# Patient Record
Sex: Male | Born: 1960
Health system: Southern US, Community
[De-identification: ages and names within clinical notes are randomized; demographics above are authoritative.]

## PROBLEM LIST (undated history)

## (undated) DIAGNOSIS — E785 Hyperlipidemia, unspecified: Secondary | ICD-10-CM

## (undated) DIAGNOSIS — E669 Obesity, unspecified: Secondary | ICD-10-CM

## (undated) DIAGNOSIS — K219 Gastro-esophageal reflux disease without esophagitis: Secondary | ICD-10-CM

## (undated) DIAGNOSIS — I10 Essential (primary) hypertension: Secondary | ICD-10-CM

## (undated) DIAGNOSIS — M779 Enthesopathy, unspecified: Secondary | ICD-10-CM

## (undated) DIAGNOSIS — R7303 Prediabetes: Secondary | ICD-10-CM

## (undated) DIAGNOSIS — M1990 Unspecified osteoarthritis, unspecified site: Secondary | ICD-10-CM

## (undated) DIAGNOSIS — G4730 Sleep apnea, unspecified: Secondary | ICD-10-CM

## (undated) DIAGNOSIS — C801 Malignant (primary) neoplasm, unspecified: Secondary | ICD-10-CM

## (undated) DIAGNOSIS — A630 Anogenital (venereal) warts: Secondary | ICD-10-CM

## (undated) DIAGNOSIS — C61 Malignant neoplasm of prostate: Secondary | ICD-10-CM

## (undated) HISTORY — DX: Gastro-esophageal reflux disease without esophagitis: K21.9

## (undated) HISTORY — DX: Essential (primary) hypertension: I10

## (undated) HISTORY — DX: Hyperlipidemia, unspecified: E78.5

## (undated) HISTORY — DX: Unspecified osteoarthritis, unspecified site: M19.90

## (undated) HISTORY — PX: OTHER SURGICAL HISTORY: SHX169

## (undated) HISTORY — DX: Obesity, unspecified: E66.9

## (undated) HISTORY — DX: Enthesopathy, unspecified: M77.9

## (undated) HISTORY — DX: Anogenital (venereal) warts: A63.0

---

## 2003-07-20 ENCOUNTER — Emergency Department (HOSPITAL_COMMUNITY): Admission: EM | Admit: 2003-07-20 | Discharge: 2003-07-20 | Payer: Self-pay | Admitting: Emergency Medicine

## 2003-07-20 ENCOUNTER — Encounter: Payer: Self-pay | Admitting: Emergency Medicine

## 2004-12-12 ENCOUNTER — Ambulatory Visit: Payer: Self-pay | Admitting: Family Medicine

## 2005-01-10 ENCOUNTER — Encounter: Payer: Self-pay | Admitting: Orthopedic Surgery

## 2005-01-13 ENCOUNTER — Ambulatory Visit (HOSPITAL_COMMUNITY): Admission: RE | Admit: 2005-01-13 | Discharge: 2005-01-13 | Payer: Self-pay | Admitting: General Surgery

## 2005-01-23 ENCOUNTER — Ambulatory Visit: Payer: Self-pay | Admitting: Family Medicine

## 2005-03-11 ENCOUNTER — Ambulatory Visit: Payer: Self-pay | Admitting: Family Medicine

## 2005-04-03 ENCOUNTER — Emergency Department (HOSPITAL_COMMUNITY): Admission: EM | Admit: 2005-04-03 | Discharge: 2005-04-03 | Payer: Self-pay | Admitting: Emergency Medicine

## 2005-05-20 ENCOUNTER — Ambulatory Visit: Payer: Self-pay | Admitting: Family Medicine

## 2005-06-24 ENCOUNTER — Emergency Department (HOSPITAL_COMMUNITY): Admission: EM | Admit: 2005-06-24 | Discharge: 2005-06-24 | Payer: Self-pay | Admitting: Emergency Medicine

## 2005-08-05 ENCOUNTER — Ambulatory Visit (HOSPITAL_COMMUNITY): Admission: RE | Admit: 2005-08-05 | Discharge: 2005-08-05 | Payer: Self-pay | Admitting: Family Medicine

## 2005-12-02 ENCOUNTER — Ambulatory Visit: Payer: Self-pay | Admitting: Family Medicine

## 2005-12-09 ENCOUNTER — Ambulatory Visit: Payer: Self-pay | Admitting: Family Medicine

## 2006-01-18 ENCOUNTER — Emergency Department (HOSPITAL_COMMUNITY): Admission: EM | Admit: 2006-01-18 | Discharge: 2006-01-18 | Payer: Self-pay | Admitting: Emergency Medicine

## 2006-01-19 ENCOUNTER — Ambulatory Visit: Payer: Self-pay | Admitting: Family Medicine

## 2006-04-15 ENCOUNTER — Ambulatory Visit: Payer: Self-pay | Admitting: Family Medicine

## 2007-02-03 ENCOUNTER — Ambulatory Visit: Payer: Self-pay | Admitting: Family Medicine

## 2007-02-04 ENCOUNTER — Encounter: Payer: Self-pay | Admitting: Family Medicine

## 2007-02-04 LAB — CONVERTED CEMR LAB
BUN: 17 mg/dL (ref 6–23)
Basophils Absolute: 0 10*3/uL (ref 0.0–0.1)
Basophils Relative: 1 % (ref 0–1)
CO2: 21 meq/L (ref 19–32)
Calcium: 9.3 mg/dL (ref 8.4–10.5)
Chloride: 105 meq/L (ref 96–112)
Cholesterol: 178 mg/dL (ref 0–200)
Creatinine, Ser: 1.16 mg/dL (ref 0.40–1.50)
Eosinophils Absolute: 0.1 10*3/uL (ref 0.0–0.7)
Eosinophils Relative: 1 % (ref 0–5)
Glucose, Bld: 105 mg/dL — ABNORMAL HIGH (ref 70–99)
HCT: 45.7 % (ref 39.0–52.0)
HDL: 35 mg/dL — ABNORMAL LOW (ref >39)
Hemoglobin: 15.5 g/dL (ref 13.0–17.0)
LDL Cholesterol: 117 mg/dL — ABNORMAL HIGH (ref 0–99)
Lymphocytes Relative: 58 % — ABNORMAL HIGH (ref 12–46)
Lymphs Abs: 3.6 10*3/uL — ABNORMAL HIGH (ref 0.7–3.3)
MCHC: 33.9 g/dL (ref 30.0–36.0)
MCV: 94.8 fL (ref 78.0–100.0)
Monocytes Absolute: 0.6 10*3/uL (ref 0.2–0.7)
Monocytes Relative: 10 % (ref 3–11)
Neutro Abs: 2 10*3/uL (ref 1.7–7.7)
Neutrophils Relative %: 31 % — ABNORMAL LOW (ref 43–77)
Platelets: 210 10*3/uL (ref 150–400)
Potassium: 4.3 meq/L (ref 3.5–5.3)
RBC: 4.82 M/uL (ref 4.22–5.81)
RDW: 13 % (ref 11.5–14.0)
Sodium: 141 meq/L (ref 135–145)
Total CHOL/HDL Ratio: 5.1
Triglycerides: 131 mg/dL (ref <150)
VLDL: 26 mg/dL (ref 0–40)
WBC: 6.3 10*3/uL (ref 4.0–10.5)

## 2007-05-14 ENCOUNTER — Emergency Department (HOSPITAL_COMMUNITY): Admission: EM | Admit: 2007-05-14 | Discharge: 2007-05-14 | Payer: Self-pay | Admitting: Emergency Medicine

## 2008-04-27 ENCOUNTER — Ambulatory Visit: Payer: Self-pay | Admitting: Family Medicine

## 2008-04-27 LAB — CONVERTED CEMR LAB
BUN: 20 mg/dL (ref 6–23)
Basophils Absolute: 0 10*3/uL (ref 0.0–0.1)
Basophils Relative: 1 % (ref 0–1)
CO2: 23 meq/L (ref 19–32)
Calcium: 9.2 mg/dL (ref 8.4–10.5)
Chloride: 105 meq/L (ref 96–112)
Cholesterol: 196 mg/dL (ref 0–200)
Creatinine, Ser: 1.18 mg/dL (ref 0.40–1.50)
Eosinophils Absolute: 0 10*3/uL (ref 0.0–0.7)
Eosinophils Relative: 1 % (ref 0–5)
Glucose, Bld: 91 mg/dL (ref 70–99)
HCT: 46.2 % (ref 39.0–52.0)
HDL: 36 mg/dL — ABNORMAL LOW (ref >39)
Hemoglobin: 15.3 g/dL (ref 13.0–17.0)
LDL Cholesterol: 135 mg/dL — ABNORMAL HIGH (ref 0–99)
Lymphocytes Relative: 50 % — ABNORMAL HIGH (ref 12–46)
Lymphs Abs: 3.3 10*3/uL (ref 0.7–4.0)
MCHC: 33.1 g/dL (ref 30.0–36.0)
MCV: 95.1 fL (ref 78.0–100.0)
Monocytes Absolute: 0.7 10*3/uL (ref 0.1–1.0)
Monocytes Relative: 10 % (ref 3–12)
Neutro Abs: 2.5 10*3/uL (ref 1.7–7.7)
Neutrophils Relative %: 39 % — ABNORMAL LOW (ref 43–77)
PSA: 3.46 ng/mL (ref 0.10–4.00)
Platelets: 229 10*3/uL (ref 150–400)
Potassium: 4.2 meq/L (ref 3.5–5.3)
RBC: 4.86 M/uL (ref 4.22–5.81)
RDW: 13.3 % (ref 11.5–15.5)
Sodium: 141 meq/L (ref 135–145)
Total CHOL/HDL Ratio: 5.4
Triglycerides: 125 mg/dL (ref <150)
VLDL: 25 mg/dL (ref 0–40)
WBC: 6.5 10*3/uL (ref 4.0–10.5)

## 2008-05-02 DIAGNOSIS — A630 Anogenital (venereal) warts: Secondary | ICD-10-CM | POA: Insufficient documentation

## 2008-05-02 DIAGNOSIS — M65839 Other synovitis and tenosynovitis, unspecified forearm: Secondary | ICD-10-CM | POA: Insufficient documentation

## 2008-05-02 DIAGNOSIS — E669 Obesity, unspecified: Secondary | ICD-10-CM | POA: Insufficient documentation

## 2008-05-02 DIAGNOSIS — E785 Hyperlipidemia, unspecified: Secondary | ICD-10-CM | POA: Insufficient documentation

## 2008-05-02 DIAGNOSIS — K219 Gastro-esophageal reflux disease without esophagitis: Secondary | ICD-10-CM | POA: Insufficient documentation

## 2008-05-02 DIAGNOSIS — M65849 Other synovitis and tenosynovitis, unspecified hand: Secondary | ICD-10-CM

## 2008-05-20 ENCOUNTER — Emergency Department (HOSPITAL_COMMUNITY): Admission: EM | Admit: 2008-05-20 | Discharge: 2008-05-20 | Payer: Self-pay | Admitting: Emergency Medicine

## 2008-05-22 ENCOUNTER — Ambulatory Visit: Payer: Self-pay | Admitting: Family Medicine

## 2008-05-24 ENCOUNTER — Encounter: Payer: Self-pay | Admitting: Orthopedic Surgery

## 2008-09-17 ENCOUNTER — Ambulatory Visit: Payer: Self-pay | Admitting: Family Medicine

## 2008-09-17 DIAGNOSIS — R0789 Other chest pain: Secondary | ICD-10-CM | POA: Insufficient documentation

## 2008-09-17 DIAGNOSIS — H547 Unspecified visual loss: Secondary | ICD-10-CM | POA: Insufficient documentation

## 2008-09-18 ENCOUNTER — Encounter: Payer: Self-pay | Admitting: Family Medicine

## 2008-09-24 DIAGNOSIS — R079 Chest pain, unspecified: Secondary | ICD-10-CM | POA: Insufficient documentation

## 2008-10-03 ENCOUNTER — Encounter: Payer: Self-pay | Admitting: Family Medicine

## 2008-12-12 ENCOUNTER — Ambulatory Visit: Payer: Self-pay | Admitting: Family Medicine

## 2008-12-27 ENCOUNTER — Ambulatory Visit: Payer: Self-pay | Admitting: Family Medicine

## 2008-12-27 DIAGNOSIS — R51 Headache: Secondary | ICD-10-CM | POA: Insufficient documentation

## 2008-12-27 DIAGNOSIS — R519 Headache, unspecified: Secondary | ICD-10-CM | POA: Insufficient documentation

## 2009-01-02 ENCOUNTER — Encounter: Payer: Self-pay | Admitting: Family Medicine

## 2009-01-12 ENCOUNTER — Emergency Department (HOSPITAL_COMMUNITY): Admission: EM | Admit: 2009-01-12 | Discharge: 2009-01-12 | Payer: Self-pay | Admitting: Emergency Medicine

## 2009-01-16 ENCOUNTER — Ambulatory Visit: Payer: Self-pay | Admitting: Family Medicine

## 2009-01-16 ENCOUNTER — Ambulatory Visit: Payer: Self-pay | Admitting: Orthopedic Surgery

## 2009-01-16 DIAGNOSIS — M25549 Pain in joints of unspecified hand: Secondary | ICD-10-CM | POA: Insufficient documentation

## 2009-01-16 DIAGNOSIS — R1031 Right lower quadrant pain: Secondary | ICD-10-CM | POA: Insufficient documentation

## 2009-01-17 ENCOUNTER — Encounter: Payer: Self-pay | Admitting: Orthopedic Surgery

## 2009-01-18 ENCOUNTER — Ambulatory Visit: Payer: Self-pay | Admitting: Orthopedic Surgery

## 2009-01-22 ENCOUNTER — Telehealth: Payer: Self-pay | Admitting: Orthopedic Surgery

## 2009-01-31 ENCOUNTER — Ambulatory Visit: Payer: Self-pay | Admitting: Orthopedic Surgery

## 2009-03-20 ENCOUNTER — Ambulatory Visit: Payer: Self-pay | Admitting: Orthopedic Surgery

## 2009-05-24 ENCOUNTER — Ambulatory Visit: Payer: Self-pay | Admitting: Family Medicine

## 2009-07-01 ENCOUNTER — Ambulatory Visit: Payer: Self-pay | Admitting: Family Medicine

## 2009-07-02 ENCOUNTER — Encounter: Payer: Self-pay | Admitting: Family Medicine

## 2009-07-02 LAB — CONVERTED CEMR LAB
ALT: 29 units/L (ref 0–53)
AST: 22 units/L (ref 0–37)
Albumin: 4.5 g/dL (ref 3.5–5.2)
Alkaline Phosphatase: 65 units/L (ref 39–117)
BUN: 14 mg/dL (ref 6–23)
Basophils Absolute: 0 10*3/uL (ref 0.0–0.1)
Basophils Relative: 0 % (ref 0–1)
Bilirubin, Direct: 0.2 mg/dL (ref 0.0–0.3)
CO2: 25 meq/L (ref 19–32)
Calcium: 10 mg/dL (ref 8.4–10.5)
Chloride: 105 meq/L (ref 96–112)
Cholesterol: 172 mg/dL (ref 0–200)
Creatinine, Ser: 1 mg/dL (ref 0.40–1.50)
Eosinophils Absolute: 0 10*3/uL (ref 0.0–0.7)
Eosinophils Relative: 0 % (ref 0–5)
Glucose, Bld: 87 mg/dL (ref 70–99)
HCT: 45.9 % (ref 39.0–52.0)
HDL: 37 mg/dL — ABNORMAL LOW (ref >39)
Hemoglobin: 15.4 g/dL (ref 13.0–17.0)
Indirect Bilirubin: 0.6 mg/dL (ref 0.0–0.9)
LDL Cholesterol: 93 mg/dL (ref 0–99)
Lymphocytes Relative: 48 % — ABNORMAL HIGH (ref 12–46)
Lymphs Abs: 3.4 10*3/uL (ref 0.7–4.0)
MCHC: 33.6 g/dL (ref 30.0–36.0)
MCV: 95.4 fL (ref 78.0–100.0)
Monocytes Absolute: 0.7 10*3/uL (ref 0.1–1.0)
Monocytes Relative: 11 % (ref 3–12)
Neutro Abs: 2.8 10*3/uL (ref 1.7–7.7)
Neutrophils Relative %: 41 % — ABNORMAL LOW (ref 43–77)
PSA: 3.49 ng/mL (ref 0.10–4.00)
Platelets: 224 10*3/uL (ref 150–400)
Potassium: 4.8 meq/L (ref 3.5–5.3)
RBC: 4.81 M/uL (ref 4.22–5.81)
RDW: 13.4 % (ref 11.5–15.5)
Sodium: 143 meq/L (ref 135–145)
TSH: 1.344 microintl units/mL (ref 0.350–4.500)
Total Bilirubin: 0.8 mg/dL (ref 0.3–1.2)
Total CHOL/HDL Ratio: 4.6
Total Protein: 7.2 g/dL (ref 6.0–8.3)
Triglycerides: 209 mg/dL — ABNORMAL HIGH (ref <150)
VLDL: 42 mg/dL — ABNORMAL HIGH (ref 0–40)
WBC: 7 10*3/uL (ref 4.0–10.5)

## 2009-07-08 DIAGNOSIS — K5289 Other specified noninfective gastroenteritis and colitis: Secondary | ICD-10-CM | POA: Insufficient documentation

## 2009-07-22 ENCOUNTER — Ambulatory Visit: Payer: Self-pay | Admitting: Orthopedic Surgery

## 2009-07-22 DIAGNOSIS — M79609 Pain in unspecified limb: Secondary | ICD-10-CM | POA: Insufficient documentation

## 2009-07-22 DIAGNOSIS — M722 Plantar fascial fibromatosis: Secondary | ICD-10-CM | POA: Insufficient documentation

## 2009-10-03 ENCOUNTER — Ambulatory Visit: Payer: Self-pay | Admitting: Family Medicine

## 2009-10-03 LAB — CONVERTED CEMR LAB: Creatinine, Ser: 1.05 mg/dL (ref 0.40–1.50)

## 2009-10-14 ENCOUNTER — Ambulatory Visit: Payer: Self-pay | Admitting: Orthopedic Surgery

## 2009-10-23 ENCOUNTER — Encounter (INDEPENDENT_AMBULATORY_CARE_PROVIDER_SITE_OTHER): Payer: Self-pay | Admitting: *Deleted

## 2009-11-14 ENCOUNTER — Ambulatory Visit: Payer: Self-pay | Admitting: Internal Medicine

## 2009-11-19 ENCOUNTER — Ambulatory Visit (HOSPITAL_COMMUNITY): Admission: RE | Admit: 2009-11-19 | Discharge: 2009-11-19 | Payer: Self-pay | Admitting: Internal Medicine

## 2009-11-21 ENCOUNTER — Encounter (INDEPENDENT_AMBULATORY_CARE_PROVIDER_SITE_OTHER): Payer: Self-pay | Admitting: *Deleted

## 2009-11-25 ENCOUNTER — Encounter: Payer: Self-pay | Admitting: Internal Medicine

## 2009-11-27 ENCOUNTER — Ambulatory Visit (HOSPITAL_COMMUNITY): Admission: RE | Admit: 2009-11-27 | Discharge: 2009-11-27 | Payer: Self-pay | Admitting: Internal Medicine

## 2009-11-27 ENCOUNTER — Ambulatory Visit: Payer: Self-pay | Admitting: Internal Medicine

## 2009-11-29 ENCOUNTER — Encounter: Payer: Self-pay | Admitting: Internal Medicine

## 2009-12-03 ENCOUNTER — Encounter: Payer: Self-pay | Admitting: Internal Medicine

## 2009-12-10 ENCOUNTER — Encounter: Payer: Self-pay | Admitting: Internal Medicine

## 2009-12-13 ENCOUNTER — Ambulatory Visit (HOSPITAL_COMMUNITY): Admission: RE | Admit: 2009-12-13 | Discharge: 2009-12-13 | Payer: Self-pay | Admitting: General Surgery

## 2009-12-13 HISTORY — PX: APPENDECTOMY: SHX54

## 2010-02-03 ENCOUNTER — Ambulatory Visit: Payer: Self-pay | Admitting: Family Medicine

## 2010-02-03 DIAGNOSIS — R5383 Other fatigue: Secondary | ICD-10-CM

## 2010-02-03 DIAGNOSIS — R5381 Other malaise: Secondary | ICD-10-CM | POA: Insufficient documentation

## 2010-04-14 ENCOUNTER — Telehealth: Payer: Self-pay | Admitting: Family Medicine

## 2010-05-19 ENCOUNTER — Telehealth: Payer: Self-pay | Admitting: Family Medicine

## 2010-05-21 ENCOUNTER — Encounter: Payer: Self-pay | Admitting: Orthopedic Surgery

## 2010-06-25 ENCOUNTER — Encounter: Payer: Self-pay | Admitting: Family Medicine

## 2010-06-25 ENCOUNTER — Emergency Department (HOSPITAL_COMMUNITY): Admission: EM | Admit: 2010-06-25 | Discharge: 2010-06-25 | Payer: Self-pay | Admitting: Emergency Medicine

## 2010-07-01 ENCOUNTER — Ambulatory Visit: Payer: Self-pay | Admitting: Family Medicine

## 2010-07-01 DIAGNOSIS — M549 Dorsalgia, unspecified: Secondary | ICD-10-CM | POA: Insufficient documentation

## 2010-07-02 LAB — CONVERTED CEMR LAB
BUN: 18 mg/dL (ref 6–23)
Basophils Absolute: 0 K/uL (ref 0.0–0.1)
Basophils Relative: 1 % (ref 0–1)
CO2: 27 meq/L (ref 19–32)
Calcium: 9 mg/dL (ref 8.4–10.5)
Chloride: 105 meq/L (ref 96–112)
Cholesterol: 177 mg/dL (ref 0–200)
Creatinine, Ser: 1.05 mg/dL (ref 0.40–1.50)
Eosinophils Absolute: 0 K/uL (ref 0.0–0.7)
Eosinophils Relative: 1 % (ref 0–5)
Glucose, Bld: 97 mg/dL (ref 70–99)
HCT: 42.9 % (ref 39.0–52.0)
HDL: 33 mg/dL — ABNORMAL LOW (ref >39)
Hemoglobin: 14.5 g/dL (ref 13.0–17.0)
LDL Cholesterol: 124 mg/dL — ABNORMAL HIGH (ref 0–99)
Lymphocytes Relative: 59 % — ABNORMAL HIGH (ref 12–46)
Lymphs Abs: 2.7 K/uL (ref 0.7–4.0)
MCHC: 33.8 g/dL (ref 30.0–36.0)
MCV: 95.5 fL (ref 78.0–100.0)
Monocytes Absolute: 0.5 K/uL (ref 0.1–1.0)
Monocytes Relative: 11 % (ref 3–12)
Neutro Abs: 1.3 K/uL — ABNORMAL LOW (ref 1.7–7.7)
Neutrophils Relative %: 29 % — ABNORMAL LOW (ref 43–77)
PSA: 3.24 ng/mL (ref 0.10–4.00)
Platelets: 228 K/uL (ref 150–400)
Potassium: 4.3 meq/L (ref 3.5–5.3)
RBC: 4.49 M/uL (ref 4.22–5.81)
RDW: 12.9 % (ref 11.5–15.5)
Sodium: 142 meq/L (ref 135–145)
TSH: 0.81 u[IU]/mL (ref 0.350–4.500)
Total CHOL/HDL Ratio: 5.4
Triglycerides: 101 mg/dL (ref <150)
VLDL: 20 mg/dL (ref 0–40)
Vit D, 25-Hydroxy: 20 ng/mL — ABNORMAL LOW (ref 30–89)
WBC: 4.5 10*3/microliter (ref 4.0–10.5)

## 2010-07-03 ENCOUNTER — Telehealth: Payer: Self-pay | Admitting: Family Medicine

## 2010-08-05 ENCOUNTER — Emergency Department (HOSPITAL_COMMUNITY): Admission: EM | Admit: 2010-08-05 | Discharge: 2010-08-05 | Payer: Self-pay | Admitting: Emergency Medicine

## 2010-08-06 ENCOUNTER — Telehealth (INDEPENDENT_AMBULATORY_CARE_PROVIDER_SITE_OTHER): Payer: Self-pay | Admitting: *Deleted

## 2010-08-12 ENCOUNTER — Encounter: Payer: Self-pay | Admitting: Family Medicine

## 2010-08-15 ENCOUNTER — Encounter: Payer: Self-pay | Admitting: Family Medicine

## 2010-08-15 HISTORY — PX: CHOLECYSTECTOMY: SHX55

## 2010-08-19 ENCOUNTER — Ambulatory Visit: Payer: Self-pay | Admitting: Family Medicine

## 2010-11-23 ENCOUNTER — Encounter: Payer: Self-pay | Admitting: Family Medicine

## 2010-11-30 LAB — CONVERTED CEMR LAB
Cholesterol: 175 mg/dL (ref 0–200)
HDL: 31 mg/dL — ABNORMAL LOW (ref >39)
LDL Cholesterol: 110 mg/dL — ABNORMAL HIGH (ref 0–99)
Total CHOL/HDL Ratio: 5.6
Triglycerides: 170 mg/dL — ABNORMAL HIGH (ref <150)
VLDL: 34 mg/dL (ref 0–40)

## 2010-12-02 NOTE — Assessment & Plan Note (Signed)
Summary: ABD PAIN/SS   Visit Type:  Initial Consult Primary Care Provider:  simpson  Chief Complaint:  abd pain x several months, no N/V, and no diarrhea/constipation.  History of Present Illness: 50 year old African American male referred her to further evaluate a six-month history of right lower quadrant abdominal pain. It waxes and wanes; there is no radiating component. He may awaken with it the morning - May last couple of hours and then it settled down;  not associated with bowel function or eating. There is no positional component. He's had no nausea or vomiting. No associated flank pain or urinary tract symptoms. He has 1-3 formed bowel movement is daily denies melena or hematochezia. He has not lost any weight.  He has a greater than 10 year history of chronic gastroesophageal reflux  disease symptoms; he describes heartburn. He tells me he was started on Prevacid which has been changed to omeprazole, with notabley,  marked improvement in his symptoms. He does not have any alarm symptoms associated with reflux including odynophagia or dysphagia. There is no family history colon cancer. He tells me his father said multiple colonoscopy for colonic polyps beginning at about age 56; he has never had his lower GI tract tract evaluated. He has a history of condyloma acuminata (perianal);  treated by  Dr. Suan Halter. He has had normal hemoglobin and total white count  through Dr. Anthony Sar office although he did have a slightly elevated lymphocyte count on a couple of occasions.  Current Problems (verified): 1)  Foot Pain, Left  (ICD-729.5) 2)  Plantar Faciitis  (ICD-728.71) 3)  Gastroenteritis  (ICD-558.9) 4)  Abdominal Pain Right Lower Quadrant  (ICD-789.03) 5)  Wrist Pain  (ICD-719.44) 6)  Family History of Diabetes  (ICD-V18.0) 7)  Headache  (ICD-784.0) 8)  Chest Pain Unspecified  (ICD-786.50) 9)  Visual Acuity, Decreased  (ICD-369.9) 10)  Other Chest Pain  (ICD-786.59) 11)  Condyloma  Acuminata  (ICD-078.11) 12)  Gerd  (ICD-530.81) 13)  Hyperlipidemia  (ICD-272.4) 14)  Obesity  (ICD-278.00) 15)  Tendinitis, Left Hand  (ICD-727.05)  Current Medications (verified): 1)  Adult Aspirin Ec Low Strength 81 Mg Tbec (Aspirin) .... Take 1 Tablet By Mouth Once A Day 2)  Vitamin C 500 Mg Tabs (Ascorbic Acid) .... Take 1 Tablet By Mouth Once A Day 3)  Centrum Ultra Mens  Tabs (Multiple Vitamins-Minerals) .... Take 1 Tablet By Mouth Once A Day 4)  Dicyclomine Hcl 20 Mg Tabs (Dicyclomine Hcl) .... Take 1 Tablet By Mouth Two Times A Day As Needed 5)  Omeprazole 20 Mg Cpdr (Omeprazole) .... Take 1 Capsule By Mouth Once A Day 6)  Ultracet 37.5-325 Mg Tabs (Tramadol-Acetaminophen) .Marland Kitchen.. 1 By Mouth Q 4 As Needed  Allergies (verified): No Known Drug Allergies  Past History:  Past Medical History: Last updated: 05/02/2008 Current Problems:  CONDYLOMA ACUMINATA (ICD-078.11) GERD (ICD-530.81) HYPERLIPIDEMIA (ICD-272.4) OBESITY (ICD-278.00) TENDINITIS, LEFT HAND (ICD-727.05)  Past Surgical History: Last updated: 01/16/2009 CTR right hand  Family History: Last updated: 01/16/2009 Mother living - DM, HTN Father living - reflux dz Five sisters living - x1 DM 2 brothers living - healthy FH of Cancer:  Family History of Diabetes  Social History: Last updated: 01/16/2009 Employed [employer] Single Never Smoked Alcohol use-occasional Drug use-no  Risk Factors: Caffeine Use: 0 (01/16/2009)  Risk Factors: Smoking Status: never (01/16/2009)  Vital Signs:  Patient profile:   50 year old male Height:      67 inches Weight:  217 pounds BMI:     34.11 Temp:     98.2 degrees F oral Pulse rate:   80 / minute BP sitting:   112 / 80  (left arm) Cuff size:   regular  Vitals Entered By: Hendricks Limes LPN (November 14, 2009 3:06 PM)  Physical Exam  General:  alert conversant and in no acute distress Lungs:  clear to auscultation Heart:  regular rate rhythm without  murmur gallop rub Abdomen:  nondistended positive bowel sounds soft there is some tenderness to deep right lower quadrant to palpation do not appreciate any mass there is no guarding or rebound I didn't appreciate any organomegaly or hernia. Back has no CVA tenderness Rectal:  no perianal lesions good sphincter tone and scant brown stool in rectal vault no mass Hemoccult negative  Impression & Recommendations: Impression: Pleasant 50 year old gentleman with intermittent right lower quadrant pain x6 months. Pain really does not have any associations with bowel function or  oral intake. I certainly do not detect a hernia on today's examination. Differential diagnosis remains somewhat broad at this point in time.  He has long-standing gastroesophageal reflux disease symptoms are well controlled on acid suppression therapy.  Recommendations: Contrast CT of the abdomen and pelvis to further evaluate right lower quadrant abdominal pain. Further recommendations to follow.  Depending on findings of CT, I will likely also the patient a screening colonoscopy as well as EGD given a longer standing gastroesophageal reflux disease symptoms discussed this approach with the patient along with the associated risks, benefits, limitations, and alternatives today. His questions have been answered  Again, further recommendations to follow.  Other Orders: Consultation Level IV (84166) Hemoccult Guaiac-1 spec.(in office) (82270)

## 2010-12-02 NOTE — Progress Notes (Signed)
Summary: referral on mom  Phone Note Call from Patient   Summary of Call: called pt and left message that mother had appt with dr. Kristian Covey on 06/25/2010 11:00.  Initial call taken by: Rudene Anda,  May 19, 2010 8:16 AM

## 2010-12-02 NOTE — Progress Notes (Signed)
  Phone Note Other Incoming   Caller: dr simpson Summary of Call: pls contact Kaylon Buckner, let him know i reviewwed the scan done in the ED, he needs to a surgeon because he has gallstones, if he has abdominal pain and dyspepsia on and off, i suggest Dr Lovell Sheehan, pls set up appt with Dr Lovell Sheehan and fax over the report. He works most days so you will have to leave a message for him to call you  Initial call taken by: Syliva Overman MD,  July 03, 2010 8:40 AM  Follow-up for Phone Call        Patient aware.  faxed report and referral form reqesting appointment Follow-up by: Everitt Amber LPN,  July 03, 2010 3:38 PM

## 2010-12-02 NOTE — Assessment & Plan Note (Signed)
Summary: physical   Vital Signs:  Patient profile:   50 year old male Height:      67 inches Weight:      213.25 pounds BMI:     33.52 O2 Sat:      99 % on Room air Pulse rate:   112 / minute Pulse rhythm:   regular Resp:     16 per minute BP sitting:   122 / 76  (left arm)  Vitals Entered By: Mauricia Area CMA (August 19, 2010 2:56 PM)  Nutrition Counseling: Patient's BMI is greater than 25 and therefore counseled on weight management options.  O2 Flow:  Room air CC: physical   Primary Care Carmelo Reidel:  simpson  CC:  physical.  History of Present Illness: Reports  that he has not been doing too well. He is still experiencing fatigue since his recent appendectomy. Denies recent fever or chills. Denies sinus pressure, nasal congestion , ear pain or sore throat. Denies chest congestion, or cough productive of sputum. Denies chest pain, palpitations, PND, orthopnea or leg swelling. C/o incision site abdominal pain,denies  nausea, vomitting, diarrhea or constipation. Denies change in bowel movements or bloody stool. Denies dysuria , frequency, incontinence or hesitancy. Denies  joint pain, swelling, or reduced mobility. Denies headaches, vertigo, seizures. Denies depression, anxiety or insomnia. Denies  rash, lesions, or itch.     Current Medications (verified): 1)  Adult Aspirin Ec Low Strength 81 Mg Tbec (Aspirin) .... Take 1 Tablet By Mouth Once A Day 2)  Vitamin C 500 Mg Tabs (Ascorbic Acid) .... Take 1 Tablet By Mouth Once A Day 3)  Centrum Ultra Mens  Tabs (Multiple Vitamins-Minerals) .... Take 1 Tablet By Mouth Once A Day 4)  Omeprazole 20 Mg Cpdr (Omeprazole) .... Take 1 Capsule By Mouth Once A Day 5)  Cephalexin 500 Mg Caps (Cephalexin) .Marland Kitchen.. 1 Cap Three Times A Day 6)  Hydrocodone-Acetaminophen 5-500 Mg Tabs (Hydrocodone-Acetaminophen) .Marland Kitchen.. 1 Tab Every 4-6 Hours For Pain  Allergies (verified): No Known Drug Allergies  Past History:  Past medical, surgical,  family and social histories (including risk factors) reviewed, and no changes noted (except as noted below).  Past Medical History: Reviewed history from 05/02/2008 and no changes required. Current Problems:  CONDYLOMA ACUMINATA (ICD-078.11) GERD (ICD-530.81) HYPERLIPIDEMIA (ICD-272.4) OBESITY (ICD-278.00) TENDINITIS, LEFT HAND (ICD-727.05)  Past Surgical History: CTR right hand appendectomy 12/13/2009 cholecystectomy 08/15/2010 de [facility]  Family History: Reviewed history from 01/16/2009 and no changes required. Mother living - DM, HTN Father living - reflux dz Five sisters living - x1 DM 2 brothers living - healthy FH of Cancer:  Family History of Diabetes  Social History: Reviewed history from 01/16/2009 and no changes required. Employed Research scientist (medical) Single Never Smoked Alcohol use-occasional Drug use-no  Review of Systems      See HPI General:  Complains of fatigue, loss of appetite, and malaise. Eyes:  Denies blurring and discharge. Endo:  Denies excessive thirst and excessive urination. Heme:  Denies abnormal bruising and bleeding. Allergy:  Denies hives or rash and itching eyes.  Physical Exam  General:  Well-developed,well-nourished,in no acute distress; alert,appropriate and cooperative throughout examination HEENT: No facial asymmetry,  EOMI, No sinus tenderness, TM's Clear, oropharynx  pink and moist.   Chest: Clear to auscultation bilaterally.  CVS: S1, S2, No murmurs, No S3.   Abd: Soft, Nontender.  MS: Adequate ROM spine, hips, shoulders and knees.  Ext: No edema.   CNS: CN 2-12 intact, power tone and sensation normal throughout.  Skin: Intact, surgical incision slightly eryuthematous, no evidence of infection, no drainage, wound still healing Psych: Good eye contact, normal affect.  Memory intact, not anxious or depressed appearing.    Impression & Recommendations:  Problem # 1:  HYPERLIPIDEMIA (ICD-272.4) Assessment Comment Only  Labs  Reviewed: SGOT: 22 (07/01/2009)   SGPT: 29 (07/01/2009)   HDL:33 (07/01/2010), 37 (07/01/2009)  LDL:124 (07/01/2010), 93 (16/08/9603)  Chol:177 (07/01/2010), 172 (07/01/2009)  Trig:101 (07/01/2010), 209 (07/01/2009) Low fat dietdiscussed and encouraged  Problem # 2:  OBESITY (ICD-278.00) Assessment: Improved  Ht: 67 (08/19/2010)   Wt: 213.25 (08/19/2010)   BMI: 33.52 (08/19/2010) therapeutic lifestyle change discussed and encouraged  Problem # 3:  GERD (ICD-530.81) Assessment: Improved  His updated medication list for this problem includes:    Omeprazole 20 Mg Cpdr (Omeprazole) .Marland Kitchen... Take 1 capsule by mouth once a day  Complete Medication List: 1)  Adult Aspirin Ec Low Strength 81 Mg Tbec (Aspirin) .... Take 1 tablet by mouth once a day 2)  Vitamin C 500 Mg Tabs (Ascorbic acid) .... Take 1 tablet by mouth once a day 3)  Centrum Ultra Mens Tabs (Multiple vitamins-minerals) .... Take 1 tablet by mouth once a day 4)  Omeprazole 20 Mg Cpdr (Omeprazole) .... Take 1 capsule by mouth once a day 5)  Cephalexin 500 Mg Caps (Cephalexin) .Marland Kitchen.. 1 cap three times a day 6)  Hydrocodone-acetaminophen 5-500 Mg Tabs (Hydrocodone-acetaminophen) .Marland Kitchen.. 1 tab every 4-6 hours for pain  Other Orders: Tdap => 55yrs IM (54098) Admin 1st Vaccine (11914)  Patient Instructions: 1)  CPE in 4.5 months. 2)  Pls complete the antibiotic course as per Dr Gabriel Cirri, also you can take the pain meds as needed, should be reducing the dose. 3)  Fluidsas much as tolerated, increase food as tolerated, increase activity.   Orders Added: 1)  Est. Patient Level III [78295] 2)  Tdap => 52yrs IM [90715] 3)  Admin 1st Vaccine [62130]   Immunizations Administered:  Tetanus Vaccine:    Vaccine Type: Tdap    Site: left deltoid    Mfr: GlaxoSmithKline    Dose: 0.5 ml    Route: IM    Given by: Adella Hare LPN    Exp. Date: 08/21/2012    Lot #: [id number]    VIS given: 09/20/07 version given August 19, 2010.   Immunizations Administered:  Tetanus Vaccine:    Vaccine Type: Tdap    Site: left deltoid    Mfr: GlaxoSmithKline    Dose: 0.5 ml    Route: IM    Given by: Adella Hare LPN    Exp. Date: 08/21/2012    Lot #: [id number]    VIS given: 09/20/07 version given August 19, 2010.

## 2010-12-02 NOTE — Letter (Signed)
Summary: CT ABD/PELVIS ORDER  CT ABD/PELVIS ORDER   Imported By: Ave Filter 11/14/2009 17:16:29  _____________________________________________________________________  External Attachment:    Type:   Image     Comment:   External Document

## 2010-12-02 NOTE — Op Note (Signed)
Summary: Operative Report RT wrist Ganglion cyst  Operative Report RT wrist Ganglion cyst   Imported By: Cammie Sickle 05/27/2010 12:17:54  _____________________________________________________________________  External Attachment:    Type:   Image     Comment:   External Document

## 2010-12-02 NOTE — Letter (Signed)
Summary: Out of Work Note  [facility]  [address]   [place], [place] [address]   Phone: [phone]  Fax: [phone]    11/21/2009  TO: Leodis Sias IT MAY CONCERN  RE: Anthony Erickson [address] [place] [birth year]       The above named individual is currently under my care and will be out of work    FROM: 11/26/2009   THROUGH: 11/28/2009      REASON: Will be unable to work.    MAY RETURN ON: 11/29/2009     If you have any further questions or need additional information, please call.     Sincerely,     [facility] R. Roetta Sessions, M.D.    Kassie Mends, M.D. Lorenza Burton, FNP-BC    Tana Coast, PA-C Phone: [phone]    Fax: [phone]

## 2010-12-02 NOTE — Assessment & Plan Note (Signed)
Summary: F UP FROM ED BACK   Vital Signs:  Patient profile:   50 year old male Height:      67 inches Weight:      221.50 pounds BMI:     34.82 O2 Sat:      99 % on Room air Pulse rate:   76 / minute Pulse rhythm:   regular Resp:     16 per minute BP sitting:   112 / 80  (left arm)  Vitals Entered By: Adella Hare LPN (July 01, 2010 11:11 AM)  Nutrition Counseling: Patient's BMI is greater than 25 and therefore counseled on weight management options.  O2 Flow:  Room air CC: er follow up back pain Is Patient Diabetic? No Pain Assessment Patient in pain? yes     Location: back Intensity: 7 Type: aching Onset of pain  Constant Comments did not bring meds to ov   Primary Care Provider:  simpson  CC:  er follow up back pain.  History of Present Illness: Intermittent left lumbar pain x 2 months. Can't get comfortable at night in the bed, has  to sleep on left, sitting is uncomfortable, most comfoertable in a recliner, sytanding and walking less painful.Bending and twisting aggravate the pain. Sitting in a chair the pain is a 7. No relief noted. Was in the Ed 1 week ago, did not fill the script, states pain is a 7 and has been really bad in the last 3 days  rx was for ibuoprofen 400 to 600mg  3 times daily,hydrocodne 5/325 1 every 4 to 6 hr, valim but will d/c this, and replace with flexeril. Reports  that he is otherwise doing well. Denies recent fever or chills. Denies sinus pressure, nasal congestion , ear pain or sore throat. Denies chest congestion, or cough productive of sputum. Denies chest pain, palpitations, PND, orthopnea or leg swelling. Denies abdominal pain, nausea, vomitting, diarrhea or constipation. Denies change in bowel movements or bloody stool. Denies dysuria , frequency, incontinence or hesitancy.  Denies headaches, vertigo, seizures. Denies depression, anxiety or insomnia. Denies  rash, lesions, or itch.     Current Medications (verified): 1)   Adult Aspirin Ec Low Strength 81 Mg Tbec (Aspirin) .... Take 1 Tablet By Mouth Once A Day 2)  Vitamin C 500 Mg Tabs (Ascorbic Acid) .... Take 1 Tablet By Mouth Once A Day 3)  Centrum Ultra Mens  Tabs (Multiple Vitamins-Minerals) .... Take 1 Tablet By Mouth Once A Day 4)  Omeprazole 20 Mg Cpdr (Omeprazole) .... Take 1 Capsule By Mouth Once A Day  Allergies (verified): No Known Drug Allergies  Review of Systems      See HPI General:  Complains of fatigue. Eyes:  Denies blurring and discharge. Endo:  Denies excessive thirst and excessive urination. Heme:  Denies abnormal bruising and bleeding. Allergy:  Denies hives or rash and itching eyes.  Physical Exam  General:  alert, well-hydrated, and overweight-appearing.  HEENT: No facial asymmetry,  EOMI, No sinus tenderness, TM's Clear, oropharynx  pink and moist.   Chest: Clear to auscultation bilaterally.  CVS: S1, S2, No murmurs, No S3.   Abd: sopft , nonm tender MS: decreased  ROM thoracolumbar spine,adequate  in  hips, shoulders and knees.  Ext: No edema.   CNS: CN 2-12 intact, power tone and sensation normal throughout.   Skin: Intact, no visible lesions or rashes.  Psych: Good eye contact, normal affect.  Memory intact, not anxious or depressed appearing.     Impression &  Recommendations:  Problem # 1:  BACK PAIN (ICD-724.5) Assessment Deteriorated  The following medications were removed from the medication list:    Ultracet 37.5-325 Mg Tabs (Tramadol-acetaminophen) .Marland Kitchen... 1 by mouth q 4 as needed His updated medication list for this problem includes:    Adult Aspirin Ec Low Strength 81 Mg Tbec (Aspirin) .Marland Kitchen... Take 1 tablet by mouth once a day    Cyclobenzaprine Hcl 10 Mg Tabs (Cyclobenzaprine hcl) .Marland Kitchen... Take 1 tablet by mouth three times a day as needed  Orders: Physical Therapy Referral (PT) Depo- Medrol 80mg  (J1040) Ketorolac-Toradol 15mg  (E4540) Admin of Therapeutic Inj  intramuscular or subcutaneous  (98119)  Problem # 2:  HYPERLIPIDEMIA (ICD-272.4) Assessment: Comment Only  Labs Reviewed: SGOT: 22 (07/01/2009)   SGPT: 29 (07/01/2009)   HDL:37 (07/01/2009), 31 (09/18/2008)  LDL:93 (07/01/2009), 110 (14/78/2956)  Chol:172 (07/01/2009), 175 (09/18/2008)  Trig:209 (07/01/2009), 170 (09/18/2008) low fat diet discussed and encouraged  Problem # 3:  OBESITY (ICD-278.00) Assessment: Unchanged  Ht: 67 (07/01/2010)   Wt: 221.50 (07/01/2010)   BMI: 34.82 (07/01/2010)  Complete Medication List: 1)  Adult Aspirin Ec Low Strength 81 Mg Tbec (Aspirin) .... Take 1 tablet by mouth once a day 2)  Vitamin C 500 Mg Tabs (Ascorbic acid) .... Take 1 tablet by mouth once a day 3)  Centrum Ultra Mens Tabs (Multiple vitamins-minerals) .... Take 1 tablet by mouth once a day 4)  Omeprazole 20 Mg Cpdr (Omeprazole) .... Take 1 capsule by mouth once a day 5)  Cyclobenzaprine Hcl 10 Mg Tabs (Cyclobenzaprine hcl) .... Take 1 tablet by mouth three times a day as needed  Patient Instructions: 1)  F/U as before. 2)  Pls taske prescriptions per the Ed.(except for the valium) 3)  you will benefit from physical therapy and I will refer you as discussed. 4)  No valium instead cycobenzaprine, be careful of sedation Prescriptions: CYCLOBENZAPRINE HCL 10 MG TABS (CYCLOBENZAPRINE HCL) Take 1 tablet by mouth three times a day as needed  #30 x 0   Entered and Authorized by:   Syliva Overman MD   Signed by:   Syliva Overman MD on 07/01/2010   Method used:   Printed then faxed to ...       [facility] (retail)       [address]       [place], [place]  [address]       Ph: [phone]       Fax: [phone]   RxID:   [id number]    Medication Administration  Injection # 1:    Medication: Depo- Medrol 80mg     Diagnosis: BACK PAIN (ICD-724.5)    Route: IM    Site: RUOQ gluteus    Exp Date: 4/12    Lot #: [id number]    Mfr: [employer]    Patient tolerated  injection without complications    Given by: Adella Hare LPN (July 01, 2010 11:48 AM)  Injection # 2:    Medication: Ketorolac-Toradol 15mg     Diagnosis: BACK PAIN (ICD-724.5)    Route: IM    Site: LUOQ gluteus    Exp Date: 01/01/2012    Lot #: [id number]    Mfr: [email or web address]    Comments: toradol 60mg  given    Patient tolerated injection without complications    Given by: Adella Hare LPN (July 01, 2010 11:49 AM)  Orders Added: 1)  Est. Patient Level IV [75643] 2)  Physical Therapy Referral [PT] 3)  Depo- Medrol 80mg  [J1040] 4)  Ketorolac-Toradol 15mg  [J1885] 5)  Admin of Therapeutic Inj  intramuscular or subcutaneous [16109]

## 2010-12-02 NOTE — Assessment & Plan Note (Signed)
Summary: office visit   Vital Signs:  Patient profile:   50 year old male Height:      67 inches Weight:      220.75 pounds BMI:     34.70 O2 Sat:      99 % Pulse rate:   87 / minute Pulse rhythm:   regular Resp:     16 per minute BP sitting:   114 / 74  (left arm) Cuff size:   large  Vitals Entered By: Everitt Amber LPN (February 03, 1609 4:30 PM)  Nutrition Counseling: Patient's BMI is greater than 25 and therefore counseled on weight management options. CC: Follow up chronic problems   Primary Care Provider:  Orva Gwaltney  CC:  Follow up chronic problems.  History of Present Illness: pt  is concerned about small renal cysts and wants this fololowed , he is otherwise doing well. Heis concerned about weight gain and intends to lose this by lifestyle change. since his last visit he had appendectome and reports resolution of his pain.   Denies recent fever or chills. Denies sinus pressure, nasal congestion , ear pain or sore throat. Denies chest congestion, or cough productive of sputum. Denies chest pain, palpitations, PND, orthopnea or leg swelling. Denies abdominal pain, nausea, vomitting, diarrhea or constipation. Denies change in bowel movements or bloody stool. Denies dysuria , frequency, incontinence or hesitancy. Denies  joint pain, swelling, or reduced mobility. Denies headaches, vertigo, seizures. Denies depression, anxiety or insomnia. Denies  rash, lesions, or itch.     Allergies: No Known Drug Allergies  Past History:  Past medical, surgical, family and social histories (including risk factors) reviewed, and no changes noted (except as noted below).  Past Medical History: Reviewed history from 05/02/2008 and no changes required. 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 Mother living - DM, HTN Father living - reflux dz Five sisters living - x1 DM 2 brothers living - healthy FH of Cancer:  Family History of Diabetes  Social History: Reviewed history from 01/16/2009 and no changes required. Employed Research scientist (medical) Single Never Smoked Alcohol use-occasional Drug use-no  Review of Systems      See HPI Eyes:  Denies blurring and discharge. Endo:  Denies cold intolerance, excessive hunger, excessive thirst, excessive urination, heat intolerance, polyuria, and weight change. Heme:  Denies abnormal bruising and bleeding. Allergy:  Denies seasonal allergies.  Physical Exam  General:  alert, well-hydrated, and overweight-appearing.  HEENT: No facial asymmetry,  EOMI, No sinus tenderness, TM's Clear, oropharynx  pink and moist.   Chest: Clear to auscultation bilaterally.  CVS: S1, S2, No murmurs, No S3.   Abd: sopft , nonm tender MS: Adequate ROM spine, hips, shoulders and knees.  Ext: No edema.   CNS: CN 2-12 intact, power tone and sensation normal throughout.   Skin: Intact, no visible lesions or rashes.  Psych: Good eye contact, normal affect.  Memory intact, not anxious or depressed appearing.     Impression & Recommendations:  Problem # 1:  ABDOMINAL PAIN RIGHT LOWER QUADRANT (ICD-789.03) Assessment Improved  Problem # 2:  GERD (ICD-530.81) Assessment: Unchanged  His updated medication list for this problem includes:    Dicyclomine Hcl 20 Mg Tabs (Dicyclomine hcl) .Marland Kitchen... Take 1 tablet by mouth two times a day as needed    Omeprazole 20 Mg  Cpdr (Omeprazole) .Marland Kitchen... Take 1 capsule by mouth once a day now awre that he has hiatal hernia which is small, I reassured him that this was nota cause of major concern  Problem # 3:  OBESITY (ICD-278.00) Assessment: Deteriorated  Ht: 67 (02/03/2010)   Wt: 220.75 (02/03/2010)   BMI: 34.70 (02/03/2010)  Problem # 4:  HYPERLIPIDEMIA  (ICD-272.4) Assessment: Comment Only  Orders: T-Lipid Profile ([id number])  Labs Reviewed: SGOT: 22 (07/01/2009)   SGPT: 29 (07/01/2009)   HDL:37 (07/01/2009), 31 (09/18/2008)  LDL:93 (07/01/2009), 110 (09/81/1914)  Chol:172 (07/01/2009), 175 (09/18/2008)  Trig:209 (07/01/2009), 170 (09/18/2008)  Complete Medication List: 1)  Adult Aspirin Ec Low Strength 81 Mg Tbec (Aspirin) .... Take 1 tablet by mouth once a day 2)  Vitamin C 500 Mg Tabs (Ascorbic acid) .... Take 1 tablet by mouth once a day 3)  Centrum Ultra Mens Tabs (Multiple vitamins-minerals) .... Take 1 tablet by mouth once a day 4)  Dicyclomine Hcl 20 Mg Tabs (Dicyclomine hcl) .... Take 1 tablet by mouth two times a day as needed 5)  Omeprazole 20 Mg Cpdr (Omeprazole) .... Take 1 capsule by mouth once a day 6)  Ultracet 37.5-325 Mg Tabs (Tramadol-acetaminophen) .Marland Kitchen.. 1 by mouth q 4 as needed  Other Orders: T-Basic Metabolic Panel [id number]) T-TSH [id number]) T-CBC w/Diff [id number]) T-PSA [phone]) T-Vitamin D (25-Hydroxy) [phone])  Patient Instructions: 1)  Please schedule a  cPE forst week in Sept 2)  It is important that you exercise regularly at least 20 minutes 5 times a week. If you develop chest pain, have severe difficulty breathing, or feel very tired , stop exercising immediately and seek medical attention. 3)  You need to lose weight. Consider a lower calorie diet and regular exercise. Goal is 6 to 10 pounds. 4)  No need for further testing on the kidney cyst,nio management for smll hioatal hernia, continue reflux meds. 5)  BMP prior to visit, ICD-9: 6)  Lipid Panel prior to visit, ICD-9:  fasting August 30 or after 7)  TSH prior to visit, ICD-9: 8)  CBC w/ Diff prior to visit, ICD-9: 9)  PSA prior to visit, ICD-9: 10)  vit d

## 2010-12-02 NOTE — Letter (Signed)
Summary: [facility]  [facility]   Imported By: Lind Guest 09/02/2010 10:43:34  _____________________________________________________________________  External Attachment:    Type:   Image     Comment:   External Document

## 2010-12-02 NOTE — Letter (Signed)
Summary: APPT CONFIRMATION/DR ZIEGLER  APPT CONFIRMATION/DR ZIEGLER   Imported By: Diana Eves 12/10/2009 16:47:22  _____________________________________________________________________  External Attachment:    Type:   Image     Comment:   External Document

## 2010-12-02 NOTE — Progress Notes (Signed)
Summary: left side hurting  Phone Note Call from Patient   Summary of Call: left side been hurting for the last two wks. [phone] pt states doc told him to call back if it keep hurting  Initial call taken by: Rudene Anda,  April 14, 2010 4:42 PM  Follow-up for Phone Call        since pain is on left side and he has a small left kidney cyst, pls refer him to urology, faxthe rept of the abd ct scan, for further eval of left sided pain and kidney cyst Follow-up by: Syliva Overman MD,  April 15, 2010 2:18 PM  Additional Follow-up for Phone Call Additional follow up Details #1::        pt has appt with dr. Frann Rider office on 04/22/2010 2:30. pt notified and aware.  Additional Follow-up by: Rudene Anda,  April 16, 2010 11:06 AM

## 2010-12-02 NOTE — Letter (Signed)
Summary: [facility]/EGD ORDER  [facility]/EGD ORDER   Imported By: Diana Eves 11/25/2009 14:05:42  _____________________________________________________________________  External Attachment:    Type:   Image     Comment:   External Document

## 2010-12-02 NOTE — Letter (Signed)
Summary: *Orthopedic No Show Letter  [facility]  [address] [place] [address]  [place], [place] [address]   Phone: [phone]  Fax: [phone]       05/21/2010   Rupert Stacks [address] [place], [place]  [address]     Dear Mr. Callan,   Our records indicate that you missed your scheduled appointment with Dr. Beaulah Corin on  05/19/2010.  Please contact this office to reschedule your appointment as soon as possible.  It is important that you keep your scheduled appointments with your physician, so we can provide you the best care possible.        Sincerely,    Dr. Terrance Mass, MD [facility] Phone [phone]

## 2010-12-02 NOTE — Progress Notes (Signed)
Summary: REF  Phone Note Call from Patient   Summary of Call: WANTS YOU TO CALL HIM WHEN YOU TO HID REF. HE WENT TO ED LAST NIGHT Initial call taken by: Lind Guest,  August 06, 2010 8:03 AM  Follow-up for Phone Call        pt has a appt with dr. Gabriel Cirri on 08/12/2010 3:00. pt notified  Follow-up by: Rudene Anda,  August 06, 2010 8:57 AM

## 2010-12-02 NOTE — Letter (Signed)
Summary: Patient Notice, Colon Biopsy Results  [facility]  [address]   [place], [place] [address]   Phone: [phone]  Fax: [phone]       November 29, 2009   TREW SUNDE [address] [place], [place]  [address] 1961-05-25    Dear Mr. Prochnow,  I am pleased to inform you that the biopsies taken during your recent colonoscopy did not show any evidence of cancer upon pathologic examination.  Additional information/recommendations:  You should have a repeat colonoscopy examination  in 10 years.  Please call us if you are having persistent problems or have questions about your condition that have not been fully answered at this time.  Sincerely,    R. Roetta Sessions MD  [facility] Ph: [phone]    Fax: [phone]   Appended Document: Patient Notice, Colon Biopsy Results mailed pt letter

## 2010-12-02 NOTE — Letter (Signed)
Summary: DR Darrow Bussing REFERRAL  DR Darrow Bussing REFERRAL   Imported By: Ave Filter 12/03/2009 13:33:51  _____________________________________________________________________  External Attachment:    Type:   Image     Comment:   External Document

## 2010-12-29 ENCOUNTER — Ambulatory Visit: Payer: Self-pay | Admitting: Family Medicine

## 2011-01-14 LAB — CBC
HCT: 42.7 % (ref 39.0–52.0)
MCH: 32.6 pg (ref 26.0–34.0)
MCV: 96.6 fL (ref 78.0–100.0)
Platelets: 229 10*3/uL (ref 150–400)
RBC: 4.42 MIL/uL (ref 4.22–5.81)
WBC: 6.8 10*3/uL (ref 4.0–10.5)

## 2011-01-14 LAB — POCT CARDIAC MARKERS
CKMB, poc: <1 ng/mL — ABNORMAL LOW (ref 1.0–8.0)
Myoglobin, poc: 77.8 ng/mL (ref 12–200)
Troponin i, poc: <0.05 ng/mL (ref 0.00–0.09)

## 2011-01-14 LAB — HEPATIC FUNCTION PANEL
ALT: 25 U/L (ref 0–53)
Indirect Bilirubin: 0.5 mg/dL (ref 0.3–0.9)
Total Protein: 6.8 g/dL (ref 6.0–8.3)

## 2011-01-14 LAB — DIFFERENTIAL
Eosinophils Absolute: 0 10*3/uL (ref 0.0–0.7)
Eosinophils Relative: 0 % (ref 0–5)
Lymphocytes Relative: 49 % — ABNORMAL HIGH (ref 12–46)
Lymphs Abs: 3.3 10*3/uL (ref 0.7–4.0)
Monocytes Absolute: 0.7 10*3/uL (ref 0.1–1.0)
Monocytes Relative: 11 % (ref 3–12)

## 2011-01-14 LAB — BASIC METABOLIC PANEL
CO2: 28 mEq/L (ref 19–32)
Chloride: 103 mEq/L (ref 96–112)
Creatinine, Ser: 1.12 mg/dL (ref 0.4–1.5)
GFR calc Af Amer: >60 mL/min (ref >60)
Potassium: 3.6 mEq/L (ref 3.5–5.1)

## 2011-01-14 LAB — URINALYSIS, ROUTINE W REFLEX MICROSCOPIC
Bilirubin Urine: NEGATIVE
Glucose, UA: NEGATIVE mg/dL
Hgb urine dipstick: NEGATIVE
Ketones, ur: NEGATIVE mg/dL
Specific Gravity, Urine: 1.02 (ref 1.005–1.030)
pH: 6 (ref 5.0–8.0)

## 2011-01-15 LAB — DIFFERENTIAL
Basophils Absolute: 0.1 10*3/uL (ref 0.0–0.1)
Lymphocytes Relative: 62 % — ABNORMAL HIGH (ref 12–46)
Monocytes Absolute: 0.7 10*3/uL (ref 0.1–1.0)
Monocytes Relative: 9 % (ref 3–12)
Neutro Abs: 1.9 10*3/uL (ref 1.7–7.7)
Neutrophils Relative %: 27 % — ABNORMAL LOW (ref 43–77)

## 2011-01-15 LAB — URINALYSIS, ROUTINE W REFLEX MICROSCOPIC
Bilirubin Urine: NEGATIVE
Ketones, ur: NEGATIVE mg/dL
Nitrite: NEGATIVE
Protein, ur: NEGATIVE mg/dL
Specific Gravity, Urine: 1.01 (ref 1.005–1.030)
Urobilinogen, UA: 0.2 mg/dL (ref 0.0–1.0)

## 2011-01-15 LAB — BASIC METABOLIC PANEL
Calcium: 9.1 mg/dL (ref 8.4–10.5)
GFR calc Af Amer: >60 mL/min (ref >60)
GFR calc non Af Amer: >60 mL/min (ref >60)
Potassium: 3.8 mEq/L (ref 3.5–5.1)
Sodium: 141 mEq/L (ref 135–145)

## 2011-01-15 LAB — CBC
HCT: 43.2 % (ref 39.0–52.0)
Hemoglobin: 14.6 g/dL (ref 13.0–17.0)
RBC: 4.47 MIL/uL (ref 4.22–5.81)
WBC: 7 10*3/uL (ref 4.0–10.5)

## 2011-01-15 LAB — URINE CULTURE
Colony Count: NO GROWTH
Culture: NO GROWTH

## 2011-01-21 LAB — BASIC METABOLIC PANEL
BUN: 17 mg/dL (ref 6–23)
Chloride: 105 mEq/L (ref 96–112)
GFR calc non Af Amer: >60 mL/min (ref >60)
Glucose, Bld: 104 mg/dL — ABNORMAL HIGH (ref 70–99)
Potassium: 4.5 mEq/L (ref 3.5–5.1)
Sodium: 137 mEq/L (ref 135–145)

## 2011-01-21 LAB — CBC
HCT: 43.1 % (ref 39.0–52.0)
Hemoglobin: 14.5 g/dL (ref 13.0–17.0)
MCV: 95.8 fL (ref 78.0–100.0)
Platelets: 231 10*3/uL (ref 150–400)
WBC: 5.3 10*3/uL (ref 4.0–10.5)

## 2011-02-02 ENCOUNTER — Encounter: Payer: Self-pay | Admitting: Family Medicine

## 2011-02-03 ENCOUNTER — Encounter: Payer: Self-pay | Admitting: Family Medicine

## 2011-02-04 ENCOUNTER — Encounter: Payer: Self-pay | Admitting: Family Medicine

## 2011-02-05 ENCOUNTER — Ambulatory Visit (INDEPENDENT_AMBULATORY_CARE_PROVIDER_SITE_OTHER): Payer: BC Managed Care – PPO | Admitting: Family Medicine

## 2011-02-05 ENCOUNTER — Encounter: Payer: Self-pay | Admitting: Family Medicine

## 2011-02-05 VITALS — BP 110/70 | HR 86 | Resp 16 | Ht 64.5 in | Wt 228.1 lb

## 2011-02-05 DIAGNOSIS — G4700 Insomnia, unspecified: Secondary | ICD-10-CM

## 2011-02-05 DIAGNOSIS — E669 Obesity, unspecified: Secondary | ICD-10-CM

## 2011-02-05 DIAGNOSIS — Z125 Encounter for screening for malignant neoplasm of prostate: Secondary | ICD-10-CM

## 2011-02-05 DIAGNOSIS — R7301 Impaired fasting glucose: Secondary | ICD-10-CM

## 2011-02-05 DIAGNOSIS — M549 Dorsalgia, unspecified: Secondary | ICD-10-CM

## 2011-02-05 DIAGNOSIS — Z1211 Encounter for screening for malignant neoplasm of colon: Secondary | ICD-10-CM

## 2011-02-05 DIAGNOSIS — E785 Hyperlipidemia, unspecified: Secondary | ICD-10-CM

## 2011-02-05 DIAGNOSIS — Z0000 Encounter for general adult medical examination without abnormal findings: Secondary | ICD-10-CM

## 2011-02-05 DIAGNOSIS — M722 Plantar fascial fibromatosis: Secondary | ICD-10-CM

## 2011-02-05 DIAGNOSIS — E559 Vitamin D deficiency, unspecified: Secondary | ICD-10-CM

## 2011-02-05 DIAGNOSIS — H547 Unspecified visual loss: Secondary | ICD-10-CM

## 2011-02-05 LAB — LIPID PANEL
HDL: 33 mg/dL — ABNORMAL LOW (ref >39)
Total CHOL/HDL Ratio: 4.9 Ratio
Triglycerides: 97 mg/dL (ref <150)

## 2011-02-05 LAB — HEMOCCULT GUIAC POC 1CARD (OFFICE): Fecal Occult Blood, POC: NEGATIVE

## 2011-02-05 LAB — HEMOGLOBIN A1C
Hgb A1c MFr Bld: 5.3 % (ref <5.7)
Mean Plasma Glucose: 105 mg/dL (ref <117)

## 2011-02-05 MED ORDER — MELOXICAM 15 MG PO TABS: 15.0000 mg | ORAL_TABLET | Freq: Every day | ORAL | Status: DC

## 2011-02-05 MED ORDER — TEMAZEPAM 15 MG PO CAPS: 15.0000 mg | ORAL_CAPSULE | Freq: Every evening | ORAL | Status: DC | PRN

## 2011-02-05 NOTE — Patient Instructions (Signed)
F/U end August. A healthy diet is rich in fruit, vegetables and whole grains. Poultry fish, nuts and beans are a healthy choice for protein rather then red meat. A low sodium diet and drinking 64 ounces of water daily is generally recommended. Oils and sweet should be limited. Carbohydrates especially for those who are diabetic or overweight, should be limited to 30-45 gram per meal. It is important to eat on a regular schedule, at least 3 times daily. Snacks should be primarily fruits, vegetables or nuts.   HBA1C and fasting lipid panel today.   Your vit D is low , you need to take Vitamin D OTC 1000IU tablet once daily   pSA and Vit D level end August before next visit pls

## 2011-02-05 NOTE — Progress Notes (Signed)
  Subjective:    Patient ID: Anthony Erickson, male    DOB: May 06, 1961, 50 y.o.   MRN: [id number]  HPI  The PT is here for annual exam  and re-evaluation of chronic medical conditions, medication management and review of recent lab and radiology data.  Preventive health is updated, specifically  Cancer screening, Osteoporosis screening and Immunization.   Questions or concerns regarding consultations or procedures which the PT has had in the interim are  addressed. The PT denies any adverse reactions to current medications since the last visit.   4 month h/o poor sleep, wakes after 4 hours and has difficulty falling asleep again. He does repor stress and anxiety in particular re work, but all aspects of his life, does not feel he needs therapy.  chronic low back pain x 4 months he learned he has arthritis from a scan, reports up to a 10 pain in his low back, primarily when he just wakes up and after sitting for a while.Pain is non radiating , no spasm as bad  As  a 10     Review of Systems Denies recent fever or chills. Denies sinus pressure, nasal congestion, ear pain or sore throat. Denies chest congestion, productive cough or wheezing. Denies chest pains, palpitations, paroxysmal nocturnal dyspnea, orthopnea and leg swelling Denies abdominal pain, nausea, vomiting,diarrhea or constipation.  Denies rectal bleeding or change in bowel movement. Denies dysuria, frequency, hesitancy or incontinence. Denies headaches, seizure, numbness, or tingling. Denies depression,does c/o both anxiety and  insomnia. Denies skin break down or rash.        Objective:   Physical Exam    Patient alert and oriented and in no Cardiopulmonary distress.Pleasant and obese  HEENT: No facial asymmetry, EOMI, no sinus tenderness, TM's clear, Oropharynx pink and moist.  Neck supple no adenopathy.no JVD, no thyromegaly. EOMI, PERTL, fundoscopic exam is benign  Chest: Clear to auscultation bilaterally.no  crackles  Or wheezes. Good air entry throughout.No  Chest wall tenderness.  Breast: no mass, nipple discharge or inversion. No axillary or supraclavicular adenopathy.  CVS: S1, S2 no murmurs, no S3.No thrill  ABD: Soft non tender. Bowel sounds normal.no organomegaly or masses. Genital: no penile lesions or d/c, no testicular mass Rectal: hemorhoids, guaic negative stool.  Ext: No edema  MS: Adequate ROM spine, shoulders, hips and knees.lumbar spine has slight limitation in movement with tenderness and spasm.  Skin: Intact, no ulcerations or rash noted.multiple warts and skin tags  Psych: Good eye contact, normal affect. Memory intact not anxious or depressed appearing.  CNS: CN 2-12 intact, power, tone and sensation normal throughout.     Assessment & Plan:  Obesity: deteriorated: lifestyle change discussed and encouraged to facilitate weight loss.hBA1c to be checked, there is a strong f/h of diabetes. Gerd: no change in management. Back pain; deteriorated, pt to start anti-inflammatory as needed, weight loss and back strengthening exercises also encouraged. Insomnia: mainly due to anxiety, alternative response to anxiety encouraged, sleep hygiene discussed, and med to be started also

## 2011-02-06 ENCOUNTER — Encounter: Payer: Self-pay | Admitting: Family Medicine

## 2011-02-08 ENCOUNTER — Encounter: Payer: Self-pay | Admitting: Family Medicine

## 2011-02-08 DIAGNOSIS — G4700 Insomnia, unspecified: Secondary | ICD-10-CM | POA: Insufficient documentation

## 2011-02-12 LAB — URINALYSIS, ROUTINE W REFLEX MICROSCOPIC
Nitrite: NEGATIVE
Protein, ur: NEGATIVE mg/dL
Specific Gravity, Urine: 1.015 (ref 1.005–1.030)
Urobilinogen, UA: 1 mg/dL (ref 0.0–1.0)

## 2011-02-12 LAB — CBC
MCV: 95.1 fL (ref 78.0–100.0)
Platelets: 187 10*3/uL (ref 150–400)
RBC: 4.51 MIL/uL (ref 4.22–5.81)
WBC: 5.4 10*3/uL (ref 4.0–10.5)

## 2011-02-12 LAB — COMPREHENSIVE METABOLIC PANEL
ALT: 81 U/L — ABNORMAL HIGH (ref 0–53)
AST: 51 U/L — ABNORMAL HIGH (ref 0–37)
Albumin: 3.9 g/dL (ref 3.5–5.2)
CO2: 29 mEq/L (ref 19–32)
Chloride: 105 mEq/L (ref 96–112)
Creatinine, Ser: 1.08 mg/dL (ref 0.4–1.5)
GFR calc Af Amer: >60 mL/min (ref >60)
Potassium: 3.8 mEq/L (ref 3.5–5.1)
Sodium: 138 mEq/L (ref 135–145)
Total Bilirubin: 0.9 mg/dL (ref 0.3–1.2)

## 2011-02-12 LAB — DIFFERENTIAL
Basophils Absolute: 0 10*3/uL (ref 0.0–0.1)
Eosinophils Absolute: 0 10*3/uL (ref 0.0–0.7)
Eosinophils Relative: 0 % (ref 0–5)
Lymphocytes Relative: 48 % — ABNORMAL HIGH (ref 12–46)
Monocytes Absolute: 0.6 10*3/uL (ref 0.1–1.0)

## 2011-02-13 MED ORDER — VITAMIN D (ERGOCALCIFEROL) 1.25 MG (50000 UNIT) PO CAPS: 50000.0000 [IU] | ORAL_CAPSULE | Freq: Once | ORAL | Status: DC

## 2011-02-13 NOTE — Progress Notes (Signed)
Called patient left message

## 2011-02-16 ENCOUNTER — Other Ambulatory Visit: Payer: Self-pay

## 2011-02-16 MED ORDER — VITAMIN D (ERGOCALCIFEROL) 1.25 MG (50000 UNIT) PO CAPS: 50000.0000 [IU] | ORAL_CAPSULE | ORAL | Status: DC

## 2011-03-20 NOTE — Op Note (Signed)
NAME:  Anthony Erickson, Anthony Erickson              ACCOUNT NO.:  [id number]   MEDICAL RECORD NO.:  [id number]          PATIENT TYPE:  AMB   LOCATION:  DAY                           FACILITY:  [facility]   PHYSICIAN:  Jerolyn Shin C. Katrinka Blazing, M.D.   DATE OF BIRTH:  01/17/1961   DATE OF PROCEDURE:  01/13/2005  DATE OF DISCHARGE:                                 OPERATIVE REPORT   PREOPERATIVE DIAGNOSIS:  Ganglion cyst, right wrist.   POSTOPERATIVE DIAGNOSIS:  Ganglion cyst, right wrist.   PROCEDURE:  Excision of ganglion cyst,  right wrist.   SURGEON:  Elpidio Anis, M.D.   DESCRIPTION:  The patient was taken to the OR suite. Bier block was induced.  Her right arm and hand were prepped and draped in a sterile field.  A  transverse incision was made over the large cystic mass at the wrist along  the radial aspect.  A small vein riding over the cyst in the subcutaneous  tissue was retracted laterally.  The cyst wall was exposed.  The cyst was  gently mobilized until dissection was continued down to the base of the  cyst.  The cyst appeared to arise from the tendon sheath of the second  finger.  The cyst was gently dissected off the tendon sheath, taking care not to  injure the tendon.  There was a small cystic lesion that was more medial  coming off the same tendon sheath.  It was grasped and pulled into the  operative field.  The top one-half portion of this was excised so that the  surrounding neural structures would not be injured.  There was no bleeding.  The patient tolerated the procedure well.  Subcutaneous tissue was closed  with 3-0 Vicryl.  Skin was closed with subcuticular 4-0 Dexon.  OpSite dressing was placed.  A wrist splint was placed.  The patient was  transferred to a bed and taken to the postanesthetic care unit for  monitoring.      LCS/MEDQ  D:  01/13/2005  T:  01/13/2005  Job:  [id number]

## 2011-03-20 NOTE — H&P (Signed)
NAME:  Anthony Erickson, Anthony Erickson              ACCOUNT NO.:  [id number]   MEDICAL RECORD NO.:  [id number]          PATIENT TYPE:  AMB   LOCATION:  DAY                           FACILITY:  [facility]   PHYSICIAN:  Jerolyn Shin C. Katrinka Blazing, M.D.   DATE OF BIRTH:  [birth year]   DATE OF ADMISSION:  DATE OF DISCHARGE:  LH                                HISTORY & PHYSICAL   HISTORY OF PRESENT ILLNESS:  A 50 year old male with a history of a cystic  mass at the dorsal aspect of his right hand for about a year.  It has been  aspirated once, and it resolved spontaneously once, but it has recurred each  time at the same place.  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  He has had episodic pain.  He is scheduled to have the  mass removed under anesthesia.   PAST HISTORY:  1.  Diabetes mellitus.  2.  Hyperlipidemia.   MEDICATIONS:  Not known.  The patient did not bring his medications to the  office, and he did not call back with his medication list as he promised.   ALLERGIES:  None.   SOCIAL HISTORY:  He is single, and does not drink, smoke, or use drugs.  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   PHYSICAL EXAMINATION:  VITAL SIGNS:  Blood pressure 120/60, pulse 80,  respirations 18.  HEENT:  Unremarkable.  NECK:  Supple.  No JVD, bruit, adenopathy, or thyromegaly.  CHEST:  Clear to auscultation.  HEART:  Regular rate and rhythm without murmur, gallop, or rub.  ABDOMEN:  Soft, nontender.  No masses.  EXTREMITIES:  A 2 x 2 cm mass in the dorsoradial aspect of the right wrist,  partially mobile.  NEUROLOGIC:  No focal motor, sensory, or cerebellar deficit.   IMPRESSION:  1.  Ganglion cyst, right wrist.  2.  Diabetes mellitus.  3.  Hyperlipidemia.   PLAN:  Excision of mass, right wrist, under anesthesia.      LCS/MEDQ  D:  01/13/2005  T:  01/13/2005  Job:  [id number]   cc:   Short Stay, [facility]

## 2011-05-03 DEATH — deceased

## 2011-07-03 ENCOUNTER — Other Ambulatory Visit: Payer: Self-pay | Admitting: Family Medicine

## 2011-07-13 ENCOUNTER — Other Ambulatory Visit: Payer: Self-pay | Admitting: Family Medicine

## 2011-07-13 DIAGNOSIS — R972 Elevated prostate specific antigen [PSA]: Secondary | ICD-10-CM

## 2011-07-15 ENCOUNTER — Encounter: Payer: Self-pay | Admitting: Family Medicine

## 2011-07-17 ENCOUNTER — Ambulatory Visit: Payer: BC Managed Care – PPO | Admitting: Family Medicine

## 2011-07-23 ENCOUNTER — Encounter: Payer: Self-pay | Admitting: Family Medicine

## 2011-07-24 ENCOUNTER — Encounter: Payer: Self-pay | Admitting: Family Medicine

## 2011-07-24 ENCOUNTER — Ambulatory Visit: Payer: BC Managed Care – PPO | Admitting: Family Medicine

## 2011-08-18 LAB — CBC
HCT: 42.4
Hemoglobin: 14.4
MCHC: 33.9
MCV: 94.4
RBC: 4.49

## 2011-08-18 LAB — POCT CARDIAC MARKERS
Myoglobin, poc: 70.5
Troponin i, poc: <0.05

## 2011-08-18 LAB — URINALYSIS, ROUTINE W REFLEX MICROSCOPIC
Bilirubin Urine: NEGATIVE
Glucose, UA: NEGATIVE
Hgb urine dipstick: NEGATIVE
Specific Gravity, Urine: >1.03 — ABNORMAL HIGH

## 2011-08-18 LAB — BASIC METABOLIC PANEL
CO2: 29
Chloride: 108
GFR calc Af Amer: >60
Sodium: 141

## 2011-08-18 LAB — DIFFERENTIAL
Basophils Relative: 1
Eosinophils Absolute: 0
Eosinophils Relative: 1
Monocytes Absolute: 0.5
Monocytes Relative: 8

## 2011-08-27 ENCOUNTER — Encounter: Payer: Self-pay | Admitting: Family Medicine

## 2011-08-27 ENCOUNTER — Ambulatory Visit (INDEPENDENT_AMBULATORY_CARE_PROVIDER_SITE_OTHER): Payer: BC Managed Care – PPO | Admitting: Family Medicine

## 2011-08-27 DIAGNOSIS — J40 Bronchitis, not specified as acute or chronic: Secondary | ICD-10-CM | POA: Insufficient documentation

## 2011-08-27 DIAGNOSIS — E669 Obesity, unspecified: Secondary | ICD-10-CM

## 2011-08-27 DIAGNOSIS — C61 Malignant neoplasm of prostate: Secondary | ICD-10-CM | POA: Insufficient documentation

## 2011-08-27 DIAGNOSIS — R972 Elevated prostate specific antigen [PSA]: Secondary | ICD-10-CM

## 2011-08-27 DIAGNOSIS — J329 Chronic sinusitis, unspecified: Secondary | ICD-10-CM | POA: Insufficient documentation

## 2011-08-27 HISTORY — DX: Malignant neoplasm of prostate: C61

## 2011-08-27 MED ORDER — PREDNISONE (PAK) 5 MG PO TABS: 5.0000 mg | ORAL_TABLET | ORAL | Status: AC

## 2011-08-27 MED ORDER — BENZONATATE 100 MG PO CAPS: 100.0000 mg | ORAL_CAPSULE | Freq: Three times a day (TID) | ORAL | Status: DC | PRN

## 2011-08-27 MED ORDER — FLUTICASONE PROPIONATE 50 MCG/ACT NA SUSP: 1.0000 | Freq: Every day | NASAL | Status: DC

## 2011-08-27 MED ORDER — SULFAMETHOXAZOLE-TRIMETHOPRIM 800-160 MG PO TABS: 1.0000 | ORAL_TABLET | Freq: Two times a day (BID) | ORAL | Status: AC

## 2011-08-27 NOTE — Patient Instructions (Signed)
F/U in 3.5 months   You absolutely need to see urology about abnormal  prostate test. Please sort this out at check out before you leave.  Medication is sent in for sinusitis and bronchitis  Continue regular exercise

## 2011-08-27 NOTE — Progress Notes (Signed)
Pt has appt with dr. Annabell Howells for 08/28/2011 8:45. Pt is aware

## 2011-08-28 ENCOUNTER — Ambulatory Visit (INDEPENDENT_AMBULATORY_CARE_PROVIDER_SITE_OTHER): Payer: BC Managed Care – PPO | Admitting: Urology

## 2011-08-28 DIAGNOSIS — R972 Elevated prostate specific antigen [PSA]: Secondary | ICD-10-CM

## 2011-08-30 NOTE — Progress Notes (Signed)
  Subjective:    Patient ID: Anthony Erickson, male    DOB: 1961/04/20, 50 y.o.   MRN: [id number]  HPI 1 week h/o increased head and chest congestion with yellow green nasal drainage and cough productive of yellow sputum. He has had no fever , but has had chills. Missed previous appt for lab review, unfortunately , since that time he had an abnormal PSA which required urological eval, still has not happened, pt made very aware of this today. Denies urinary symptoms and there is no family h/o prostate cancer. Pt reports he has recently started exercise, unfortunately he has gained a significant amt of weight since his last visit   Review of Systems See HPI  Denies chest pains, palpitations and leg swelling Denies abdominal pain, nausea, vomiting,diarrhea or constipation.   Denies dysuria, frequency, hesitancy or incontinence. Denies joint pain, swelling and limitation in mobility. Denies headaches, seizures, numbness, or tingling. Denies depression, anxiety or insomnia. Denies skin break down or rash.        Objective:   Physical Exam Patient alert and oriented and in no cardiopulmonary distress.  HEENT: No facial asymmetry, EOMI, frontal and maxillary sinus tenderness,  oropharynx pink and moist.  Neck supple no adenopathy.  Chest: decreased air entry bilaterally, scattered crackles and wheezes  CVS: S1, S2 no murmurs, no S3.  ABD: Soft non tender. Bowel sounds normal.  Ext: No edema  MS: Adequate ROM spine, shoulders, hips and knees.  Skin: Intact, no ulcerations or rash noted.  Psych: Good eye contact, normal affect. Memory intact not anxious or depressed appearing.  CNS: CN 2-12 intact, power, tone and sensation normal throughout.        Assessment & Plan:

## 2011-08-30 NOTE — Assessment & Plan Note (Signed)
appt with urology needed, explained the significance of th abn result with the pt

## 2011-08-30 NOTE — Assessment & Plan Note (Signed)
Antibiotic prescribed 

## 2011-08-30 NOTE — Assessment & Plan Note (Signed)
Deteriorated. Patient re-educated about  the importance of commitment to a  minimum of 150 minutes of exercise per week. The importance of healthy food choices with portion control discussed. Encouraged to start a food diary, count calories and to consider  joining a support group. Sample diet sheets offered. Goals set by the patient for the next several months.    

## 2011-12-11 ENCOUNTER — Ambulatory Visit (INDEPENDENT_AMBULATORY_CARE_PROVIDER_SITE_OTHER): Payer: BC Managed Care – PPO | Admitting: Family Medicine

## 2011-12-11 ENCOUNTER — Encounter: Payer: Self-pay | Admitting: Family Medicine

## 2011-12-11 VITALS — BP 112/82 | HR 83 | Resp 16 | Ht 64.5 in | Wt 225.0 lb

## 2011-12-11 DIAGNOSIS — R5381 Other malaise: Secondary | ICD-10-CM

## 2011-12-11 DIAGNOSIS — Z139 Encounter for screening, unspecified: Secondary | ICD-10-CM

## 2011-12-11 DIAGNOSIS — E785 Hyperlipidemia, unspecified: Secondary | ICD-10-CM

## 2011-12-11 DIAGNOSIS — R972 Elevated prostate specific antigen [PSA]: Secondary | ICD-10-CM

## 2011-12-11 DIAGNOSIS — R5383 Other fatigue: Secondary | ICD-10-CM

## 2011-12-11 DIAGNOSIS — G4700 Insomnia, unspecified: Secondary | ICD-10-CM

## 2011-12-11 DIAGNOSIS — E669 Obesity, unspecified: Secondary | ICD-10-CM

## 2011-12-11 DIAGNOSIS — K219 Gastro-esophageal reflux disease without esophagitis: Secondary | ICD-10-CM

## 2011-12-11 MED ORDER — TEMAZEPAM 15 MG PO CAPS: 15.0000 mg | ORAL_CAPSULE | Freq: Every evening | ORAL | Status: DC | PRN

## 2011-12-11 NOTE — Progress Notes (Signed)
  Subjective:    Patient ID: Anthony Erickson, male    DOB: 1961-10-28, 51 y.o.   MRN: [id number]  HPI The PT is here for follow up and re-evaluation of chronic medical conditions, medication management and review of any available recent lab and radiology data.  Preventive health is updated, specifically  Cancer screening and Immunization.   Questions or concerns regarding consultations or procedures which the PT has had in the interim are  addressed. The PT denies any adverse reactions to current medications since the last visit.  C/o poor sleep with early morning awakenings, requests help. On review , his sleep hygiene is good. He intends to increase his exercise and lower his caloric intake to facilitate weight loss    Review of Systems See HPI Denies recent fever or chills. Denies sinus pressure, nasal congestion, ear pain or sore throat. Denies chest congestion, productive cough or wheezing. Denies chest pains, palpitations and leg swelling Denies abdominal pain, nausea, vomiting,diarrhea or constipation.   Denies dysuria, frequency, hesitancy or incontinence. Denies joint pain, swelling and limitation in mobility. Denies headaches, seizures, numbness, or tingling. Denies depression or  anxiety  Denies skin break down or rash.        Objective:   Physical Exam  Patient alert and oriented and in no cardiopulmonary distress.  HEENT: No facial asymmetry, EOMI, no sinus tenderness,  oropharynx pink and moist.  Neck supple no adenopathy.  Chest: Clear to auscultation bilaterally.  CVS: S1, S2 no murmurs, no S3.  ABD: Soft non tender. Bowel sounds normal.  Ext: No edema  MS: Adequate ROM spine, shoulders, hips and knees.  Skin: Intact, no ulcerations or rash noted.  Psych: Good eye contact, normal affect. Memory intact not anxious or depressed appearing.  CNS: CN 2-12 intact, power, tone and sensation normal throughout.       Assessment & Plan:

## 2011-12-11 NOTE — Assessment & Plan Note (Signed)
Low fat diet discussed and encouraged, labs in August

## 2011-12-11 NOTE — Assessment & Plan Note (Signed)
Has had negative prostate biopsy in December, and will be followed by urology

## 2011-12-11 NOTE — Assessment & Plan Note (Signed)
Controlled, no change in medication  

## 2011-12-11 NOTE — Assessment & Plan Note (Signed)
Sleep hygiene education done, pt to start restoril for assistance also

## 2011-12-11 NOTE — Assessment & Plan Note (Signed)
Deteriorated. Patient re-educated about  the importance of commitment to a  minimum of 150 minutes of exercise per week. The importance of healthy food choices with portion control discussed. Encouraged to start a food diary, count calories and to consider  joining a support group. Sample diet sheets offered. Goals set by the patient for the next several months.    

## 2011-12-11 NOTE — Patient Instructions (Signed)
cPE in August.  Pls practice good sleep hygiene.  Ypu will start med to help with sleep, if this does not work well, pls call in 2 to 2 weeks so the dose can be adjusted.   It is important that you exercise regularly at least 30 minutes 5 times a week. If you develop chest pain, have severe difficulty breathing, or feel very tired, stop exercising immediately and seek medical attention   A healthy diet is rich in fruit, vegetables and whole grains. Poultry fish, nuts and beans are a healthy choice for protein rather then red meat. A low sodium diet and drinking 64 ounces of water daily is generally recommended. Oils and sweet should be limited. Carbohydrates especially for those who are diabetic or overweight, should be limited to 34-45 gram per meal. It is important to eat on a regular schedule, at least 3 times daily. Snacks should be primarily fruits, vegetables or nuts.  Weight loss goal of 5 to 10 pounds  Fasting CBC, chem 7, lipid, Vit D and tSH in 5 month  And 3 weeks pls

## 2012-04-08 ENCOUNTER — Ambulatory Visit (INDEPENDENT_AMBULATORY_CARE_PROVIDER_SITE_OTHER): Payer: BC Managed Care – PPO | Admitting: Urology

## 2012-04-08 DIAGNOSIS — R972 Elevated prostate specific antigen [PSA]: Secondary | ICD-10-CM

## 2012-05-02 ENCOUNTER — Ambulatory Visit (INDEPENDENT_AMBULATORY_CARE_PROVIDER_SITE_OTHER): Payer: BC Managed Care – PPO | Admitting: Family Medicine

## 2012-05-02 ENCOUNTER — Encounter: Payer: Self-pay | Admitting: Family Medicine

## 2012-05-02 VITALS — BP 120/84 | HR 102 | Resp 16 | Ht 65.0 in | Wt 229.1 lb

## 2012-05-02 DIAGNOSIS — F32A Depression, unspecified: Secondary | ICD-10-CM | POA: Insufficient documentation

## 2012-05-02 DIAGNOSIS — R52 Pain, unspecified: Secondary | ICD-10-CM

## 2012-05-02 DIAGNOSIS — E785 Hyperlipidemia, unspecified: Secondary | ICD-10-CM

## 2012-05-02 DIAGNOSIS — R1032 Left lower quadrant pain: Secondary | ICD-10-CM

## 2012-05-02 DIAGNOSIS — K219 Gastro-esophageal reflux disease without esophagitis: Secondary | ICD-10-CM

## 2012-05-02 DIAGNOSIS — G4700 Insomnia, unspecified: Secondary | ICD-10-CM

## 2012-05-02 DIAGNOSIS — M549 Dorsalgia, unspecified: Secondary | ICD-10-CM

## 2012-05-02 DIAGNOSIS — F329 Major depressive disorder, single episode, unspecified: Secondary | ICD-10-CM

## 2012-05-02 DIAGNOSIS — R5381 Other malaise: Secondary | ICD-10-CM

## 2012-05-02 DIAGNOSIS — Z139 Encounter for screening, unspecified: Secondary | ICD-10-CM

## 2012-05-02 DIAGNOSIS — B079 Viral wart, unspecified: Secondary | ICD-10-CM | POA: Insufficient documentation

## 2012-05-02 DIAGNOSIS — E669 Obesity, unspecified: Secondary | ICD-10-CM

## 2012-05-02 DIAGNOSIS — R972 Elevated prostate specific antigen [PSA]: Secondary | ICD-10-CM

## 2012-05-02 DIAGNOSIS — E559 Vitamin D deficiency, unspecified: Secondary | ICD-10-CM

## 2012-05-02 DIAGNOSIS — F3289 Other specified depressive episodes: Secondary | ICD-10-CM

## 2012-05-02 LAB — CBC WITH DIFFERENTIAL/PLATELET
Basophils Absolute: 0 10*3/uL (ref 0.0–0.1)
Eosinophils Relative: 1 % (ref 0–5)
Lymphocytes Relative: 51 % — ABNORMAL HIGH (ref 12–46)
Lymphs Abs: 3.2 10*3/uL (ref 0.7–4.0)
MCV: 91.3 fL (ref 78.0–100.0)
Neutro Abs: 2.3 10*3/uL (ref 1.7–7.7)
Neutrophils Relative %: 37 % — ABNORMAL LOW (ref 43–77)
Platelets: 251 10*3/uL (ref 150–400)
RBC: 4.82 MIL/uL (ref 4.22–5.81)
RDW: 13.7 % (ref 11.5–15.5)
WBC: 6.3 10*3/uL (ref 4.0–10.5)

## 2012-05-02 MED ORDER — CIPROFLOXACIN HCL 500 MG PO TABS: 500.0000 mg | ORAL_TABLET | Freq: Two times a day (BID) | ORAL | Status: AC

## 2012-05-02 MED ORDER — PREDNISONE (PAK) 5 MG PO TABS: 5.0000 mg | ORAL_TABLET | ORAL | Status: DC

## 2012-05-02 MED ORDER — FLUOXETINE HCL 10 MG PO CAPS: 10.0000 mg | ORAL_CAPSULE | Freq: Every day | ORAL | Status: DC

## 2012-05-02 MED ORDER — IBUPROFEN 800 MG PO TABS: 800.0000 mg | ORAL_TABLET | Freq: Three times a day (TID) | ORAL | Status: AC | PRN

## 2012-05-02 MED ORDER — TEMAZEPAM 15 MG PO CAPS: 15.0000 mg | ORAL_CAPSULE | Freq: Every evening | ORAL | Status: DC | PRN

## 2012-05-02 NOTE — Patient Instructions (Addendum)
F/u as before in August.  You will start medication for depression and insomnia.  You are prescribed medication for back pain, and also for abdominal pain  Work excuse today to return on 05/04/2012   Labs today to help to determine problems you are having, you will be called if abnormal  You are referred to dr [facility] for wart removal

## 2012-05-02 NOTE — Progress Notes (Signed)
  Subjective:    Patient ID: Anthony Erickson, male    DOB: December 29, 1960, 51 y.o.   MRN: [id number]  HPI 1 week h/o increased low back pain, excessive fatigue, lower abdominal and left lower quadrant pain 2 day h/o frequent watery loose stools, no blood, fever or chills C/o warts enlarging wants them removed C/o depression, not suicidal or homicidal, feels stressed and overwhelmed   Review of Systems See HPI Denies recent fever or chills. Denies sinus pressure, nasal congestion, ear pain or sore throat. Denies chest congestion, productive cough or wheezing. Denies chest pains, palpitations and leg swelling  Denies dysuria, frequency, hesitancy or incontinence. Denies joint pain, swelling and limitation in mobility. Denies headaches, seizures, numbness, or tingling.       Objective:   Physical Exam Patient alert and oriented and in no cardiopulmonary distress.  HEENT: No facial asymmetry, EOMI, no sinus tenderness,  oropharynx pink and moist.  Neck supple no adenopathy.  Chest: Clear to auscultation bilaterally.  CVS: S1, S2 no murmurs, no S3.  ABD: Soft left lower quadrant tenderness, no guarding or rebound, no palpable organomegaly or massesl.  Ext: No edema  MS: decreased ROM spine,adequate in  shoulders, hips and knees.  Skin: Intact, no ulcerations or rash noted.  Psych: Good eye contact, normal affect. Memory intact mildly  anxious and  depressed appearing.  CNS: CN 2-12 intact, power, tone and sensation normal throughout.        Assessment & Plan:

## 2012-05-03 LAB — COMPREHENSIVE METABOLIC PANEL
AST: 19 U/L (ref 0–37)
Albumin: 4.7 g/dL (ref 3.5–5.2)
Alkaline Phosphatase: 63 U/L (ref 39–117)
Potassium: 4.7 mEq/L (ref 3.5–5.3)
Sodium: 142 mEq/L (ref 135–145)
Total Bilirubin: 0.7 mg/dL (ref 0.3–1.2)
Total Protein: 7.3 g/dL (ref 6.0–8.3)

## 2012-05-03 LAB — HIV ANTIBODY (ROUTINE TESTING W REFLEX): HIV: NONREACTIVE

## 2012-05-03 LAB — HEMOGLOBIN A1C
Hgb A1c MFr Bld: 5.5 % (ref <5.7)
Mean Plasma Glucose: 111 mg/dL (ref <117)

## 2012-05-03 NOTE — Assessment & Plan Note (Signed)
Presumed flare of colitis, cipro prescribed for 3 days

## 2012-05-03 NOTE — Assessment & Plan Note (Signed)
Uncontrolled back pain, steroid dose pack and anti inflammatory

## 2012-05-03 NOTE — Assessment & Plan Note (Signed)
Sleep hygiene discussed, pt to take restoril

## 2012-05-03 NOTE — Assessment & Plan Note (Signed)
Uncontrolled, over 6 month of symptoms, not suicidal or homicidal. Personal stress, not financial

## 2012-05-03 NOTE — Assessment & Plan Note (Signed)
Followed by urology.   

## 2012-05-03 NOTE — Assessment & Plan Note (Signed)
Deterioration in symptom, basic lab data to be checked

## 2012-05-03 NOTE — Assessment & Plan Note (Signed)
Warts on both thighs, referred to gen surg for removal

## 2012-05-03 NOTE — Assessment & Plan Note (Signed)
Controlled, no change in medication  

## 2012-05-03 NOTE — Assessment & Plan Note (Signed)
Hyperlipidemia:Low fat diet discussed and encouraged.   

## 2012-06-10 ENCOUNTER — Encounter: Payer: BC Managed Care – PPO | Admitting: Family Medicine

## 2012-06-27 ENCOUNTER — Other Ambulatory Visit: Payer: Self-pay | Admitting: Family Medicine

## 2012-07-14 ENCOUNTER — Encounter: Payer: BC Managed Care – PPO | Admitting: Family Medicine

## 2012-10-07 ENCOUNTER — Ambulatory Visit: Payer: BC Managed Care – PPO | Admitting: Urology

## 2012-11-07 LAB — CBC WITH DIFFERENTIAL/PLATELET
Basophils Relative: 1 % (ref 0–1)
Eosinophils Absolute: 0 10*3/uL (ref 0.0–0.7)
HCT: 44.4 % (ref 39.0–52.0)
Hemoglobin: 15.3 g/dL (ref 13.0–17.0)
Lymphs Abs: 4.3 10*3/uL — ABNORMAL HIGH (ref 0.7–4.0)
MCH: 31.7 pg (ref 26.0–34.0)
MCHC: 34.5 g/dL (ref 30.0–36.0)
Monocytes Absolute: 0.8 10*3/uL (ref 0.1–1.0)
Monocytes Relative: 11 % (ref 3–12)
Neutro Abs: 1.9 10*3/uL (ref 1.7–7.7)
Neutrophils Relative %: 25 % — ABNORMAL LOW (ref 43–77)
RBC: 4.83 MIL/uL (ref 4.22–5.81)

## 2012-11-07 LAB — BASIC METABOLIC PANEL
BUN: 15 mg/dL (ref 6–23)
CO2: 27 mEq/L (ref 19–32)
Glucose, Bld: 110 mg/dL — ABNORMAL HIGH (ref 70–99)
Potassium: 4.3 mEq/L (ref 3.5–5.3)
Sodium: 144 mEq/L (ref 135–145)

## 2012-11-07 LAB — TSH: TSH: 1.229 u[IU]/mL (ref 0.350–4.500)

## 2012-11-07 LAB — LIPID PANEL
Cholesterol: 177 mg/dL (ref 0–200)
Total CHOL/HDL Ratio: 4.9 Ratio
VLDL: 23 mg/dL (ref 0–40)

## 2012-11-07 NOTE — Addendum Note (Signed)
Addended by: Abner Greenspan on: 11/07/2012 09:05 AM   Modules accepted: Orders

## 2012-11-08 LAB — VITAMIN D 25 HYDROXY (VIT D DEFICIENCY, FRACTURES): Vit D, 25-Hydroxy: 27 ng/mL — ABNORMAL LOW (ref 30–89)

## 2012-11-14 ENCOUNTER — Telehealth: Payer: Self-pay | Admitting: Family Medicine

## 2012-11-14 ENCOUNTER — Encounter: Payer: BC Managed Care – PPO | Admitting: Family Medicine

## 2012-11-14 NOTE — Telephone Encounter (Signed)
Spoke with Coady. States the entire family is at peace with his mother's passing if  And when sghe does duriong this hospitalization. She has had several arrests and is on life support currently .Doctors who are caring for her have been compassionate and have painted a poor prognosis for recovery, with which I concur and told Mr Greek this

## 2012-11-18 ENCOUNTER — Ambulatory Visit: Payer: BC Managed Care – PPO | Admitting: Urology

## 2012-11-19 ENCOUNTER — Other Ambulatory Visit: Payer: Self-pay | Admitting: Family Medicine

## 2012-11-25 ENCOUNTER — Ambulatory Visit (INDEPENDENT_AMBULATORY_CARE_PROVIDER_SITE_OTHER): Payer: BC Managed Care – PPO | Admitting: Urology

## 2012-11-25 DIAGNOSIS — R972 Elevated prostate specific antigen [PSA]: Secondary | ICD-10-CM

## 2012-11-25 DIAGNOSIS — D400 Neoplasm of uncertain behavior of prostate: Secondary | ICD-10-CM

## 2012-12-09 ENCOUNTER — Encounter: Payer: BC Managed Care – PPO | Admitting: Family Medicine

## 2012-12-19 ENCOUNTER — Encounter: Payer: BC Managed Care – PPO | Admitting: Family Medicine

## 2013-01-02 ENCOUNTER — Encounter: Payer: BC Managed Care – PPO | Admitting: Family Medicine

## 2013-02-23 ENCOUNTER — Other Ambulatory Visit: Payer: Self-pay | Admitting: Family Medicine

## 2013-02-28 ENCOUNTER — Encounter: Payer: Self-pay | Admitting: Family Medicine

## 2013-02-28 ENCOUNTER — Ambulatory Visit (INDEPENDENT_AMBULATORY_CARE_PROVIDER_SITE_OTHER): Payer: BC Managed Care – PPO | Admitting: Family Medicine

## 2013-02-28 VITALS — BP 110/80 | HR 80 | Resp 18 | Wt 231.0 lb

## 2013-02-28 DIAGNOSIS — Z1211 Encounter for screening for malignant neoplasm of colon: Secondary | ICD-10-CM

## 2013-02-28 DIAGNOSIS — Z0000 Encounter for general adult medical examination without abnormal findings: Secondary | ICD-10-CM

## 2013-02-28 DIAGNOSIS — R7309 Other abnormal glucose: Secondary | ICD-10-CM

## 2013-02-28 DIAGNOSIS — E785 Hyperlipidemia, unspecified: Secondary | ICD-10-CM

## 2013-02-28 DIAGNOSIS — R7302 Impaired glucose tolerance (oral): Secondary | ICD-10-CM

## 2013-02-28 NOTE — Progress Notes (Signed)
  Subjective:    Patient ID: Anthony Erickson, male    DOB: 01/19/1961, 52 y.o.   MRN: [id number]  HPI Pt i for annual exam. Lost his mother earlier this year but states he is handling this well. Concerned about weight gain despite regular exercise and intends to reduce sweets and carbs to facilitate weight loss and improved health   Review of Systems See HPI Denies recent fever or chills. Denies sinus pressure, nasal congestion, ear pain or sore throat. Denies chest congestion, productive cough or wheezing. Denies chest pains, palpitations and leg swelling Denies abdominal pain, nausea, vomiting,diarrhea or constipation.   Denies dysuria, frequency, hesitancy or incontinence. Denies joint pain, swelling and limitation in mobility. Denies headaches, seizures, numbness, or tingling. Denies depression, anxiety or insomnia. Denies skin break down or rash.        Objective:   Physical Exam  Pleasant well nourished male, alert and oriented x 3, in no cardio-pulmonary distress. Afebrile. HEENT No facial trauma or asymetry. Sinuses non tender. EOMI, PERTL, fundoscopic exam is negative for hemorhages or exudates. External ears normal, tympanic membranes clear. Oropharynx moist, no exudate,fairly  good dentition. Neck: supple, no adenopathy,JVD or thyromegaly.No bruits.  Chest: Clear to ascultation bilaterally.No crackles or wheezes. Non tender to palpation  Breast: No asymetry,no masses. No nipple discharge or inversion. No axillary or supraclavicular adenopathy  Cardiovascular system; Heart sounds normal,  S1 and  S2 ,no S3.  No murmur, or thrill. Apical beat not displaced Peripheral pulses normal.  Abdomen: Soft, non tender, no organomegaly or masses. No bruits. Bowel sounds normal. No guarding, tenderness or rebound.  Rectal:  No mass. guaiac negative stool. Prostate smooth and firm    Musculoskeletal exam: Full ROM of spine, hips , shoulders and knees. No  deformity ,swelling or crepitus noted. No muscle wasting or atrophy.   Neurologic: Cranial nerves 2 to 12 intact. Power, tone ,sensation and reflexes normal throughout. No disturbance in gait. No tremor.  Skin: Intact, no ulceration, erythema , scaling or rash noted. Pigmentation normal throughout  Psych; Normal mood and affect. Judgement and concentration normal         Assessment & Plan:

## 2013-02-28 NOTE — Patient Instructions (Addendum)
F/u in 5 month, please call if you need me before  HhBA1C today  You need to work on weight loss, aim for 10 to 12 pounds.  Please cut back on fried and fatty foods and red meat, bad cholesterol slightly high  Please set a goal of walking 3 miles every day to improve health  Rectal today  Fasting lipid in 5 month

## 2013-03-01 LAB — HEMOGLOBIN A1C: Hgb A1c MFr Bld: 5.1 % (ref <5.7)

## 2013-03-05 DIAGNOSIS — Z0000 Encounter for general adult medical examination without abnormal findings: Secondary | ICD-10-CM | POA: Insufficient documentation

## 2013-03-05 NOTE — Assessment & Plan Note (Signed)
General medical examination completed as documented Apart from obesity  With weight gain , there are no new or outstanding concerns. He is encouraged to be diligent in addressing this to improve his health Urology follows him for elevated PSA. Prostate exam is normal at this visit

## 2013-04-08 ENCOUNTER — Emergency Department (HOSPITAL_COMMUNITY)
Admission: EM | Admit: 2013-04-08 | Discharge: 2013-04-09 | Disposition: A | Discharge status: Home / Self Care | Payer: BC Managed Care – PPO | Attending: Emergency Medicine | Admitting: Emergency Medicine

## 2013-04-08 ENCOUNTER — Emergency Department (HOSPITAL_COMMUNITY): Payer: BC Managed Care – PPO

## 2013-04-08 ENCOUNTER — Encounter (HOSPITAL_COMMUNITY): Payer: Self-pay | Admitting: *Deleted

## 2013-04-08 DIAGNOSIS — R079 Chest pain, unspecified: Secondary | ICD-10-CM

## 2013-04-08 DIAGNOSIS — R51 Headache: Secondary | ICD-10-CM | POA: Insufficient documentation

## 2013-04-08 DIAGNOSIS — R0602 Shortness of breath: Secondary | ICD-10-CM | POA: Insufficient documentation

## 2013-04-08 DIAGNOSIS — E669 Obesity, unspecified: Secondary | ICD-10-CM | POA: Insufficient documentation

## 2013-04-08 DIAGNOSIS — Z8739 Personal history of other diseases of the musculoskeletal system and connective tissue: Secondary | ICD-10-CM | POA: Insufficient documentation

## 2013-04-08 DIAGNOSIS — Z8639 Personal history of other endocrine, nutritional and metabolic disease: Secondary | ICD-10-CM | POA: Insufficient documentation

## 2013-04-08 DIAGNOSIS — Z79899 Other long term (current) drug therapy: Secondary | ICD-10-CM | POA: Insufficient documentation

## 2013-04-08 DIAGNOSIS — Z8619 Personal history of other infectious and parasitic diseases: Secondary | ICD-10-CM | POA: Insufficient documentation

## 2013-04-08 DIAGNOSIS — Z862 Personal history of diseases of the blood and blood-forming organs and certain disorders involving the immune mechanism: Secondary | ICD-10-CM | POA: Insufficient documentation

## 2013-04-08 DIAGNOSIS — K219 Gastro-esophageal reflux disease without esophagitis: Secondary | ICD-10-CM | POA: Insufficient documentation

## 2013-04-08 LAB — CBC WITH DIFFERENTIAL/PLATELET
Basophils Absolute: 0 10*3/uL (ref 0.0–0.1)
Basophils Relative: 0 % (ref 0–1)
Eosinophils Absolute: 0.1 10*3/uL (ref 0.0–0.7)
Eosinophils Relative: 1 % (ref 0–5)
HCT: 43.5 % (ref 39.0–52.0)
Hemoglobin: 15.5 g/dL (ref 13.0–17.0)
MCH: 32.9 pg (ref 26.0–34.0)
MCHC: 35.6 g/dL (ref 30.0–36.0)
MCV: 92.4 fL (ref 78.0–100.0)
Monocytes Absolute: 0.7 10*3/uL (ref 0.1–1.0)
Monocytes Relative: 9 % (ref 3–12)
RDW: 12.8 % (ref 11.5–15.5)

## 2013-04-08 LAB — BASIC METABOLIC PANEL
BUN: 11 mg/dL (ref 6–23)
Creatinine, Ser: 1.04 mg/dL (ref 0.50–1.35)
GFR calc non Af Amer: 81 mL/min — ABNORMAL LOW (ref >90)
Glucose, Bld: 138 mg/dL — ABNORMAL HIGH (ref 70–99)
Potassium: 3.8 mEq/L (ref 3.5–5.1)

## 2013-04-08 MED ORDER — ASPIRIN 325 MG PO TABS
325.0000 mg | ORAL_TABLET | Freq: Once | ORAL | Status: AC
  Administered 2013-04-08: 325 mg via ORAL
  Filled 2013-04-08: qty 1

## 2013-04-08 MED ORDER — NITROGLYCERIN 0.4 MG SL SUBL
0.4000 mg | SUBLINGUAL_TABLET | SUBLINGUAL | Status: DC | PRN
  Administered 2013-04-08 (×2): 0.4 mg via SUBLINGUAL

## 2013-04-08 NOTE — ED Notes (Signed)
Pt c/o left sided chest pain radiating down left arm. Pt describes pain as sharp and radiating down left arm x 15 mins.

## 2013-04-08 NOTE — ED Provider Notes (Signed)
History    This chart was scribed for Joya Gaskins, MD by Leone Payor, ED Scribe. This patient was seen in room APA05/APA05 and the patient's care was started 11:33 PM.   CSN: [id number]  Arrival date & time 04/08/13  2204   First MD Initiated Contact with Patient 04/08/13 2305      Chief Complaint  Patient presents with  . Chest Pain  . Headache     Patient is a 52 y.o. male presenting with chest pain and headaches. The history is provided by the patient. No language interpreter was used.  Chest Pain Pain quality: radiating and sharp   Pain radiates to:  L arm Pain radiates to the back: no   Pain severity:  Moderate Onset quality:  Gradual Duration:  2 hours Timing:  Intermittent Progression:  Waxing and waning Chronicity:  New Context: at rest   Associated symptoms: headache and shortness of breath   Risk factors: no aortic disease, no coronary artery disease, no diabetes mellitus, no hypertension and no prior DVT/PE   Headache   HPI Comments: Anthony Erickson is a 52 y.o. male who presents to the Emergency Department complaining of gradual onset, intermittent episodes of left sided chest pain that radiates down left arm starting about 2 hours ago. Pt describes the pain as sharp and rates it as 6/10 currently. States the pain comes and goes and lasts for more than a few minutes at a time. When the chest pain first began he had some associated SOB and mild HA which has since resolved. States breathing does not aggravate the pain. He denies history of MI, stroke, DM, HTN. Pt denies smoking and alcohol use.    Past Medical History  Diagnosis Date  . Condyloma acuminatum   . GERD (gastroesophageal reflux disease)   . Hyperlipidemia   . Obesity   . Tendinitis     Of left hand     Past Surgical History  Procedure Laterality Date  . Ctr right hand    . Appendectomy  12/13/2009  . Cholecystectomy  08/15/2010    Dr. Marlene Bast    Family History  Problem Relation Age of  Onset  . Hypertension Mother   . Diabetes Mother   . Hyperlipidemia Mother   . GER disease Father   . Hypertension Father   . Hyperlipidemia Father   . Diabetes      Family History   . Cancer      Family History   . Diabetes Sister   . Hypertension Sister   . Cancer Sister     ovarian     History  Substance Use Topics  . Smoking status: Never Smoker   . Smokeless tobacco: Not on file  . Alcohol Use: Yes     Comment: Occasional       Review of Systems  Respiratory: Positive for shortness of breath.   Cardiovascular: Positive for chest pain.  Neurological: Positive for headaches.  All other systems reviewed and are negative.    Allergies  Review of patient's allergies indicates no known allergies.  Home Medications   Current Outpatient Rx  Name  Route  Sig  Dispense  Refill  . aspirin (ASPIRIN ADULT LOW STRENGTH) 81 MG EC tablet   Oral   Take 81 mg by mouth daily. Take one tablet by mouth once a day          . Multiple Vitamin (MULTI VITAMIN MENS PO)   Oral   Take by  mouth daily. Take one tablet by mouth once a day           . Omega-3 Fatty Acids (FISH OIL) 1000 MG CAPS   Oral   Take 1,000 mg by mouth daily.          Marland Kitchen omeprazole (PRILOSEC) 20 MG capsule   Oral   Take 20 mg by mouth daily.         . temazepam (RESTORIL) 15 MG capsule   Oral   Take 15 mg by mouth at bedtime as needed for sleep.           BP 137/91  Pulse 102  Temp(Src) 97.4 F (36.3 C) (Oral)  Resp 20  Ht 5\' 6"  (1.676 m)  Wt 220 lb (99.791 kg)  BMI 35.53 kg/m2  SpO2 100%  Physical Exam  CONSTITUTIONAL: Well developed/well nourished HEAD: Normocephalic/atraumatic EYES: EOMI/PERRL ENMT: Mucous membranes moist NECK: supple no meningeal signs SPINE:entire spine nontender CV: S1/S2 noted, no murmurs/rubs/gallops noted LUNGS: Lungs are clear to auscultation bilaterally, no apparent distress ABDOMEN: soft, nontender, no rebound or guarding GU:no cva  tenderness NEURO: Pt is awake/alert, moves all extremitiesx4 EXTREMITIES: pulses normal, full ROM, no calf tenderness, no LE edema SKIN: warm, color normal PSYCH: no abnormalities of mood noted  ED Course  Procedures   DIAGNOSTIC STUDIES: Oxygen Saturation is 100% on room air, normal by my interpretation.    COORDINATION OF CARE: 11:33 PM Discussed treatment plan with pt at bedside and pt agreed to plan.   Pt felt improved in the ED after receiving ASA/NTG and had no further CP He has never had an MI before.  He thinks this is all due to stress He is well appearing, however I discussed extensively the limitations of one EKG/Troponin in the evaluation of ACS and that this could worsen over the next 6-12 hours I doubt this is Acute PE/Dissection I advised him the need to stay at least another 3 hrs to observe for any recurrent CP and to recheck troponin to ensure no dynamic change He requests discharge home.  I advised him to return for any worsening pain/short of breath/weakness over the next 12 hours I also referred him to cardiology as well and advised him to return at any time for recurrence of symptoms   Labs Reviewed  CBC WITH DIFFERENTIAL - Abnormal; Notable for the following:    Neutrophils Relative % 35 (*)    Lymphocytes Relative 55 (*)    Lymphs Abs 4.2 (*)    All other components within normal limits  TROPONIN I  BASIC METABOLIC PANEL   MDM  Nursing notes including past medical history and social history reviewed and considered in documentation xrays reviewed and considered Labs/vital reviewed and considered     Date: 04/09/2013  Rate: 97  Rhythm: normal sinus rhythm  QRS Axis: normal  Intervals: normal  ST/T Wave abnormalities: normal  Conduction Disutrbances:none      I personally performed the services described in this documentation, which was scribed in my presence. The recorded information has been reviewed and is accurate.         Joya Gaskins, MD 04/09/13 [phone]

## 2013-04-10 ENCOUNTER — Telehealth: Payer: Self-pay | Admitting: Family Medicine

## 2013-04-10 NOTE — Telephone Encounter (Signed)
Called this am stating he was in the Ed for chest pain on the weekend, given a2 to 3 days to "rest" and had appt with card this am. Had a question as to safety top drive himself I advised that since he was out with cghest pain he should get faMILY/FRIEND TO ASSIST WITH DRIVING FOR NOW. Stated his brother would transpt him. Chest pain had improved with pain puill which made him sleepy

## 2013-04-28 ENCOUNTER — Encounter: Payer: Self-pay | Admitting: *Deleted

## 2013-05-01 ENCOUNTER — Encounter: Payer: Self-pay | Admitting: Internal Medicine

## 2013-05-01 ENCOUNTER — Ambulatory Visit (INDEPENDENT_AMBULATORY_CARE_PROVIDER_SITE_OTHER): Payer: BC Managed Care – PPO | Admitting: Internal Medicine

## 2013-05-01 VITALS — BP 151/81 | HR 111 | Ht 67.0 in | Wt 230.0 lb

## 2013-05-01 DIAGNOSIS — I10 Essential (primary) hypertension: Secondary | ICD-10-CM

## 2013-05-01 NOTE — Patient Instructions (Addendum)
Your physician recommends that you schedule a follow-up appointment in: AS NEEDED  

## 2013-05-01 NOTE — Progress Notes (Signed)
HPI Patient is a 52 yo who was referred from the ER for evaluation of CP Seen on 6/7 for CP  Stressful day  Went home Doing OK with no signif pain until yesterday.   Yesterday afternoon watchgin TV  Had dinner  Few twinges hit  Lasted a couple seconds then fine Patient walks a lot.  No problems. Walks at good pace.  1 hour. Can't sleep   No breathing problem No dizzy  No syncope Breathing OK  Patinet was seen by [facility] in past.  Had echo that showed boderline LVH  Normal LVEF  Cardiolite showed no ischemia (2007)  Sleep study showed moderate sleep apnea. He was seen again in 2009  Reported to have another echo and stress [employer] available.   No Known Allergies  Current Outpatient Prescriptions  Medication Sig Dispense Refill  . aspirin (ASPIRIN ADULT LOW STRENGTH) 81 MG EC tablet Take 81 mg by mouth daily. Take one tablet by mouth once a day       . Multiple Vitamin (MULTI VITAMIN MENS PO) Take by mouth daily. Take one tablet by mouth once a day        . Omega-3 Fatty Acids (FISH OIL) 1000 MG CAPS Take 1,000 mg by mouth daily.       Marland Kitchen omeprazole (PRILOSEC) 20 MG capsule Take 20 mg by mouth daily.      . temazepam (RESTORIL) 15 MG capsule Take 15 mg by mouth at bedtime as needed for sleep.       No current facility-administered medications for this visit.    Past Medical History  Diagnosis Date  . Condyloma acuminatum   . GERD (gastroesophageal reflux disease)   . Hyperlipidemia   . Obesity   . Tendinitis     Of left hand     Past Surgical History  Procedure Laterality Date  . Ctr right hand    . Appendectomy  12/13/2009  . Cholecystectomy  08/15/2010    Dr. Marlene Bast    Family History  Problem Relation Age of Onset  . Hypertension Mother   . Diabetes Mother   . Hyperlipidemia Mother   . GER disease Father   . Hypertension Father   . Hyperlipidemia Father   . Diabetes      Family History   . Cancer      Family History   . Diabetes Sister   . Hypertension  Sister   . Cancer Sister     ovarian     History   Social History  . Marital Status: Single    Spouse Name: N/A    Number of Children: N/A  . Years of Education: N/A   Occupational History  . Deli at [employer]     Social History Main Topics  . Smoking status: Never Smoker   . Smokeless tobacco: Not on file  . Alcohol Use: Yes     Comment: Occasional   . Drug Use: No  . Sexually Active: Not on file   Other Topics Concern  . Not on file   Social History Narrative  . No narrative on file    Review of Systems:  All systems reviewed.  They are negative to the above problem except as previously stated.  Vital Signs: BP 151/81  Pulse 111  Ht 5\' 7"  (1.702 m)  Wt 230 lb (104.327 kg)  BMI 36.01 kg/m2 BP with large cuff is 124/86.  Initial BP was with regular cuff.  Physical Exam Patient  is in NAD HEENT:  Normocephalic, atraumatic. EOMI, PERRLA.  Neck: JVP is normal.  No bruits.  Lungs: clear to auscultation. No rales no wheezes.  Heart: Regular rate and rhythm. Normal S1, S2. No S3.   No significant murmurs. PMI not displaced.  Abdomen:  Supple, nontender. Normal bowel sounds. No masses. No hepatomegaly.  Extremities:   Good distal pulses throughout. No lower extremity edema.  Musculoskeletal :moving all extremities.  Neuro:   alert and oriented x3.  CN II-XII grossly intact.  EKG  ST 115 bpm  Nonspecific ST T wave changes. Assessment and Plan:  1.  CP  Atypical  Question GI  I would not pursue further cardiac testing at this time unless changes  2.  HTN  BP improved on recheck  WIll f/u in clinic as needed.

## 2013-05-09 ENCOUNTER — Encounter: Payer: Self-pay | Admitting: Internal Medicine

## 2013-05-19 ENCOUNTER — Ambulatory Visit: Payer: BC Managed Care – PPO | Admitting: Urology

## 2013-06-21 ENCOUNTER — Other Ambulatory Visit: Payer: Self-pay | Admitting: Family Medicine

## 2013-06-21 LAB — LIPID PANEL
Cholesterol: 174 mg/dL (ref 0–200)
LDL Cholesterol: 112 mg/dL — ABNORMAL HIGH (ref 0–99)
Total CHOL/HDL Ratio: 4.8 Ratio
VLDL: 26 mg/dL (ref 0–40)

## 2013-06-23 ENCOUNTER — Ambulatory Visit (INDEPENDENT_AMBULATORY_CARE_PROVIDER_SITE_OTHER): Payer: BC Managed Care – PPO | Admitting: Urology

## 2013-06-23 DIAGNOSIS — D400 Neoplasm of uncertain behavior of prostate: Secondary | ICD-10-CM

## 2013-06-23 DIAGNOSIS — R972 Elevated prostate specific antigen [PSA]: Secondary | ICD-10-CM

## 2013-07-25 ENCOUNTER — Ambulatory Visit: Payer: BC Managed Care – PPO | Admitting: Family Medicine

## 2013-08-10 ENCOUNTER — Ambulatory Visit: Payer: BC Managed Care – PPO | Admitting: Family Medicine

## 2013-08-30 ENCOUNTER — Other Ambulatory Visit: Payer: Self-pay | Admitting: Family Medicine

## 2013-09-13 ENCOUNTER — Ambulatory Visit: Payer: BC Managed Care – PPO | Admitting: Family Medicine

## 2013-10-09 ENCOUNTER — Ambulatory Visit (INDEPENDENT_AMBULATORY_CARE_PROVIDER_SITE_OTHER): Payer: BC Managed Care – PPO | Admitting: Family Medicine

## 2013-10-09 ENCOUNTER — Encounter (INDEPENDENT_AMBULATORY_CARE_PROVIDER_SITE_OTHER): Payer: Self-pay

## 2013-10-09 ENCOUNTER — Encounter: Payer: Self-pay | Admitting: Family Medicine

## 2013-10-09 VITALS — BP 110/70 | HR 100 | Resp 18 | Ht 64.5 in | Wt 231.0 lb

## 2013-10-09 DIAGNOSIS — R7309 Other abnormal glucose: Secondary | ICD-10-CM

## 2013-10-09 DIAGNOSIS — K219 Gastro-esophageal reflux disease without esophagitis: Secondary | ICD-10-CM

## 2013-10-09 DIAGNOSIS — M899 Disorder of bone, unspecified: Secondary | ICD-10-CM

## 2013-10-09 DIAGNOSIS — G4700 Insomnia, unspecified: Secondary | ICD-10-CM

## 2013-10-09 DIAGNOSIS — R5381 Other malaise: Secondary | ICD-10-CM

## 2013-10-09 DIAGNOSIS — R972 Elevated prostate specific antigen [PSA]: Secondary | ICD-10-CM

## 2013-10-09 DIAGNOSIS — E785 Hyperlipidemia, unspecified: Secondary | ICD-10-CM

## 2013-10-09 DIAGNOSIS — E669 Obesity, unspecified: Secondary | ICD-10-CM

## 2013-10-09 DIAGNOSIS — R7302 Impaired glucose tolerance (oral): Secondary | ICD-10-CM

## 2013-10-09 MED ORDER — TEMAZEPAM 30 MG PO CAPS: 30.0000 mg | ORAL_CAPSULE | Freq: Every evening | ORAL | Status: DC | PRN

## 2013-10-09 NOTE — Patient Instructions (Addendum)
F/u with rectal early May, call if you need me before  Cholesterol is improving and BP is excellent  Cut back on portion size and keep active so you lose weight and build up your good cholesterol  Dose increase on restoril as discussed  Practice good sleeep hygiene  Fasting lipid, HBA1C, cmp, TSH, vit D in May before f/u

## 2013-10-09 NOTE — Progress Notes (Signed)
   Subjective:    Patient ID: Anthony Erickson, male    DOB: 01/20/1961, 52 y.o.   MRN: [id number]  HPI The PT is here for follow up and re-evaluation of chronic medical conditions, medication management and review of any available recent lab and radiology data.  Preventive health is updated, specifically  Cancer screening and Immunization.   Questions or concerns regarding consultations or procedures which the PT has had in the interim are  Addressed.Recently had surgery to perianal warts which was succesful The PT denies any adverse reactions to current medications since the last visit.  There are no new concerns.  There are no specific complaints         Review of Systems See HPI Denies recent fever or chills. Denies sinus pressure, nasal congestion, ear pain or sore throat. Denies chest congestion, productive cough or wheezing. Denies chest pains, palpitations and leg swelling Denies abdominal pain, nausea, vomiting,diarrhea or constipation.   Denies dysuria, frequency, hesitancy or incontinence. Denies joint pain, swelling and limitation in mobility. Denies headaches, seizures, numbness, or tingling. Denies depression, anxiety or insomnia. Denies skin break down or rash.        Objective:   Physical Exam  Patient alert and oriented and in no cardiopulmonary distress.  HEENT: No facial asymmetry, EOMI, no sinus tenderness,  oropharynx pink and moist.  Neck supple no adenopathy.  Chest: Clear to auscultation bilaterally.  CVS: S1, S2 no murmurs, no S3.  ABD: Soft non tender. Bowel sounds normal.  Ext: No edema  MS: Adequate ROM spine, shoulders, hips and knees.  Skin: Intact, no ulcerations or rash noted.  Psych: Good eye contact, normal affect. Memory intact not anxious or depressed appearing.  CNS: CN 2-12 intact, power, tone and sensation normal throughout.       Assessment & Plan:

## 2013-10-15 ENCOUNTER — Encounter: Payer: Self-pay | Admitting: Family Medicine

## 2013-10-15 NOTE — Assessment & Plan Note (Signed)
Unchanged. Patient re-educated about  the importance of commitment to a  minimum of 150 minutes of exercise per week. The importance of healthy food choices with portion control discussed. Encouraged to start a food diary, count calories and to consider  joining a support group. Sample diet sheets offered. Goals set by the patient for the next several months.    

## 2013-10-15 NOTE — Assessment & Plan Note (Signed)
Controlled, no change in medication  

## 2013-10-15 NOTE — Assessment & Plan Note (Signed)
Hyperlipidemia:Low fat diet discussed and encouraged.  Updated lab for next visit 

## 2013-10-15 NOTE — Assessment & Plan Note (Signed)
Inadequate control though improved with medication dose increase in med Sleep hygiene reviewed

## 2013-11-22 ENCOUNTER — Encounter (HOSPITAL_COMMUNITY): Payer: Self-pay | Admitting: Emergency Medicine

## 2013-11-22 ENCOUNTER — Emergency Department (HOSPITAL_COMMUNITY): Payer: BC Managed Care – PPO

## 2013-11-22 ENCOUNTER — Emergency Department (HOSPITAL_COMMUNITY)
Admission: EM | Admit: 2013-11-22 | Discharge: 2013-11-22 | Disposition: A | Discharge status: Home / Self Care | Payer: BC Managed Care – PPO | Attending: Emergency Medicine | Admitting: Emergency Medicine

## 2013-11-22 DIAGNOSIS — Z7982 Long term (current) use of aspirin: Secondary | ICD-10-CM | POA: Insufficient documentation

## 2013-11-22 DIAGNOSIS — M19079 Primary osteoarthritis, unspecified ankle and foot: Secondary | ICD-10-CM | POA: Insufficient documentation

## 2013-11-22 DIAGNOSIS — E669 Obesity, unspecified: Secondary | ICD-10-CM | POA: Insufficient documentation

## 2013-11-22 DIAGNOSIS — M7730 Calcaneal spur, unspecified foot: Secondary | ICD-10-CM | POA: Insufficient documentation

## 2013-11-22 DIAGNOSIS — K219 Gastro-esophageal reflux disease without esophagitis: Secondary | ICD-10-CM | POA: Insufficient documentation

## 2013-11-22 DIAGNOSIS — Z8619 Personal history of other infectious and parasitic diseases: Secondary | ICD-10-CM | POA: Insufficient documentation

## 2013-11-22 DIAGNOSIS — M79671 Pain in right foot: Secondary | ICD-10-CM

## 2013-11-22 DIAGNOSIS — M7731 Calcaneal spur, right foot: Secondary | ICD-10-CM

## 2013-11-22 DIAGNOSIS — Z79899 Other long term (current) drug therapy: Secondary | ICD-10-CM | POA: Insufficient documentation

## 2013-11-22 LAB — URIC ACID: URIC ACID, SERUM: 6.6 mg/dL (ref 4.0–7.8)

## 2013-11-22 MED ORDER — NAPROXEN 500 MG PO TABS: 500.0000 mg | ORAL_TABLET | Freq: Two times a day (BID) | ORAL | Status: DC

## 2013-11-22 MED ORDER — OXYCODONE-ACETAMINOPHEN 5-325 MG PO TABS
1.0000 | ORAL_TABLET | ORAL | Status: DC | PRN
Start: 2013-11-22 — End: 2014-01-24

## 2013-11-22 MED ORDER — IBUPROFEN 800 MG PO TABS
800.0000 mg | ORAL_TABLET | Freq: Once | ORAL | Status: AC
  Administered 2013-11-22: 800 mg via ORAL
  Filled 2013-11-22: qty 1

## 2013-11-22 MED ORDER — OXYCODONE-ACETAMINOPHEN 5-325 MG PO TABS
1.0000 | ORAL_TABLET | Freq: Once | ORAL | Status: AC
  Administered 2013-11-22: 1 via ORAL
  Filled 2013-11-22: qty 1

## 2013-11-22 NOTE — ED Notes (Signed)
Pt c/o right foot pain x 2 days. Denies injury.  

## 2013-11-22 NOTE — ED Notes (Signed)
Pt denies injury to right foot but with constant pain and radiating and sharp pain with ambulation, good pedal pulse to right foot

## 2013-11-22 NOTE — ED Provider Notes (Signed)
  Medical screening examination/treatment/procedure(s) were performed by non-physician practitioner and as supervising physician I was immediately available for consultation/collaboration.      Emmely Bittinger, MD 11/22/13 1533 

## 2013-11-22 NOTE — Discharge Instructions (Signed)
Heel Spur °A heel spur is a hook of bone that can form on the calcaneus (the heel bone and the largest bone of the foot). Heel spurs are often associated with plantar fasciitis and usually come in people who have had the problem for an extended period of time. The cause of the relationship is unknown. The pain associated with them is thought to be caused by an inflammation (soreness and redness) of the plantar fascia rather than the spur itself. The plantar fascia is a thick fibrous like tissue that runs from the calcaneus (heel bone) to the ball of the foot. This strong, tight tissue helps maintain the arch of your foot. It helps distribute the weight across your foot as you walk or run. Stresses placed on the plantar fascia can be tremendous. When it is inflamed normal activities become painful. Pain is worse in the morning after sleeping. After sleeping the plantar fascia is tight. The first movements stretch the fascia and this causes pain. As the tendon loosens, the pain usually gets better. It often returns with too much standing or walking.  °About 70% of patients with plantar fasciitis have a heel spur. About half of people without foot pain also have heel spurs. °DIAGNOSIS  °The diagnosis of a heel spur is made by X-ray. The X-ray shows a hook of bone protruding from the bottom of the calcaneus at the point where the plantar fascia is attached to the heel bone.  °TREATMENT °· It is necessary to find out what is causing the stretching of the plantar fascia. If the cause is over-pronation (flat feet), orthotics and proper foot ware may help. °· Stretching exercises, losing weight, wearing shoes that have a cushioned heel that absorbs shock, and elevating the heel with the use of a heel cradle, heel cup, or orthotics may all help. Heel cradles and heel cups provide extra comfort and cushion to the heel, and reduce the amount of shock to the sore area. °AVOIDING THE PAIN OF PLANTAR FASCIITIS AND HEEL  SPURS °· Consult a sports medicine professional before beginning a new exercise program. °· Walking programs offer a good workout. There is a lower chance of overuse injuries common to the runners. There is less impact and less jarring of the joints. °· Begin all new exercise programs slowly. If problems or pains develop, decrease the amount of time or distance until you are at a comfortable level. °· Wear good shoes and replace them regularly. °· Stretch your foot and the heel cords at the back of the ankle (Achilles tendons) both before and after exercise. °· Run or exercise on even surfaces that are not hard. For example, asphalt is better than pavement. °· Do not run barefoot on hard surfaces. °· If using a treadmill, vary the incline. °· Do not continue to workout if you have foot or joint problems. Seek professional help if they do not improve. °HOME CARE INSTRUCTIONS  °· Avoid activities that cause you pain until you recover. °· Use ice or cold packs to the problem or painful areas after working out. °· Only take over-the-counter or prescription medicines for pain, discomfort, or fever as directed by your caregiver. °· Soft shoe inserts or athletic shoes with air or gel sole cushions may be helpful. °· If problems continue or become more severe, consult a sports medicine caregiver. Cortisone is a potent anti-inflammatory medication that may be injected into the painful area. You can discuss this treatment with your caregiver. °MAKE SURE YOU:  °·   Understand these instructions.  Will watch your condition.  Will get help right away if you are not doing well or get worse. Document Released: 11/25/2005 Document Revised: 01/11/2012 Document Reviewed: 01/27/2006 [facility] Patient Information 2014 [place].

## 2013-11-22 NOTE — ED Notes (Signed)
Pt verbalized understanding of no driving and use caution with use of Percocet and of possible constipation with use

## 2013-11-22 NOTE — ED Provider Notes (Signed)
CSN: [id number]     Arrival date & time 11/22/13  1121 History   First MD Initiated Contact with Patient 11/22/13 1140     Chief Complaint  Patient presents with  . Foot Pain   (Consider location/radiation/quality/duration/timing/severity/associated sxs/prior Treatment) Patient is a 53 y.o. male presenting with lower extremity pain. The history is provided by the patient.  Foot Pain This is a new problem. Episode onset: 2 days. The problem occurs constantly. The problem has been unchanged. Associated symptoms include arthralgias. Pertinent negatives include no chills, fever, headaches, joint swelling, myalgias, numbness, rash or vomiting. The symptoms are aggravated by standing and walking. He has tried NSAIDs for the symptoms. The treatment provided no relief.   Patient complains of severe pain to the plantar surface of the right foot into the first metatarsal joint. He states pain is present for 2 days. He denies known injury. He also denies swelling, redness, or numbness of the foot. He's tried over-the-counter medications without relief.  He states he has an appointment with an orthopedic physician for next week.  Past Medical History  Diagnosis Date  . Condyloma acuminatum   . GERD (gastroesophageal reflux disease)   . Hyperlipidemia   . Obesity   . Tendinitis     Of left hand    Past Surgical History  Procedure Laterality Date  . Ctr right hand    . Appendectomy  12/13/2009  . Cholecystectomy  08/15/2010    Dr. Cornelia Copa   Family History  Problem Relation Age of Onset  . Hypertension Mother   . Diabetes Mother   . Hyperlipidemia Mother   . GER disease Father   . Hypertension Father   . Hyperlipidemia Father   . Diabetes      Family History   . Cancer      Family History   . Diabetes Sister   . Hypertension Sister   . Cancer Sister     ovarian    History  Substance Use Topics  . Smoking status: Never Smoker   . Smokeless tobacco: Not on file  . Alcohol Use: Yes    Comment: Occasional     Review of Systems  Constitutional: Negative for fever and chills.  Gastrointestinal: Negative for vomiting.  Genitourinary: Negative for dysuria and difficulty urinating.  Musculoskeletal: Positive for arthralgias. Negative for joint swelling and myalgias.       Right foot pain  Skin: Negative for color change, rash and wound.  Neurological: Negative for numbness and headaches.  All other systems reviewed and are negative.    Allergies  Review of patient's allergies indicates no known allergies.  Home Medications   Current Outpatient Rx  Name  Route  Sig  Dispense  Refill  . aspirin (ASPIRIN ADULT LOW STRENGTH) 81 MG EC tablet   Oral   Take 81 mg by mouth daily. Take one tablet by mouth once a day          . Multiple Vitamin (MULTI VITAMIN MENS PO)   Oral   Take by mouth daily. Take one tablet by mouth once a day           . Omega-3 Fatty Acids (FISH OIL) 1000 MG CAPS   Oral   Take 1,000 mg by mouth daily.          Marland Kitchen omeprazole (PRILOSEC) 20 MG capsule   Oral   Take 20 mg by mouth daily.         . temazepam (RESTORIL) 30 MG  capsule   Oral   Take 1 capsule (30 mg total) by mouth at bedtime as needed for sleep.   30 capsule   5     Dose increase effective 10/09/2013   . naproxen (NAPROSYN) 500 MG tablet   Oral   Take 1 tablet (500 mg total) by mouth 2 (two) times daily. Take with food   20 tablet   0   . oxyCODONE-acetaminophen (PERCOCET/ROXICET) 5-325 MG per tablet   Oral   Take 1 tablet by mouth every 4 (four) hours as needed for severe pain.   15 tablet   0    BP 152/86  Pulse 99  Temp(Src) 98.1 F (36.7 C)  Resp 18  Ht 5\' 6"  (1.676 m)  Wt 225 lb (102.059 kg)  BMI 36.33 kg/m2  SpO2 100%  Physical Exam  Nursing note and vitals reviewed. Constitutional: He is oriented to person, place, and time. He appears well-developed and well-nourished. No distress.  HENT:  Head: Normocephalic and atraumatic.    Cardiovascular: Normal rate, regular rhythm, normal heart sounds and intact distal pulses.   Pulmonary/Chest: Effort normal and breath sounds normal.  Musculoskeletal: He exhibits tenderness. He exhibits no edema.       Right foot: He exhibits tenderness. He exhibits no swelling, normal capillary refill, no crepitus, no deformity and no laceration.       Feet:  Localized tenderness to palpation over the plantar surface of the right foot and at the first metatarsal joint.  ROM is preserved.  DP pulse is brisk,distal sensation intact.  No erythema, edema, bruising or bony deformity.  No proximal tenderness.  Neurological: He is alert and oriented to person, place, and time. He exhibits normal muscle tone. Coordination normal.  Skin: Skin is warm and dry.    ED Course  Procedures (including critical care time) Labs Review Labs Reviewed  URIC ACID   Imaging Review Dg Foot Complete Right  11/22/2013   CLINICAL DATA:  Pain  EXAM: RIGHT FOOT COMPLETE - 3+ VIEW  COMPARISON:  None.  FINDINGS: Frontal, oblique, and lateral views were obtained. There is no fracture or dislocation. There is mild narrowing of the first MTP joint. There is mild spurring in the dorsal midfoot. Joint spaces elsewhere appear intact. There is a spur arising from the inferior calcaneus. No erosive change.  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 Mild spurring dorsal midfoot. Inferior calcaneal spur. No fracture or dislocation. No erosive change.   Electronically Signed   By: Lowella Grip M.D.   On: 11/22/2013 12:35    EKG Interpretation   None       MDM   1. Calcaneal spur of right foot   2. Foot pain, right     X-ray reveals calcaneal spurring and mild osteoarthritis of the foot. No concerning symptoms or open wounds to indicate cellulitis. Patient agrees to symptomatic treatment with postop shoe, naproxen, and #15 Percocet for pain. He has an appointment with Dr. Aline Brochure for next  week.  Patient remains neurovascularly intact and appears stable for discharge.   Kynlie Jane L. Jacci Ruberg, PA-C 11/22/13 1525

## 2013-11-29 ENCOUNTER — Encounter: Payer: Self-pay | Admitting: Orthopedic Surgery

## 2013-11-29 ENCOUNTER — Ambulatory Visit (INDEPENDENT_AMBULATORY_CARE_PROVIDER_SITE_OTHER): Payer: BC Managed Care – PPO | Admitting: Orthopedic Surgery

## 2013-11-29 VITALS — BP 136/87 | Ht 67.0 in | Wt 228.0 lb

## 2013-11-29 DIAGNOSIS — M21619 Bunion of unspecified foot: Secondary | ICD-10-CM

## 2013-11-29 DIAGNOSIS — M19079 Primary osteoarthritis, unspecified ankle and foot: Secondary | ICD-10-CM | POA: Insufficient documentation

## 2013-11-29 MED ORDER — NAPROXEN 500 MG PO TABS: 500.0000 mg | ORAL_TABLET | Freq: Two times a day (BID) | ORAL | Status: DC

## 2013-11-29 NOTE — Patient Instructions (Addendum)
Wear boot x 4 weeks  Continue oxycodone until it runs out  Continue naproxen  Apply capzacin cream 3 times a day [this is an over the counter medicine ] Sit every 1-2 hours for 15 min x next 4 weeks

## 2013-11-29 NOTE — Progress Notes (Signed)
Patient ID: Anthony Erickson., male   DOB: [birth year], 53 y.o.   MRN: [id number] Chief Complaint  Patient presents with  . Foot Pain    Right foot pain   Foot Pain   today is a 53 year old male presents with spontaneous onset of pain over the right great toe surrounding the metatarsophalangeal joint with throbbing stabbing burning pain causing him to let associated with tingling numbness and swelling. 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 CDV: pulses are normal   3. Skin: normal  4. Lymph: nodes were not palpable/normal  5/6. Psychiatric: awake, alert and oriented, mood and affect normal   7. Neuro: normal sensation  8.   MSK  Gait: Slight limp favors the involved digit 9.   Inspection there is a dorsal bunion with decreased extension of the great toe and painful range of motion. Muscle tone normal without atrophy toe is stable dorsal bunion suspected   Imaging x-ray shows arthritis around the great toe  Assessment:  Encounter Diagnoses  Name Primary?  . Bunion of great toe Yes  . 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  Mild spurring dorsal midfoot. Inferior calcaneal spur. No fracture or dislocation. No erosive change.

## 2013-12-28 ENCOUNTER — Ambulatory Visit: Payer: BC Managed Care – PPO | Admitting: Orthopedic Surgery

## 2014-01-10 ENCOUNTER — Encounter: Payer: Self-pay | Admitting: Orthopedic Surgery

## 2014-01-10 ENCOUNTER — Ambulatory Visit (INDEPENDENT_AMBULATORY_CARE_PROVIDER_SITE_OTHER): Payer: BC Managed Care – PPO | Admitting: Orthopedic Surgery

## 2014-01-10 VITALS — BP 137/92 | Ht 67.0 in | Wt 228.0 lb

## 2014-01-10 DIAGNOSIS — M19079 Primary osteoarthritis, unspecified ankle and foot: Secondary | ICD-10-CM

## 2014-01-10 NOTE — Progress Notes (Signed)
Patient ID: Anthony Rhymes., male   DOB: 04-05-1961, 53 y.o.   MRN: [id number]  Chief Complaint  Patient presents with  . Follow-up    4 week recheck right great toe   BP 137/92  Ht 5\' 7"  (1.702 m)  Wt 228 lb (103.42 kg)  BMI 35.70 kg/m2  Recheck right great toe pain stiffness patient had boot did not improve significantly although his heel pain got better  Continues with tenderness over the dorsum of the foot has a prominent great toe bunion on the dorsal aspect with painful range of motion and swelling.  He is interested in further treatment. He should probably have a cheilectomy. He understands to be out of work for 3-6 weeks and be in a boot for 6 weeks  Past Medical History  Diagnosis Date  . Condyloma acuminatum   . GERD (gastroesophageal reflux disease)   . Hyperlipidemia   . Obesity   . Tendinitis     Of left hand    Dorsal bunion  Cheilectomy right great toe to schedule at his convenience

## 2014-01-10 NOTE — Patient Instructions (Signed)
Surgery to be scheduled for 01/24/14

## 2014-01-11 ENCOUNTER — Other Ambulatory Visit: Payer: Self-pay | Admitting: *Deleted

## 2014-01-11 ENCOUNTER — Telehealth: Payer: Self-pay | Admitting: Orthopedic Surgery

## 2014-01-11 NOTE — Telephone Encounter (Signed)
Per phone call to [facility] [phone], spoke with Tonna Corner., no pre authorization needed for CPT 28289 Reference # [id number]

## 2014-01-18 ENCOUNTER — Encounter (HOSPITAL_COMMUNITY): Payer: Self-pay

## 2014-01-18 ENCOUNTER — Encounter (HOSPITAL_COMMUNITY)
Admission: RE | Admit: 2014-01-18 | Discharge: 2014-01-18 | Disposition: A | Discharge status: Home / Self Care | Payer: BC Managed Care – PPO | Source: Ambulatory Visit | Attending: Orthopedic Surgery | Admitting: Orthopedic Surgery

## 2014-01-18 ENCOUNTER — Telehealth: Payer: Self-pay | Admitting: Orthopedic Surgery

## 2014-01-18 DIAGNOSIS — Z01812 Encounter for preprocedural laboratory examination: Secondary | ICD-10-CM | POA: Insufficient documentation

## 2014-01-18 LAB — CBC
HCT: 43 % (ref 39.0–52.0)
Hemoglobin: 14.7 g/dL (ref 13.0–17.0)
MCH: 32.1 pg (ref 26.0–34.0)
MCHC: 34.2 g/dL (ref 30.0–36.0)
MCV: 93.9 fL (ref 78.0–100.0)
PLATELETS: 207 10*3/uL (ref 150–400)
RBC: 4.58 MIL/uL (ref 4.22–5.81)
RDW: 13.1 % (ref 11.5–15.5)
WBC: 5.6 10*3/uL (ref 4.0–10.5)

## 2014-01-18 LAB — BASIC METABOLIC PANEL
BUN: 19 mg/dL (ref 6–23)
CHLORIDE: 105 meq/L (ref 96–112)
CO2: 30 meq/L (ref 19–32)
CREATININE: 1.2 mg/dL (ref 0.50–1.35)
Calcium: 9.4 mg/dL (ref 8.4–10.5)
GFR calc non Af Amer: 68 mL/min — ABNORMAL LOW (ref >90)
GFR, EST AFRICAN AMERICAN: 79 mL/min — AB (ref >90)
Glucose, Bld: 147 mg/dL — ABNORMAL HIGH (ref 70–99)
POTASSIUM: 4.4 meq/L (ref 3.7–5.3)
SODIUM: 144 meq/L (ref 137–147)

## 2014-01-18 NOTE — Telephone Encounter (Signed)
Call received from Day [facility] at [place], states patient is there now for his pre-op.  Asking if patient needs to stop his 81-mg aspirin prior to surgery?  Please call [phone].

## 2014-01-18 NOTE — Patient Instructions (Signed)
Anthony Erickson.  01/18/2014   Your procedure is scheduled on:  01/24/2014  Report to [facility] at 7:00 AM.  Call this number if you have problems the morning of surgery: [phone]   Remember:   Do not eat food or drink liquids after midnight.   Take these medicines the morning of surgery with A SIP OF WATER: prilosec  Do not wear jewelry, make-up or nail polish.  Do not wear lotions, powders, or perfumes. You may wear deodorant.  Do not shave 48 hours prior to surgery. Men may shave face and neck.  Do not bring valuables to the hospital.  [facility] is not responsible                  for any belongings or valuables.               Contacts, dentures or bridgework may not be worn into surgery.  Leave suitcase in the car. After surgery it may be brought to your room.  For patients admitted to the hospital, discharge time is determined by your                treatment team.               Patients discharged the day of surgery will not be allowed to drive  home.  Name and phone number of your driver: nephew Issak Goley  Special Instructions: Shower using CHG 2 nights before surgery and the night before surgery.  If you shower the day of surgery use CHG.  Use special wash - you have one bottle of CHG for all showers.  You should use approximately 1/3 of the bottle for each shower.   Please read over the following fact sheets that you were given: Care and Recovery After Surgery

## 2014-01-18 NOTE — Telephone Encounter (Signed)
Stop 7 days prior to surgery correct?

## 2014-01-19 ENCOUNTER — Encounter (HOSPITAL_COMMUNITY): Payer: Self-pay | Admitting: Pharmacy Technician

## 2014-01-19 NOTE — Telephone Encounter (Signed)
Relayed to [place] that Dr. Aline Brochure said that Anthony Erickson did not have to stop 81 mg ASA, as for what he was doing, but that she would need to ask Dr. Patsey Berthold because he may want him to stop it for anesthesia purposes.

## 2014-01-23 NOTE — H&P (Signed)
Anthony Hope Holst. is an 53 y.o. male.   Chief Complaint   Patient presents with   .  Foot Pain       Right foot pain    Foot Pain   today is a 53 year old male presents with spontaneous onset of pain over the right great toe surrounding the metatarsophalangeal joint with throbbing stabbing burning pain causing him to let associated with tingling numbness and swelling. He is on naproxen and OxyContin with moderate relief he is also taking it easy at work  We treated him with topical anti-inflammatory and Cam Walker and he did not improve. He is having significant disability from this and would like surgical intervention and we have decided on a cheilectomy. The risks and benefits of the procedure and complications were discussed and understood by the patient.  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 Condyloma acuminatum   . GERD (gastroesophageal reflux disease)   . Hyperlipidemia   . Obesity   . Tendinitis     Of left hand     Past Surgical History  Procedure Laterality Date  . Ctr right hand    . Appendectomy  12/13/2009  . Cholecystectomy  08/15/2010    Dr. Cornelia Copa    Family History  Problem Relation Age of Onset  . Hypertension Mother   . Diabetes Mother   . Hyperlipidemia Mother   . GER disease Father   . Hypertension Father   . Hyperlipidemia Father   . Diabetes      Family History   . Cancer      Family History   . Diabetes Sister   . Hypertension Sister   . Cancer Sister     ovarian    Social History:  reports that he has  never smoked. He does not have any smokeless tobacco history on file. He reports that he drinks alcohol. He reports that he does not use illicit drugs.  Allergies: No Known Allergies  No prescriptions prior to admission    No results found for this or any previous visit (from the past 48 hour(s)). No results found.  ROS  There were no vitals taken for this visit. Physical Exam  Vital signs: BP 136/87  Ht 5\' 7"  (1.702 m)  Wt 228 lb (103.42 kg)  BMI 35.70 kg/m2   1.GENERAL: normal development   2. CDV: pulses are normal   3. Skin: normal  4. Lymph: nodes were not palpable/normal  5/6. Psychiatric: awake, alert and oriented, mood and affect normal   7. Neuro: normal sensation  8.   MSK  Gait: Slight limp favors the involved digit 9.   Inspection there is a dorsal bunion with decreased extension of the great toe and painful range of motion. Muscle tone normal without atrophy toe is stable dorsal bunion suspected   Imaging x-ray shows arthritis around the great toe  Assessment/Plan Right foot Dorsal bunion and grade 2 arthritis  Recommend right great toe cheilectomy  Arther Abbott 01/23/2014, 12:06 PM

## 2014-01-24 ENCOUNTER — Encounter (HOSPITAL_COMMUNITY): Payer: BC Managed Care – PPO | Admitting: Anesthesiology

## 2014-01-24 ENCOUNTER — Ambulatory Visit (HOSPITAL_COMMUNITY)
Admission: RE | Admit: 2014-01-24 | Discharge: 2014-01-24 | Disposition: A | Discharge status: Home / Self Care | Payer: BC Managed Care – PPO | Source: Ambulatory Visit | Attending: Orthopedic Surgery | Admitting: Orthopedic Surgery

## 2014-01-24 ENCOUNTER — Encounter (HOSPITAL_COMMUNITY)
Admission: RE | Disposition: A | Discharge status: Home / Self Care | Payer: Self-pay | Source: Ambulatory Visit | Attending: Orthopedic Surgery

## 2014-01-24 ENCOUNTER — Encounter (HOSPITAL_COMMUNITY): Payer: Self-pay | Admitting: *Deleted

## 2014-01-24 ENCOUNTER — Ambulatory Visit (HOSPITAL_COMMUNITY): Payer: BC Managed Care – PPO | Admitting: Anesthesiology

## 2014-01-24 DIAGNOSIS — E669 Obesity, unspecified: Secondary | ICD-10-CM | POA: Insufficient documentation

## 2014-01-24 DIAGNOSIS — K219 Gastro-esophageal reflux disease without esophagitis: Secondary | ICD-10-CM | POA: Insufficient documentation

## 2014-01-24 DIAGNOSIS — M898X9 Other specified disorders of bone, unspecified site: Secondary | ICD-10-CM | POA: Insufficient documentation

## 2014-01-24 DIAGNOSIS — E785 Hyperlipidemia, unspecified: Secondary | ICD-10-CM | POA: Insufficient documentation

## 2014-01-24 DIAGNOSIS — Z7982 Long term (current) use of aspirin: Secondary | ICD-10-CM | POA: Insufficient documentation

## 2014-01-24 DIAGNOSIS — M19079 Primary osteoarthritis, unspecified ankle and foot: Secondary | ICD-10-CM

## 2014-01-24 HISTORY — PX: CHEILECTOMY: SHX1336

## 2014-01-24 SURGERY — CHEILECTOMY
Anesthesia: General | Site: Toe | Laterality: Right

## 2014-01-24 MED ORDER — KETOROLAC TROMETHAMINE 30 MG/ML IJ SOLN
30.0000 mg | Freq: Once | INTRAMUSCULAR | Status: AC
  Administered 2014-01-24: 30 mg via INTRAVENOUS

## 2014-01-24 MED ORDER — ONDANSETRON HCL 4 MG/2ML IJ SOLN
INTRAMUSCULAR | Status: AC
  Filled 2014-01-24: qty 2

## 2014-01-24 MED ORDER — ONDANSETRON HCL 4 MG/2ML IJ SOLN
4.0000 mg | Freq: Once | INTRAMUSCULAR | Status: AC
  Administered 2014-01-24: 4 mg via INTRAVENOUS

## 2014-01-24 MED ORDER — LIDOCAINE HCL (CARDIAC) 10 MG/ML IV SOLN
INTRAVENOUS | Status: DC | PRN
  Administered 2014-01-24: 20 mg via INTRAVENOUS

## 2014-01-24 MED ORDER — HYDROCODONE-ACETAMINOPHEN 5-325 MG PO TABS
ORAL_TABLET | ORAL | Status: AC
  Filled 2014-01-24: qty 1

## 2014-01-24 MED ORDER — GLYCOPYRROLATE 0.2 MG/ML IJ SOLN
0.2000 mg | Freq: Once | INTRAMUSCULAR | Status: AC
  Administered 2014-01-24: 0.2 mg via INTRAVENOUS

## 2014-01-24 MED ORDER — GLYCOPYRROLATE 0.2 MG/ML IJ SOLN
INTRAMUSCULAR | Status: AC
  Filled 2014-01-24: qty 1

## 2014-01-24 MED ORDER — LIDOCAINE HCL (PF) 1 % IJ SOLN
INTRAMUSCULAR | Status: AC
  Filled 2014-01-24: qty 5

## 2014-01-24 MED ORDER — HYDROCODONE-ACETAMINOPHEN 10-325 MG PO TABS: 1.0000 | ORAL_TABLET | ORAL | Status: DC | PRN

## 2014-01-24 MED ORDER — PROPOFOL 10 MG/ML IV BOLUS
INTRAVENOUS | Status: AC
  Filled 2014-01-24: qty 20

## 2014-01-24 MED ORDER — CEFAZOLIN SODIUM-DEXTROSE 2-3 GM-% IV SOLR
INTRAVENOUS | Status: AC
  Filled 2014-01-24: qty 50

## 2014-01-24 MED ORDER — BUPIVACAINE HCL (PF) 0.5 % IJ SOLN
INTRAMUSCULAR | Status: DC | PRN
  Administered 2014-01-24: 15 mL

## 2014-01-24 MED ORDER — LACTATED RINGERS IV SOLN
INTRAVENOUS | Status: DC
  Administered 2014-01-24: 08:00:00 via INTRAVENOUS

## 2014-01-24 MED ORDER — FENTANYL CITRATE 0.05 MG/ML IJ SOLN
25.0000 ug | INTRAMUSCULAR | Status: AC
  Administered 2014-01-24 (×2): 25 ug via INTRAVENOUS

## 2014-01-24 MED ORDER — FENTANYL CITRATE 0.05 MG/ML IJ SOLN: 25.0000 ug | INTRAMUSCULAR | Status: DC | PRN

## 2014-01-24 MED ORDER — ONDANSETRON HCL 4 MG/2ML IJ SOLN: 4.0000 mg | Freq: Once | INTRAMUSCULAR | Status: DC | PRN

## 2014-01-24 MED ORDER — SODIUM CHLORIDE 0.9 % IR SOLN
Status: DC | PRN
  Administered 2014-01-24: 1000 mL

## 2014-01-24 MED ORDER — HYDROCODONE-ACETAMINOPHEN 5-325 MG PO TABS
1.0000 | ORAL_TABLET | Freq: Once | ORAL | Status: AC
  Administered 2014-01-24: 1 via ORAL

## 2014-01-24 MED ORDER — MIDAZOLAM HCL 2 MG/2ML IJ SOLN
INTRAMUSCULAR | Status: AC
  Filled 2014-01-24: qty 2

## 2014-01-24 MED ORDER — CHLORHEXIDINE GLUCONATE 4 % EX LIQD: 60.0000 mL | Freq: Once | CUTANEOUS | Status: DC

## 2014-01-24 MED ORDER — FENTANYL CITRATE 0.05 MG/ML IJ SOLN
INTRAMUSCULAR | Status: AC
  Filled 2014-01-24: qty 2

## 2014-01-24 MED ORDER — PROPOFOL 10 MG/ML IV BOLUS
INTRAVENOUS | Status: DC | PRN
  Administered 2014-01-24 (×2): 20 mg via INTRAVENOUS
  Administered 2014-01-24: 180 mg via INTRAVENOUS
  Administered 2014-01-24: 50 mg via INTRAVENOUS
  Administered 2014-01-24: 10 mg via INTRAVENOUS

## 2014-01-24 MED ORDER — MIDAZOLAM HCL 2 MG/2ML IJ SOLN
1.0000 mg | INTRAMUSCULAR | Status: DC | PRN
  Administered 2014-01-24: 2 mg via INTRAVENOUS

## 2014-01-24 MED ORDER — KETOROLAC TROMETHAMINE 30 MG/ML IJ SOLN
INTRAMUSCULAR | Status: AC
  Filled 2014-01-24: qty 1

## 2014-01-24 MED ORDER — FENTANYL CITRATE 0.05 MG/ML IJ SOLN
INTRAMUSCULAR | Status: DC | PRN
  Administered 2014-01-24 (×2): 25 ug via INTRAVENOUS
  Administered 2014-01-24: 50 ug via INTRAVENOUS

## 2014-01-24 MED ORDER — BUPIVACAINE HCL (PF) 0.5 % IJ SOLN
INTRAMUSCULAR | Status: AC
  Filled 2014-01-24: qty 30

## 2014-01-24 MED ORDER — CEFAZOLIN SODIUM-DEXTROSE 2-3 GM-% IV SOLR
2.0000 g | INTRAVENOUS | Status: AC
  Administered 2014-01-24: 2 g via INTRAVENOUS

## 2014-01-24 SURGICAL SUPPLY — 54 items
ADH SKN CLS APL DERMABOND .7 (GAUZE/BANDAGES/DRESSINGS) ×1
BAG HAMPER (MISCELLANEOUS) ×2 IMPLANT
BANDAGE ELASTIC 3 VELCRO ST LF (GAUZE/BANDAGES/DRESSINGS) ×2 IMPLANT
BANDAGE ELASTIC 4 VELCRO NS (GAUZE/BANDAGES/DRESSINGS) ×1 IMPLANT
BANDAGE ESMARK 4X12 BL STRL LF (DISPOSABLE) IMPLANT
BANDAGE GAUZE ELAST BULKY 4 IN (GAUZE/BANDAGES/DRESSINGS) ×1 IMPLANT
BIT DRILL 2.0X128 (BIT) ×1 IMPLANT
BLADE OSC/SAGITTAL MD 9X18.5 (BLADE) ×2 IMPLANT
BLADE SURG 15 STRL LF DISP TIS (BLADE) ×1 IMPLANT
BLADE SURG 15 STRL SS (BLADE) ×2
BNDG CMPR 12X4 ELC STRL LF (DISPOSABLE) ×1
BNDG COHESIVE 4X5 TAN STRL (GAUZE/BANDAGES/DRESSINGS) ×1 IMPLANT
BNDG ESMARK 4X12 BLUE STRL LF (DISPOSABLE) ×2
BNDG GAUZE ELAST 4 BULKY (GAUZE/BANDAGES/DRESSINGS) ×2 IMPLANT
CHLORAPREP W/TINT 26ML (MISCELLANEOUS) ×2 IMPLANT
CLOTH BEACON ORANGE TIMEOUT ST (SAFETY) ×2 IMPLANT
COVER LIGHT HANDLE STERIS (MISCELLANEOUS) ×8 IMPLANT
CUFF TOURNIQUET SINGLE 34IN LL (TOURNIQUET CUFF) ×1 IMPLANT
DECANTER SPIKE VIAL GLASS SM (MISCELLANEOUS) ×2 IMPLANT
DERMABOND ADVANCED (GAUZE/BANDAGES/DRESSINGS) ×1
DERMABOND ADVANCED .7 DNX12 (GAUZE/BANDAGES/DRESSINGS) ×1 IMPLANT
DRSG XEROFORM 1X8 (GAUZE/BANDAGES/DRESSINGS) ×1 IMPLANT
ELECT NDL TIP 2.8 STRL (NEEDLE) ×1 IMPLANT
ELECT NEEDLE TIP 2.8 STRL (NEEDLE) ×2 IMPLANT
ELECT REM PT RETURN 9FT ADLT (ELECTROSURGICAL) ×2
ELECTRODE REM PT RTRN 9FT ADLT (ELECTROSURGICAL) ×1 IMPLANT
FORMALIN 10 PREFIL 120ML (MISCELLANEOUS) ×2 IMPLANT
GLOVE BIOGEL PI IND STRL 7.0 (GLOVE) IMPLANT
GLOVE BIOGEL PI INDICATOR 7.0 (GLOVE) ×2
GLOVE ECLIPSE 7.0 STRL STRAW (GLOVE) ×1 IMPLANT
GLOVE EXAM NITRILE PF LG BLUE (GLOVE) ×2 IMPLANT
GLOVE SKINSENSE NS SZ8.0 LF (GLOVE) ×1
GLOVE SKINSENSE STRL SZ8.0 LF (GLOVE) ×1 IMPLANT
GLOVE SS BIOGEL STRL SZ 6.5 (GLOVE) IMPLANT
GLOVE SS N UNI LF 8.5 STRL (GLOVE) ×2 IMPLANT
GLOVE SUPERSENSE BIOGEL SZ 6.5 (GLOVE) ×1
GOWN STRL REUS W/TWL LRG LVL3 (GOWN DISPOSABLE) ×4 IMPLANT
GOWN STRL REUS W/TWL XL LVL3 (GOWN DISPOSABLE) ×2 IMPLANT
KIT ROOM TURNOVER APOR (KITS) ×2 IMPLANT
MANIFOLD NEPTUNE II (INSTRUMENTS) ×2 IMPLANT
NDL HYPO 21X1.5 SAFETY (NEEDLE) ×1 IMPLANT
NEEDLE HYPO 21X1.5 SAFETY (NEEDLE) ×2 IMPLANT
NS IRRIG 1000ML POUR BTL (IV SOLUTION) ×2 IMPLANT
PACK BASIC LIMB (CUSTOM PROCEDURE TRAY) ×2 IMPLANT
PAD ARMBOARD 7.5X6 YLW CONV (MISCELLANEOUS) ×2 IMPLANT
RASP SM TEAR CROSS CUT (RASP) ×2 IMPLANT
SET BASIN LINEN APH (SET/KITS/TRAYS/PACK) ×2 IMPLANT
SPONGE GAUZE 4X4 12PLY (GAUZE/BANDAGES/DRESSINGS) ×2 IMPLANT
STRIP CLOSURE SKIN 1/2X4 (GAUZE/BANDAGES/DRESSINGS) ×2 IMPLANT
SUT ETHILON 3 0 FSL (SUTURE) ×1 IMPLANT
SUT MON AB 2-0 SH 27 (SUTURE) ×2
SUT MON AB 2-0 SH27 (SUTURE) ×1 IMPLANT
SUT PROLENE 3 0 PS 2 (SUTURE) IMPLANT
SYR CONTROL 10ML LL (SYRINGE) ×2 IMPLANT

## 2014-01-24 NOTE — Transfer of Care (Signed)
Immediate Anesthesia Transfer of Care Note  Patient: Anthony Erickson.  Procedure(s) Performed: Procedure(s) (LRB): CHEILECTOMY (Right)  Patient Location: PACU  Anesthesia Type: General  Level of Consciousness: awake  Airway & Oxygen Therapy: Patient Spontanous Breathing and nasal cannula  Post-op Assessment: Report given to PACU RN, Post -op Vital signs reviewed and stable and Patient moving all extremities  Post vital signs: Reviewed and stable  Complications: No apparent anesthesia complications

## 2014-01-24 NOTE — Anesthesia Postprocedure Evaluation (Signed)
Anesthesia Post Note  Patient: Anthony Erickson.  Procedure(s) Performed: Procedure(s) (LRB): CHEILECTOMY (Right)  Anesthesia type: General  Patient location: PACU  Post pain: Pain level controlled  Post assessment: Post-op Vital signs reviewed, Patient's Cardiovascular Status Stable, Respiratory Function Stable, Patent Airway, No signs of Nausea or vomiting and Pain level controlled  Last Vitals:  Filed Vitals:   01/24/14 0942  BP: 149/78  Pulse: 120  Temp: 37 C  Resp: 15    Post vital signs: Reviewed and stable  Level of consciousness: awake and alert   Complications: No apparent anesthesia complications

## 2014-01-24 NOTE — Op Note (Signed)
Operative report  Date 01/24/2014  Preop diagnosis osteoarthritis right great toe Postop diagnosis same Procedure cheilectomy right great toe and microfracture first metatarsal Operative findings dorsal spur of the right great toe at the distal portion of the first metatarsal and proximal portion of the proximal phalanx with a chondral flap in the middle of the distal portion and articular surface of the great toe. Mild amount of synovitis Surgeon Aline Brochure Assisted by Belenda Cruise Page Tourniquet time 38 minutes pressure 300 mm of mercury Anesthesia Gen.  Details of procedure: Site marking and chart update site confirmation all performed in the preop area as right great toe. The patient was then taken to the surgical suite.  General anesthesia was administered without complication. The patient was in supine position.  After sterile prep and drape, the timeout was completed, appropriate antibiotics were started; the limb was exsanguinated with a 4 to Esmarch and the tourniquet was elevated to 300 mmHg.  A dorsal incision was made centered over the right great toe metatarsophalangeal joint this was taken down through subcutaneous tissue down to the capsule with protection of the extensor hallucis longus tendon. A capsulotomy was performed and the capsule was preserved the synovector me was then performed in the joint was opened a large spur was found in the dorsal portion of the joint, chondral flap was found in the midportion of the joint and a dorsal spur was found in the proximal phalanx. A rongeur and rasp were used to remove the spur from the proximal phalanx and then a oscillating saw was used to remove the spur from the metatarsal. We moved removed approximately 20% of the joint surface. A rasp was used to contour the bone  The joint was irrigated. A drill was used to perform a microfracture of the great toe at the Center of the chondral flap tear, the chondral flap was removed.  The capsule  was closed with 2-0 Monocryl. 2 2-0 Monocryl sutures were placed in the subcutaneous and then interrupted 3-0 nylon sutures were placed in the subcutaneous tissue we injected approximately 15 cc of Marcaine.  Wound dressing was applied  Postop plan is for weightbearing through the heel followup in approximately 5 days.

## 2014-01-24 NOTE — Addendum Note (Signed)
Addendum created 01/24/14 1027 by Vista Deck, CRNA   Modules edited: Anesthesia Flowsheet

## 2014-01-24 NOTE — Discharge Instructions (Signed)
Incision Care °An incision is when a surgeon cuts into your body tissues. After surgery, the incision needs to be cared for properly to prevent infection.  °HOME CARE INSTRUCTIONS  °· Take all medicine as directed by your caregiver. Only take over-the-counter or prescription medicines for pain, discomfort, or fever as directed by your caregiver. °· Do not remove your bandage (dressing) or get your incision wet until your surgeon gives you permission. In the event that your dressing becomes wet, dirty, or starts to smell, change the dressing and call your surgeon for instructions as soon as possible. °· Take showers. Do not take tub baths, swim, or do anything that may soak the wound until it is healed. °· Resume your normal diet and activities as directed or allowed. °· Avoid lifting any weight until you are instructed otherwise. °· Use anti-itch antihistamine medicine as directed by your caregiver. The wound may itch when it is healing. Do not pick or scratch at the wound. °· Follow up with your caregiver for stitch (suture) or staple removal as directed. °· Drink enough fluids to keep your urine clear or pale yellow. °SEEK MEDICAL CARE IF:  °· You have redness, swelling, or increasing pain in the wound that is not controlled with medicine. °· You have drainage, blood, or pus coming from the wound that lasts longer than 1 day. °· You develop muscle aches, chills, or a general ill feeling. °· You notice a bad smell coming from the wound or dressing. °· Your wound edges separate after the sutures, staples, or skin adhesive strips have been removed. °· You develop persistent nausea or vomiting. °SEEK IMMEDIATE MEDICAL CARE IF:  °· You have a fever. °· You develop a rash. °· You develop dizzy episodes or faint while standing. °· You have difficulty breathing. °· You develop any reaction or side effects to medicine given. °MAKE SURE YOU:  °· Understand these instructions. °· Will watch your condition. °· Will get help  right away if you are not doing well or get worse. ° °

## 2014-01-24 NOTE — Anesthesia Preprocedure Evaluation (Signed)
Anesthesia Evaluation  Patient identified by MRN, date of birth, ID band Patient awake    Reviewed: Allergy & Precautions, H&P , NPO status , Patient's Chart, lab work & pertinent test results  Airway Mallampati: III TM Distance: >3 FB Neck ROM: Full    Dental  (+) Teeth Intact   Pulmonary neg pulmonary ROS,  breath sounds clear to auscultation        Cardiovascular negative cardio ROS  Rhythm:Regular Rate:Normal     Neuro/Psych    GI/Hepatic GERD-  Medicated and Controlled,  Endo/Other    Renal/GU      Musculoskeletal   Abdominal   Peds  Hematology   Anesthesia Other Findings   Reproductive/Obstetrics                           Anesthesia Physical Anesthesia Plan  ASA: II  Anesthesia Plan: General   Post-op Pain Management:    Induction: Intravenous  Airway Management Planned: LMA  Additional Equipment:   Intra-op Plan:   Post-operative Plan: Extubation in OR  Informed Consent: I have reviewed the patients History and Physical, chart, labs and discussed the procedure including the risks, benefits and alternatives for the proposed anesthesia with the patient or authorized representative who has indicated his/her understanding and acceptance.     Plan Discussed with:   Anesthesia Plan Comments:         Anesthesia Quick Evaluation

## 2014-01-24 NOTE — Brief Op Note (Signed)
01/24/2014  9:30 AM  PATIENT:  Neena Rhymes.  53 y.o. 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  FINDINGS: DORSAL SPUR METATARSAL AND PROXIMAL PHALANX SYNOVITIS CHONDRAL FLAP MID PORTION DISTAL ARTICULAR SURFACE OF METATARSAL HEAD   PROCEDURE:  Procedure(s): CHEILECTOMY (Right) great toe   SURGEON:  Surgeon(s) and Role:    * Carole Civil, MD - Primary  PHYSICIAN ASSISTANT:   ASSISTANTS: catherine page    ANESTHESIA:   general  EBL:  Total I/O In: 750 [I.V.:750] Out: 0   BLOOD ADMINISTERED:none  DRAINS: none   LOCAL MEDICATIONS USED:  MARCAINE     SPECIMEN:  No Specimen  DISPOSITION OF SPECIMEN:  N/A  COUNTS:  YES  TOURNIQUET:   Total Tourniquet Time Documented: Thigh (Right) - 39 minutes Total: Thigh (Right) - 39 minutes   DICTATION: .Viviann Spare Dictation  PLAN OF CARE: Discharge to home after PACU  PATIENT DISPOSITION:  PACU - hemodynamically stable.   Delay start of Pharmacological VTE agent (>24hrs) due to surgical blood loss or risk of bleeding: not applicable

## 2014-01-24 NOTE — Anesthesia Procedure Notes (Signed)
Procedure Name: LMA Insertion Date/Time: 01/24/2014 8:38 AM Performed by: Vista Deck Pre-anesthesia Checklist: Patient identified, Patient being monitored, Emergency Drugs available, Timeout performed and Suction available Patient Re-evaluated:Patient Re-evaluated prior to inductionOxygen Delivery Method: Circle System Utilized Preoxygenation: Pre-oxygenation with 100% oxygen Intubation Type: IV induction Ventilation: Mask ventilation without difficulty LMA: LMA inserted LMA Size: 4.0 Number of attempts: 1 Placement Confirmation: positive ETCO2 and breath sounds checked- equal and bilateral Tube secured with: Tape Dental Injury: Teeth and Oropharynx as per pre-operative assessment

## 2014-01-24 NOTE — Interval H&P Note (Signed)
History and Physical Interval Note:  01/24/2014 8:21 AM  Anthony Erickson.  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 After consideration of risks, benefits and other options for treatment, the patient has consented to  Procedure(s): CHEILECTOMY (Right) as a surgical intervention .  The patient's history has been reviewed, patient examined, no change in status, stable for surgery.  I have reviewed the patient's chart and labs.  Questions were answered to the patient's satisfaction.     Arther Abbott

## 2014-01-26 ENCOUNTER — Encounter (HOSPITAL_COMMUNITY): Payer: Self-pay | Admitting: Orthopedic Surgery

## 2014-01-29 ENCOUNTER — Encounter: Payer: Self-pay | Admitting: Orthopedic Surgery

## 2014-01-29 ENCOUNTER — Ambulatory Visit (INDEPENDENT_AMBULATORY_CARE_PROVIDER_SITE_OTHER): Payer: BC Managed Care – PPO | Admitting: Orthopedic Surgery

## 2014-01-29 VITALS — BP 133/82 | Ht 67.0 in | Wt 233.0 lb

## 2014-01-29 DIAGNOSIS — M19079 Primary osteoarthritis, unspecified ankle and foot: Secondary | ICD-10-CM

## 2014-01-29 NOTE — Progress Notes (Signed)
Patient ID: Anthony Erickson., male   DOB: 06-Jan-1961, 53 y.o.   MRN: [id number]  Chief Complaint  Patient presents with  . Follow-up    Post op # 1 Right Cheilectomy DOS 01/24/14    BP 133/82  Ht 5\' 7"  (1.702 m)  Wt 233 lb (105.688 kg)  BMI 36.48 kg/m2  Wound check  Dressing change   Suture removal next week   WB thru the heel with post op shoe

## 2014-02-07 ENCOUNTER — Telehealth: Payer: Self-pay | Admitting: *Deleted

## 2014-02-07 NOTE — Telephone Encounter (Signed)
Patient came in and sutures were removed from right great toe. Incision looked good, no redness or drainage noted. Steri strips were applied. Patient advised to keep follow up appointment with Dr. Aline Brochure.

## 2014-02-15 ENCOUNTER — Encounter: Payer: Self-pay | Admitting: Orthopedic Surgery

## 2014-02-15 ENCOUNTER — Ambulatory Visit (INDEPENDENT_AMBULATORY_CARE_PROVIDER_SITE_OTHER): Payer: BC Managed Care – PPO | Admitting: Orthopedic Surgery

## 2014-02-15 VITALS — BP 147/79 | Ht 67.0 in | Wt 233.0 lb

## 2014-02-15 DIAGNOSIS — M19079 Primary osteoarthritis, unspecified ankle and foot: Secondary | ICD-10-CM

## 2014-02-15 NOTE — Progress Notes (Signed)
Patient ID: Anthony Erickson., male   DOB: [birth year], 53 y.o.   MRN: [id number]   Chief Complaint  Patient presents with  . Follow-up    Post op # 2 Right Cheilectomy DOS 01/24/14    Weeks postop right great toe cheilectomy no problems at this point. The wound is clean no signs of infection. He has no swelling. I instructed him on some exercises for the great toe like to do that 3 times a day and follow with me on May 5 to determine if may 6 is an adequate day returned to work

## 2014-02-15 NOTE — Patient Instructions (Signed)
Exercises right great toe 3 x a day

## 2014-02-21 ENCOUNTER — Telehealth: Payer: Self-pay

## 2014-02-22 NOTE — Telephone Encounter (Signed)
Send in pantoprazole 40mg  one daily d/c the other please and let pt know

## 2014-02-22 NOTE — Telephone Encounter (Signed)
Please advise.  Tier 1 on his formulary has lansoprazole, pantoprazole delayed release, and omperzole/ sodium bicarbonate.  Which would you like to change to ?

## 2014-02-23 MED ORDER — PANTOPRAZOLE SODIUM 40 MG PO TBEC
40.0000 mg | DELAYED_RELEASE_TABLET | Freq: Every day | ORAL | Status: DC
Start: 2014-02-23 — End: 2014-02-27

## 2014-02-23 NOTE — Addendum Note (Signed)
Addended by: Denman George B on: 02/23/2014 10:05 AM   Modules accepted: Orders, Medications

## 2014-02-23 NOTE — Telephone Encounter (Signed)
Patient aware and med sent to pharmacy.  

## 2014-02-27 ENCOUNTER — Telehealth: Payer: Self-pay

## 2014-02-27 DIAGNOSIS — K219 Gastro-esophageal reflux disease without esophagitis: Secondary | ICD-10-CM

## 2014-02-27 NOTE — Telephone Encounter (Signed)
pls send nexium 40mg  daily #30 refill 3 , you may need to see what is preferred by his ins

## 2014-02-28 MED ORDER — ESOMEPRAZOLE MAGNESIUM 40 MG PO CPDR: 40.0000 mg | DELAYED_RELEASE_CAPSULE | Freq: Every day | ORAL | Status: DC

## 2014-02-28 NOTE — Telephone Encounter (Signed)
Med sent and patient aware 

## 2014-02-28 NOTE — Addendum Note (Signed)
Addended by: Denman George B on: 02/28/2014 09:34 AM   Modules accepted: Orders

## 2014-03-06 ENCOUNTER — Encounter: Payer: Self-pay | Admitting: Orthopedic Surgery

## 2014-03-06 ENCOUNTER — Ambulatory Visit (INDEPENDENT_AMBULATORY_CARE_PROVIDER_SITE_OTHER): Payer: BC Managed Care – PPO | Admitting: Orthopedic Surgery

## 2014-03-06 VITALS — BP 146/86 | Ht 67.0 in | Wt 233.0 lb

## 2014-03-06 DIAGNOSIS — M19079 Primary osteoarthritis, unspecified ankle and foot: Secondary | ICD-10-CM

## 2014-03-06 NOTE — Patient Instructions (Addendum)
RTW Monday  

## 2014-03-06 NOTE — Progress Notes (Signed)
Patient ID: Anthony Erickson., male   DOB: 11-03-1960, 53 y.o.   MRN: [id number]  Chief Complaint  Patient presents with  . Follow-up    3 week recheck right great toe DOS 01/24/14    6 Weeks postop right great toe cheilectomy no problems at this point. The wound is clean no signs of infection. He has no swelling. I instructed him on some exercises for the great toe like to do that 3 times a day and follow with me on May 5 to determine if may 6 is an adequate day returned to work

## 2014-03-07 ENCOUNTER — Telehealth: Payer: Self-pay | Admitting: Orthopedic Surgery

## 2014-03-07 LAB — LIPID PANEL
Cholesterol: 174 mg/dL (ref 0–200)
HDL: 34 mg/dL — ABNORMAL LOW (ref >39)
LDL Cholesterol: 98 mg/dL (ref 0–99)
Total CHOL/HDL Ratio: 5.1 Ratio
Triglycerides: 211 mg/dL — ABNORMAL HIGH (ref <150)
VLDL: 42 mg/dL — ABNORMAL HIGH (ref 0–40)

## 2014-03-07 LAB — COMPREHENSIVE METABOLIC PANEL
ALT: 47 U/L (ref 0–53)
AST: 29 U/L (ref 0–37)
Albumin: 4.2 g/dL (ref 3.5–5.2)
Alkaline Phosphatase: 65 U/L (ref 39–117)
BUN: 12 mg/dL (ref 6–23)
CO2: 30 mEq/L (ref 19–32)
Calcium: 9.4 mg/dL (ref 8.4–10.5)
Chloride: 104 mEq/L (ref 96–112)
Creat: 1.14 mg/dL (ref 0.50–1.35)
Glucose, Bld: 104 mg/dL — ABNORMAL HIGH (ref 70–99)
Potassium: 4.4 mEq/L (ref 3.5–5.3)
Sodium: 141 mEq/L (ref 135–145)
Total Bilirubin: 0.6 mg/dL (ref 0.2–1.2)
Total Protein: 7 g/dL (ref 6.0–8.3)

## 2014-03-07 LAB — HEMOGLOBIN A1C
Hgb A1c MFr Bld: 5.6 % (ref <5.7)
Mean Plasma Glucose: 114 mg/dL (ref <117)

## 2014-03-07 NOTE — Telephone Encounter (Signed)
Faxed notes, including work note, post op visits, operative report (signed authorization received) to patient's short-term disability insurer, [employer], attention Everrett Coombe, at fax [phone]; Patient aware.

## 2014-03-08 LAB — VITAMIN D 25 HYDROXY (VIT D DEFICIENCY, FRACTURES): Vit D, 25-Hydroxy: 34 ng/mL (ref 30–89)

## 2014-03-08 LAB — TSH: TSH: 1.662 u[IU]/mL (ref 0.350–4.500)

## 2014-03-09 ENCOUNTER — Other Ambulatory Visit: Payer: Self-pay | Admitting: Urology

## 2014-03-09 ENCOUNTER — Ambulatory Visit (INDEPENDENT_AMBULATORY_CARE_PROVIDER_SITE_OTHER): Payer: BC Managed Care – PPO | Admitting: Urology

## 2014-03-09 DIAGNOSIS — R972 Elevated prostate specific antigen [PSA]: Secondary | ICD-10-CM

## 2014-03-09 DIAGNOSIS — D400 Neoplasm of uncertain behavior of prostate: Secondary | ICD-10-CM

## 2014-03-14 ENCOUNTER — Ambulatory Visit: Payer: BC Managed Care – PPO | Admitting: Family Medicine

## 2014-04-17 ENCOUNTER — Ambulatory Visit: Payer: BC Managed Care – PPO | Admitting: Orthopedic Surgery

## 2014-04-30 ENCOUNTER — Other Ambulatory Visit: Payer: Self-pay | Admitting: Family Medicine

## 2014-05-07 ENCOUNTER — Ambulatory Visit: Payer: BC Managed Care – PPO | Admitting: Orthopedic Surgery

## 2014-05-08 ENCOUNTER — Encounter: Payer: Self-pay | Admitting: Orthopedic Surgery

## 2014-05-11 ENCOUNTER — Ambulatory Visit (HOSPITAL_COMMUNITY)
Admission: RE | Admit: 2014-05-11 | Discharge: 2014-05-11 | Disposition: A | Discharge status: Home / Self Care | Payer: BC Managed Care – PPO | Source: Ambulatory Visit | Attending: Urology | Admitting: Urology

## 2014-05-11 ENCOUNTER — Other Ambulatory Visit: Payer: Self-pay | Admitting: Urology

## 2014-05-11 ENCOUNTER — Encounter (HOSPITAL_COMMUNITY): Payer: Self-pay

## 2014-05-11 VITALS — BP 160/88 | HR 100 | Temp 98.3°F | Resp 18

## 2014-05-11 DIAGNOSIS — D400 Neoplasm of uncertain behavior of prostate: Secondary | ICD-10-CM

## 2014-05-11 DIAGNOSIS — C61 Malignant neoplasm of prostate: Secondary | ICD-10-CM | POA: Insufficient documentation

## 2014-05-11 MED ORDER — CEFTRIAXONE SODIUM 1 G IJ SOLR
1.0000 g | INTRAMUSCULAR | Status: DC
  Administered 2014-05-11: 1 g via INTRAMUSCULAR
  Filled 2014-05-11: qty 10

## 2014-05-11 MED ORDER — LIDOCAINE HCL (PF) 2 % IJ SOLN
10.0000 mL | Freq: Once | INTRAMUSCULAR | Status: AC
  Administered 2014-05-11: 10 mL

## 2014-05-11 MED ORDER — CEFTRIAXONE SODIUM 1 G IJ SOLR
1.0000 g | Freq: Once | INTRAMUSCULAR | Status: AC
  Filled 2014-05-11: qty 10

## 2014-05-11 MED ORDER — LIDOCAINE HCL (PF) 2 % IJ SOLN
INTRAMUSCULAR | Status: AC
  Administered 2014-05-11: 10 mL
  Filled 2014-05-11: qty 10

## 2014-05-11 NOTE — Discharge Instructions (Signed)
Transrectal Ultrasound-Guided Biopsy °A transrectal ultrasound-guided biopsy is a procedure to take samples of tissue from your prostate. Ultrasound images are used to guide the procedure. It is usually done to check the prostate gland for cancer. °BEFORE THE PROCEDURE °· Do not eat or drink after midnight on the night before your procedure. °· Take medicines as your doctor tells you. °· Your doctor may have you stop taking some medicines 5-7 days before the procedure. °· You will be given an enema before your procedure. During an enema, a liquid is put into your butt (rectum) to clear out waste. °· You may have lab tests the day of your procedure. °· Make plans to have someone drive you home. °PROCEDURE °· You will be given medicine to help you relax before the procedure. An IV tube will be put into one of your veins. It will be used to give fluids and medicine. °· You will be given medicine to reduce the risk of infection (antibiotic). °· You will be placed on your side. °· A probe with gel will be put in your butt. This is used to take pictures of your prostate and the area around it. °· A medicine to numb the area is put into your prostate. °· A biopsy needle is then inserted and guided to your prostate. °· Samples of prostate tissue are taken. The needle is removed. °· The samples are sent to a lab to be checked. Results are usually back in 2-3 days. °AFTER THE PROCEDURE °· You will be taken to a room where you will be watched until you are doing okay. °· You may have some pain in the area around your butt. You will be given medicines for this. °· You may be able to go home the same day. Sometimes, an overnight stay in the hospital is needed. °Document Released: 10/07/2009 Document Revised: 10/24/2013 Document Reviewed: 06/07/2013 °[employer]® Patient Information ©2015 [employer]. This information is not intended to replace advice given to you by your health care provider. Make sure you discuss any questions  you have with your health care provider. ° °

## 2014-05-18 ENCOUNTER — Ambulatory Visit: Payer: BC Managed Care – PPO | Admitting: Urology

## 2014-06-01 ENCOUNTER — Encounter: Payer: Self-pay | Admitting: Family Medicine

## 2014-06-01 ENCOUNTER — Ambulatory Visit (INDEPENDENT_AMBULATORY_CARE_PROVIDER_SITE_OTHER): Payer: BC Managed Care – PPO | Admitting: Family Medicine

## 2014-06-01 VITALS — BP 120/82 | HR 91 | Resp 16 | Ht 66.5 in | Wt 227.0 lb

## 2014-06-01 DIAGNOSIS — E785 Hyperlipidemia, unspecified: Secondary | ICD-10-CM

## 2014-06-01 DIAGNOSIS — E669 Obesity, unspecified: Secondary | ICD-10-CM

## 2014-06-01 DIAGNOSIS — K219 Gastro-esophageal reflux disease without esophagitis: Secondary | ICD-10-CM

## 2014-06-01 DIAGNOSIS — R5383 Other fatigue: Secondary | ICD-10-CM

## 2014-06-01 DIAGNOSIS — R972 Elevated prostate specific antigen [PSA]: Secondary | ICD-10-CM

## 2014-06-01 DIAGNOSIS — R7301 Impaired fasting glucose: Secondary | ICD-10-CM

## 2014-06-01 DIAGNOSIS — R5381 Other malaise: Secondary | ICD-10-CM

## 2014-06-01 DIAGNOSIS — G4700 Insomnia, unspecified: Secondary | ICD-10-CM

## 2014-06-01 DIAGNOSIS — Z1211 Encounter for screening for malignant neoplasm of colon: Secondary | ICD-10-CM

## 2014-06-01 LAB — POC HEMOCCULT BLD/STL (OFFICE/1-CARD/DIAGNOSTIC): FECAL OCCULT BLD: NEGATIVE

## 2014-06-01 MED ORDER — RANITIDINE HCL 150 MG PO TABS: 150.0000 mg | ORAL_TABLET | Freq: Two times a day (BID) | ORAL | Status: DC

## 2014-06-01 NOTE — Progress Notes (Signed)
   Subjective:    Patient ID: Anthony Erickson., male    DOB: [birth year], 53 y.o.   MRN: [id number]  HPI The PT is here for follow up and re-evaluation of chronic medical conditions, medication management and review of any available recent lab and radiology data.  Preventive health is updated, specifically  Cancer screening and Immunization.   Recently told that he had "prostate cancer" but not to be too concerned at this time"and he is seeking a 2nd opinion in [place] where he will be seen next week Monday. He does  Have an appointment with his local urologist to review management options also/. He has remained comited to adoption of good health habits, with focus on regular exercise and healthy food choice, and he has lost 6 pounds since last visit.He denies any change in bowel habits , needs rectal exam today Requests medication for reflux, nexium is not affordable, he has taken no medication for over 4 months, denies heartburn or difficulty swallowing, dietary counseling provided and he will get zantac for use if needed , for heartburn , he is happy with this    Review of Systems See HPI Denies recent fever or chills. Denies sinus pressure, nasal congestion, ear pain or sore throat. Denies chest congestion, productive cough or wheezing. Denies chest pains, palpitations and leg swelling Denies abdominal pain, nausea, vomiting,diarrhea or constipation.   Denies dysuria, frequency, hesitancy or incontinence. Denies joint pain, swelling and limitation in mobility. Denies headaches, seizures, numbness, or tingling. Denies depression, Denies skin break down or rash.        Objective:   Physical Exam BP 120/82  Pulse 91  Resp 16  Ht 5' 6.5" (1.689 m)  Wt 227 lb (102.967 kg)  BMI 36.09 kg/m2  SpO2 99% Patient alert and oriented and in no cardiopulmonary distress.  HEENT: No facial asymmetry, EOMI,   oropharynx pink and moist.  Neck supple no JVD, no mass.  Chest: Clear to  auscultation bilaterally.  CVS: S1, S2 no murmurs, no S3.Regular rate.  ABD: Soft non tender. No organomegaly or mass, BS normal Rectal: good sphincter tone, no palpable mass, stool is heme negative  Ext: No edema  MS: Adequate ROM spine, shoulders, hips and knees.  Skin: Intact, no ulcerations or rash noted.  Psych: Good eye contact, normal affect. Memory intact not anxious or depressed appearing.  CNS: CN 2-12 intact, power,  normal throughout.no focal deficits noted.        Assessment & Plan:  HYPERLIPIDEMIA Uncontrolled, dietary counselling done Updated lab needed at/ before next visit.   OBESITY Improved. Pt applauded on succesful weight loss through lifestyle change, and encouraged to continue same. Weight loss goal set for the next several months.   GERD Improved significantly, essentially asymptomatic, still requests zantac for "as needed" use  Insomnia Controlled, no change in medication Sleep hygiene reviewed and written information offered also. Prescription sent for  medication needed.   Elevated PSA Reports July biopsy revealed cancer, will need to obtain info from urology. Pt en route to [facility] [place] in [place]  for "second opinion' with regard to management etc. His local urologist, Dr. Roni Bread, is aware and pt has a visit with Dr Roni Bread next week also

## 2014-06-01 NOTE — Patient Instructions (Addendum)
F/u in 4.5 month, call if you need me before  Rectal exam today shows no hidden blood in the stool  Congrats on weight loss, healthy lifestyle, continue to closely watch your cheese, butter and fatty foods and reduce carbs increase vegetables, green and red etc, blood sugar is still normal but has increased  Sign for OV in June, 2015 from Dr Jeffie Pollock , and biopsy report from 05/2014 from Dr Jeffie Pollock  Fasting lipid, chem 7 and hBA1c in 4.5 month  New for heartburn is zantac, twice daily as needed

## 2014-06-02 NOTE — Assessment & Plan Note (Signed)
Uncontrolled, dietary counselling done Updated lab needed at/ before next visit.

## 2014-06-02 NOTE — Assessment & Plan Note (Signed)
Reports July biopsy revealed cancer, will need to obtain info from urology. Pt en route to [facility] [place] in [place]  for "second opinion' with regard to management etc. His local urologist, Dr. Roni Bread, is aware and pt has a visit with Dr Roni Bread next week also

## 2014-06-02 NOTE — Assessment & Plan Note (Signed)
Controlled, no change in medication Sleep hygiene reviewed and written information offered also. Prescription sent for  medication needed.  

## 2014-06-02 NOTE — Assessment & Plan Note (Signed)
Improved significantly, essentially asymptomatic, still requests zantac for "as needed" use

## 2014-06-02 NOTE — Assessment & Plan Note (Signed)
Improved. Pt applauded on succesful weight loss through lifestyle change, and encouraged to continue same. Weight loss goal set for the next several months.  

## 2014-08-30 LAB — BASIC METABOLIC PANEL
BUN: 16 mg/dL (ref 6–23)
CO2: 27 mEq/L (ref 19–32)
Calcium: 9.2 mg/dL (ref 8.4–10.5)
Chloride: 105 mEq/L (ref 96–112)
Creat: 1.2 mg/dL (ref 0.50–1.35)
Glucose, Bld: 109 mg/dL — ABNORMAL HIGH (ref 70–99)
POTASSIUM: 4.4 meq/L (ref 3.5–5.3)
Sodium: 139 mEq/L (ref 135–145)

## 2014-08-30 LAB — LIPID PANEL
CHOL/HDL RATIO: 4.5 ratio
Cholesterol: 145 mg/dL (ref 0–200)
HDL: 32 mg/dL — ABNORMAL LOW (ref >39)
LDL CALC: 88 mg/dL (ref 0–99)
Triglycerides: 125 mg/dL (ref <150)
VLDL: 25 mg/dL (ref 0–40)

## 2014-08-30 LAB — HEMOGLOBIN A1C
Hgb A1c MFr Bld: 5.4 % (ref <5.7)
MEAN PLASMA GLUCOSE: 108 mg/dL (ref <117)

## 2014-09-06 ENCOUNTER — Ambulatory Visit (INDEPENDENT_AMBULATORY_CARE_PROVIDER_SITE_OTHER): Payer: BC Managed Care – PPO | Admitting: Family Medicine

## 2014-09-06 ENCOUNTER — Ambulatory Visit (INDEPENDENT_AMBULATORY_CARE_PROVIDER_SITE_OTHER): Payer: BC Managed Care – PPO

## 2014-09-06 ENCOUNTER — Encounter (INDEPENDENT_AMBULATORY_CARE_PROVIDER_SITE_OTHER): Payer: Self-pay

## 2014-09-06 ENCOUNTER — Encounter: Payer: Self-pay | Admitting: Family Medicine

## 2014-09-06 VITALS — BP 118/80 | HR 97 | Resp 16 | Ht 67.0 in | Wt 225.4 lb

## 2014-09-06 DIAGNOSIS — Z23 Encounter for immunization: Secondary | ICD-10-CM

## 2014-09-06 DIAGNOSIS — C61 Malignant neoplasm of prostate: Secondary | ICD-10-CM

## 2014-09-06 DIAGNOSIS — Z6835 Body mass index (BMI) 35.0-35.9, adult: Secondary | ICD-10-CM

## 2014-09-06 DIAGNOSIS — G4700 Insomnia, unspecified: Secondary | ICD-10-CM

## 2014-09-06 DIAGNOSIS — E785 Hyperlipidemia, unspecified: Secondary | ICD-10-CM

## 2014-09-06 NOTE — Patient Instructions (Signed)
F/u in April, please call if you ned me before  CONGRATS on excellent labs, weight  Loss and healthy lifestyle, keep it up!  Flu vaccine today   CBC, fasting lipid, chem 7 in April  Pls sign for ov in [place]

## 2014-09-07 NOTE — Assessment & Plan Note (Signed)
Sleep hygiene reviewed and written information offered also. Prescription sent for  medication needed. Controlled, no change in medication  

## 2014-09-07 NOTE — Assessment & Plan Note (Signed)
Improved. Pt applauded on succesful weight loss through lifestyle change, and encouraged to continue same. Weight loss goal set for the next several months.  

## 2014-09-07 NOTE — Assessment & Plan Note (Signed)
Hyperlipidemia:Low fat diet discussed and encouraged.  Improved on no meds , just with lifestyle change, pt applauded on this and encouraged to continue same Updated lab needed at/ before next visit.

## 2014-09-07 NOTE — Assessment & Plan Note (Signed)
Vaccine administered at visit.  

## 2014-09-07 NOTE — Assessment & Plan Note (Signed)
Pt understands and is very happy with treatment regime and care he is  obtaining through the cancer center in [place], has f/u appt in 01/2015,.

## 2014-09-07 NOTE — Progress Notes (Signed)
   Subjective:    Patient ID: Anthony Erickson., male    DOB: 1960/12/22, 53 y.o.   MRN: [id number]  HPI The PT is here for follow up and re-evaluation of chronic medical conditions, medication management and review of any available recent lab and radiology data.  Preventive health is updated, specifically  Cancer screening and Immunization.  Needs flu vaccine Questions or concerns regarding consultations or procedures which the PT has had in the interim are  Addressed.Was treated in [place] for prostate cancer and is doing well, he has started flomax for improvement in urinary stream and is tolerating this well.The PT denies any adverse reactions to current medications since the last visit.  There are no new concerns.  There are no specific complaints  Very commited to healthy lifestyle with excellent results      Review of Systems See HPI Denies recent fever or chills. Denies sinus pressure, nasal congestion, ear pain or sore throat. Denies chest congestion, productive cough or wheezing. Denies chest pains, palpitations and leg swelling Denies abdominal pain, nausea, vomiting,diarrhea or constipation.   Denies dysuria, frequency, hesitancy or incontinence. Denies joint pain, swelling and limitation in mobility. Denies headaches, seizures, numbness, or tingling. Denies depression, anxiety or insomnia. Denies skin break down or rash.         Objective:   Physical Exam  BP 118/80 mmHg  Pulse 97  Resp 16  Ht 5\' 7"  (1.702 m)  Wt 225 lb 6.4 oz (102.241 kg)  BMI 35.29 kg/m2  SpO2 97% Patient alert and oriented and in no cardiopulmonary distress.  HEENT: No facial asymmetry, EOMI,   oropharynx pink and moist.  Neck supple no JVD, no mass.  Chest: Clear to auscultation bilaterally.  CVS: S1, S2 no murmurs, no S3.Regular rate.  ABD: Soft non tender.   Ext: No edema  MS: Adequate ROM spine, shoulders, hips and knees.  Skin: Intact, no ulcerations or rash  noted.  Psych: Good eye contact, normal affect. Memory intact not anxious or depressed appearing.  CNS: CN 2-12 intact, power,  normal throughout.no focal deficits noted.       Assessment & Plan:  Hyperlipidemia LDL goal <100 Hyperlipidemia:Low fat diet discussed and encouraged.  Improved on no meds , just with lifestyle change, pt applauded on this and encouraged to continue same Updated lab needed at/ before next visit.   Severe obesity (BMI 35.0-35.9 with comorbidity) Improved. Pt applauded on succesful weight loss through lifestyle change, and encouraged to continue same. Weight loss goal set for the next several months.   Insomnia Sleep hygiene reviewed and written information offered also. Prescription sent for  medication needed. Controlled, no change in medication    Need for prophylactic vaccination and inoculation against influenza Vaccine administered at visit.   Adenocarcinoma of prostate Pt understands and is very happy with treatment regime and care he is  obtaining through the cancer center in [place], has f/u appt in 01/2015,.

## 2014-09-18 ENCOUNTER — Other Ambulatory Visit: Payer: Self-pay | Admitting: Family Medicine

## 2014-11-22 ENCOUNTER — Encounter: Payer: Self-pay | Admitting: Family Medicine

## 2014-11-22 ENCOUNTER — Ambulatory Visit (INDEPENDENT_AMBULATORY_CARE_PROVIDER_SITE_OTHER): Payer: BC Managed Care – PPO | Admitting: Family Medicine

## 2014-11-22 VITALS — BP 120/74 | HR 87 | Resp 16 | Ht 66.5 in | Wt 232.4 lb

## 2014-11-22 DIAGNOSIS — J302 Other seasonal allergic rhinitis: Secondary | ICD-10-CM | POA: Insufficient documentation

## 2014-11-22 DIAGNOSIS — G4700 Insomnia, unspecified: Secondary | ICD-10-CM

## 2014-11-22 MED ORDER — PROMETHAZINE-DM 6.25-15 MG/5ML PO SYRP: 5.0000 mL | ORAL_SOLUTION | Freq: Every evening | ORAL | Status: DC | PRN

## 2014-11-22 MED ORDER — METHYLPREDNISOLONE ACETATE 80 MG/ML IJ SUSP
80.0000 mg | Freq: Once | INTRAMUSCULAR | Status: AC
  Administered 2014-11-22: 80 mg via INTRAMUSCULAR

## 2014-11-22 MED ORDER — TRIAMCINOLONE ACETONIDE 55 MCG/ACT NA AERO: 2.0000 | INHALATION_SPRAY | Freq: Every day | NASAL | Status: DC

## 2014-11-22 MED ORDER — PREDNISONE 5 MG PO TABS: 5.0000 mg | ORAL_TABLET | Freq: Two times a day (BID) | ORAL | Status: DC

## 2014-11-22 MED ORDER — LORATADINE 10 MG PO TABS: 10.0000 mg | ORAL_TABLET | Freq: Every day | ORAL | Status: DC

## 2014-11-22 NOTE — Patient Instructions (Signed)
F/u as before  You are being treated for uncontrolled allergies , you need to commit to daily allergy tablet an nasal spray as prescribed Prednisone is also prescribed for 5 days only, and also a cough suppressant syrup for bedtime use as neede  Call if symptoms persist or worsen

## 2014-11-22 NOTE — Assessment & Plan Note (Signed)
Uncontrolled Depo medrol 80 mg im followed by prednisone for 5 days, start daily loratidine and nasonex, also phenergan Dm at night as needed Call back if symptoms of infection develop

## 2014-11-22 NOTE — Progress Notes (Signed)
   Subjective:    Patient ID: Anthony Rhymes., male    DOB: 08/27/1961, 54 y.o.   MRN: [id number]  HPI 5 day h/o excess head congestion, clear nasal drainage, with sneezing and watery eyes, symptoms are worse at night. No fever, ahs had some chills, sputum and drainage is clear. Poor sleep due to cough   Review of Systems See HPI  Denies chest pains, palpitations and leg swelling Denies abdominal pain, nausea, vomiting,diarrhea or constipation.   Denies dysuria, frequency, hesitancy or incontinence. Denies joint pain, swelling and limitation in mobility. Denies headaches, seizures, numbness, or tingling. Denies depression, anxiety or insomnia. Denies skin break down or rash.        Objective:   Physical Exam BP 120/74 mmHg  Pulse 87  Resp 16  Ht 5' 6.5" (1.689 m)  Wt 232 lb 6.4 oz (105.416 kg)  BMI 36.95 kg/m2  SpO2 98% Patient alert and oriented and in no cardiopulmonary distress.  HEENT: No facial asymmetry, EOMI,   oropharynx pink and moist.  Neck supple no JVD, no mass. No sinus tenderness, TM clear Chest: Clear to auscultation bilaterally.  CVS: S1, S2 no murmurs, no S3.Regular rate.  ABD: Soft non tender.   Ext: No edema  MS: Adequate ROM spine, shoulders, hips and knees.  Psych: Good eye contact, normal affect. Memory intact not anxious or depressed appearing.  CNS: CN 2-12 intact, power,  normal throughout.no focal deficits noted.        Assessment & Plan:  Seasonal allergies Uncontrolled Depo medrol 80 mg im followed by prednisone for 5 days, start daily loratidine and nasonex, also phenergan Dm at night as needed Call back if symptoms of infection develop   Insomnia Uncontrolled due to nocturnal cough, phenergan dm at bedtime until allergy symptoms are controlled

## 2014-11-22 NOTE — Assessment & Plan Note (Signed)
Uncontrolled due to nocturnal cough, phenergan dm at bedtime until allergy symptoms are controlled

## 2015-01-31 LAB — LIPID PANEL
CHOL/HDL RATIO: 4.5 ratio
Cholesterol: 154 mg/dL (ref 0–200)
HDL: 34 mg/dL — ABNORMAL LOW (ref >=40)
LDL CALC: 89 mg/dL (ref 0–99)
TRIGLYCERIDES: 154 mg/dL — AB (ref <150)
VLDL: 31 mg/dL (ref 0–40)

## 2015-01-31 LAB — CBC
HEMATOCRIT: 46.6 % (ref 39.0–52.0)
Hemoglobin: 15.7 g/dL (ref 13.0–17.0)
MCH: 32.7 pg (ref 26.0–34.0)
MCHC: 33.7 g/dL (ref 30.0–36.0)
MCV: 97.1 fL (ref 78.0–100.0)
Platelets: 235 10*3/uL (ref 150–400)
RBC: 4.8 MIL/uL (ref 4.22–5.81)
RDW: 13.5 % (ref 11.5–15.5)
WBC: 6.6 10*3/uL (ref 4.0–10.5)

## 2015-01-31 LAB — BASIC METABOLIC PANEL
BUN: 17 mg/dL (ref 6–23)
CHLORIDE: 103 meq/L (ref 96–112)
CO2: 26 mEq/L (ref 19–32)
Calcium: 9.3 mg/dL (ref 8.4–10.5)
Creat: 1.1 mg/dL (ref 0.50–1.35)
GLUCOSE: 103 mg/dL — AB (ref 70–99)
POTASSIUM: 4.3 meq/L (ref 3.5–5.3)
SODIUM: 139 meq/L (ref 135–145)

## 2015-02-06 ENCOUNTER — Ambulatory Visit (INDEPENDENT_AMBULATORY_CARE_PROVIDER_SITE_OTHER): Payer: BLUE CROSS/BLUE SHIELD | Admitting: Family Medicine

## 2015-02-06 ENCOUNTER — Encounter: Payer: Self-pay | Admitting: Family Medicine

## 2015-02-06 VITALS — BP 118/82 | HR 97 | Resp 16 | Ht 67.0 in | Wt 227.0 lb

## 2015-02-06 DIAGNOSIS — Z139 Encounter for screening, unspecified: Secondary | ICD-10-CM

## 2015-02-06 DIAGNOSIS — Z6835 Body mass index (BMI) 35.0-35.9, adult: Secondary | ICD-10-CM

## 2015-02-06 DIAGNOSIS — E785 Hyperlipidemia, unspecified: Secondary | ICD-10-CM | POA: Diagnosis not present

## 2015-02-06 DIAGNOSIS — C61 Malignant neoplasm of prostate: Secondary | ICD-10-CM

## 2015-02-06 DIAGNOSIS — J302 Other seasonal allergic rhinitis: Secondary | ICD-10-CM

## 2015-02-06 DIAGNOSIS — K219 Gastro-esophageal reflux disease without esophagitis: Secondary | ICD-10-CM

## 2015-02-06 DIAGNOSIS — G4700 Insomnia, unspecified: Secondary | ICD-10-CM

## 2015-02-06 MED ORDER — TEMAZEPAM 30 MG PO CAPS: 30.0000 mg | ORAL_CAPSULE | Freq: Every evening | ORAL | Status: DC | PRN

## 2015-02-06 MED ORDER — OMEPRAZOLE 20 MG PO CPDR: 20.0000 mg | DELAYED_RELEASE_CAPSULE | Freq: Two times a day (BID) | ORAL | Status: DC

## 2015-02-06 NOTE — Patient Instructions (Signed)
CPE in 5 month, call if you need me before  Please continue healthy lifestyle   Reduce fat in diet as discussed  Fasting, and TSH in 5 month  CALL back if omeprazole is too expensive  Fasting lipid, chem 7 and TSH in 5 month  Look into assistance for Dad's care, if he has medicaid this is covered  Thanks for choosing [facility], we consider it a privelige to serve you.

## 2015-02-07 ENCOUNTER — Telehealth: Payer: Self-pay | Admitting: *Deleted

## 2015-02-07 ENCOUNTER — Other Ambulatory Visit: Payer: Self-pay

## 2015-02-07 MED ORDER — RANITIDINE HCL 150 MG PO TABS: 150.0000 mg | ORAL_TABLET | Freq: Two times a day (BID) | ORAL | Status: DC

## 2015-02-07 NOTE — Telephone Encounter (Signed)
Pt called stating the medication for acid reflux is still to high it is going to cost 60$. Pt is requesting something else. Please advise

## 2015-02-07 NOTE — Telephone Encounter (Signed)
Patient aware and med sent  

## 2015-02-07 NOTE — Telephone Encounter (Signed)
Med still too high. Ranitidine 150 bid is on the $4 list at [facility].

## 2015-02-07 NOTE — Telephone Encounter (Signed)
pls snd ranitidine as documented # 60 refill 4 I will sign, let him know

## 2015-02-11 ENCOUNTER — Ambulatory Visit: Payer: BC Managed Care – PPO | Admitting: Family Medicine

## 2015-03-16 NOTE — Assessment & Plan Note (Signed)
ImprovedPatient re-educated about  the importance of commitment to a  minimum of 150 minutes of exercise per week.  The importance of healthy food choices with portion control discussed. Encouraged to start a food diary, count calories and to consider  joining a support group. Sample diet sheets offered. Goals set by the patient for the next several months.   Weight /BMI 02/06/2015 11/22/2014 09/06/2014  WEIGHT 227 lb 232 lb 6.4 oz 225 lb 6.4 oz  HEIGHT 5\' 7"  5' 6.5" 5\' 7"   BMI 35.55 kg/m2 36.95 kg/m2 35.29 kg/m2    Current exercise per week 200 minutes.

## 2015-03-16 NOTE — Assessment & Plan Note (Signed)
Uses otc medication as needed, no flare at this time

## 2015-03-16 NOTE — Assessment & Plan Note (Signed)
Being treated through cancer center in [place], reports excellent report from visit earlier this year. Aggressively using the "natural approach " to good health , as in habits , at this time

## 2015-03-16 NOTE — Assessment & Plan Note (Signed)
Uncontrolled, mainly due to anxiety And stress Sleep hygiene reviewed and written information offered also. Prescription sent for  medication needed.

## 2015-03-16 NOTE — Progress Notes (Signed)
Subjective:    Patient ID: Anthony Rhymes., male    DOB: 04/18/1961, 54 y.o.   MRN: [id number]  HPI The PT is here for follow up and re-evaluation of chronic medical conditions, medication management and review of any available recent lab and radiology data.  Preventive health is updated, specifically  Cancer screening and Immunization.   Questions or concerns regarding consultations or procedures which the PT has had in the interim are  Addressed.Has been recently to cancer re his prostate cancer and gives good report The PT states he needs med adjustment on  Sleep med as his sleep is still not good, some of which he attributes to excess stress in his life  Also wants more affordable option for management of GERD Working consistently on lifestyle change to improve health with good results    Review of Systems See HPI Denies recent fever or chills. Denies sinus pressure, nasal congestion, ear pain or sore throat. Denies chest congestion, productive cough or wheezing. Denies chest pains, palpitations and leg swelling Denies abdominal pain, nausea, vomiting,diarrhea or constipation.   Denies dysuria, frequency, hesitancy or incontinence. Denies joint pain, swelling and limitation in mobility. Denies headaches, seizures, numbness, or tingling.  Denies skin break down or rash.        Objective:   Physical Exam BP 118/82 mmHg  Pulse 97  Resp 16  Ht 5\' 7"  (1.702 m)  Wt 227 lb (102.967 kg)  BMI 35.55 kg/m2  SpO2 98% Patient alert and oriented and in no cardiopulmonary distress.  HEENT: No facial asymmetry, EOMI,   oropharynx pink and moist.  Neck supple no JVD, no mass.  Chest: Clear to auscultation bilaterally.  CVS: S1, S2 no murmurs, no S3.Regular rate.  ABD: Soft non tender.   Ext: No edema  MS: Adequate ROM spine, shoulders, hips and knees.  Skin: Intact, no ulcerations or rash noted.  Psych: Good eye contact, normal affect. Memory intact not anxious or  depressed appearing.  CNS: CN 2-12 intact, power,  normal throughout.no focal deficits noted.        Assessment & Plan:  GERD Symptoms are minimal most of the time he can relate to specific food triggers, will change to zantac , he will calll back if he has problems with control on this med   Hyperlipidemia LDL goal <100 Deteriorated Hyperlipidemia:Low fat diet discussed and encouraged.   Lipid Panel  Lab Results  Component Value Date   CHOL 154 01/30/2015   HDL 34* 01/30/2015   LDLCALC 89 01/30/2015   TRIG 154* 01/30/2015   CHOLHDL 4.5 01/30/2015      Updated lab needed at/ before next visit.    Severe obesity (BMI 35.0-35.9 with comorbidity) ImprovedPatient re-educated about  the importance of commitment to a  minimum of 150 minutes of exercise per week.  The importance of healthy food choices with portion control discussed. Encouraged to start a food diary, count calories and to consider  joining a support group. Sample diet sheets offered. Goals set by the patient for the next several months.   Weight /BMI 02/06/2015 11/22/2014 09/06/2014  WEIGHT 227 lb 232 lb 6.4 oz 225 lb 6.4 oz  HEIGHT 5\' 7"  5' 6.5" 5\' 7"   BMI 35.55 kg/m2 36.95 kg/m2 35.29 kg/m2    Current exercise per week 200 minutes.    Insomnia Uncontrolled, mainly due to anxiety And stress Sleep hygiene reviewed and written information offered also. Prescription sent for  medication needed.    Seasonal  allergies Uses otc medication as needed, no flare at this time

## 2015-03-16 NOTE — Assessment & Plan Note (Signed)
Deteriorated Hyperlipidemia:Low fat diet discussed and encouraged.   Lipid Panel  Lab Results  Component Value Date   CHOL 154 01/30/2015   HDL 34* 01/30/2015   LDLCALC 89 01/30/2015   TRIG 154* 01/30/2015   CHOLHDL 4.5 01/30/2015      Updated lab needed at/ before next visit.

## 2015-03-16 NOTE — Assessment & Plan Note (Signed)
Symptoms are minimal most of the time he can relate to specific food triggers, will change to zantac , he will calll back if he has problems with control on this med

## 2015-06-26 ENCOUNTER — Encounter: Payer: BLUE CROSS/BLUE SHIELD | Admitting: Family Medicine

## 2015-07-22 ENCOUNTER — Ambulatory Visit: Payer: BLUE CROSS/BLUE SHIELD | Admitting: Family Medicine

## 2015-08-03 ENCOUNTER — Emergency Department (HOSPITAL_COMMUNITY)
Admission: EM | Admit: 2015-08-03 | Discharge: 2015-08-03 | Disposition: A | Discharge status: Home / Self Care | Payer: BLUE CROSS/BLUE SHIELD | Attending: Emergency Medicine | Admitting: Emergency Medicine

## 2015-08-03 ENCOUNTER — Encounter (HOSPITAL_COMMUNITY): Payer: Self-pay | Admitting: Emergency Medicine

## 2015-08-03 ENCOUNTER — Emergency Department (HOSPITAL_COMMUNITY): Payer: BLUE CROSS/BLUE SHIELD

## 2015-08-03 DIAGNOSIS — Z8739 Personal history of other diseases of the musculoskeletal system and connective tissue: Secondary | ICD-10-CM | POA: Insufficient documentation

## 2015-08-03 DIAGNOSIS — Z8546 Personal history of malignant neoplasm of prostate: Secondary | ICD-10-CM | POA: Insufficient documentation

## 2015-08-03 DIAGNOSIS — Z8619 Personal history of other infectious and parasitic diseases: Secondary | ICD-10-CM | POA: Insufficient documentation

## 2015-08-03 DIAGNOSIS — E669 Obesity, unspecified: Secondary | ICD-10-CM | POA: Insufficient documentation

## 2015-08-03 DIAGNOSIS — K219 Gastro-esophageal reflux disease without esophagitis: Secondary | ICD-10-CM | POA: Diagnosis not present

## 2015-08-03 DIAGNOSIS — R0789 Other chest pain: Secondary | ICD-10-CM | POA: Insufficient documentation

## 2015-08-03 DIAGNOSIS — R0602 Shortness of breath: Secondary | ICD-10-CM | POA: Insufficient documentation

## 2015-08-03 DIAGNOSIS — Z79899 Other long term (current) drug therapy: Secondary | ICD-10-CM | POA: Diagnosis not present

## 2015-08-03 DIAGNOSIS — R079 Chest pain, unspecified: Secondary | ICD-10-CM | POA: Diagnosis present

## 2015-08-03 DIAGNOSIS — Z7951 Long term (current) use of inhaled steroids: Secondary | ICD-10-CM | POA: Insufficient documentation

## 2015-08-03 HISTORY — DX: Malignant (primary) neoplasm, unspecified: C80.1

## 2015-08-03 HISTORY — DX: Malignant neoplasm of prostate: C61

## 2015-08-03 LAB — CBC WITH DIFFERENTIAL/PLATELET
Basophils Absolute: 0 10*3/uL (ref 0.0–0.1)
Basophils Relative: 0 %
EOS ABS: 0 10*3/uL (ref 0.0–0.7)
EOS PCT: 0 %
HCT: 41.1 % (ref 39.0–52.0)
HEMOGLOBIN: 14.4 g/dL (ref 13.0–17.0)
LYMPHS ABS: 3 10*3/uL (ref 0.7–4.0)
Lymphocytes Relative: 50 %
MCH: 32.8 pg (ref 26.0–34.0)
MCHC: 35 g/dL (ref 30.0–36.0)
MCV: 93.6 fL (ref 78.0–100.0)
MONOS PCT: 10 %
Monocytes Absolute: 0.6 10*3/uL (ref 0.1–1.0)
NEUTROS PCT: 40 %
Neutro Abs: 2.4 10*3/uL (ref 1.7–7.7)
Platelets: 188 10*3/uL (ref 150–400)
RBC: 4.39 MIL/uL (ref 4.22–5.81)
RDW: 12.7 % (ref 11.5–15.5)
WBC: 6 10*3/uL (ref 4.0–10.5)

## 2015-08-03 LAB — TROPONIN I

## 2015-08-03 LAB — BASIC METABOLIC PANEL
Anion gap: 4 — ABNORMAL LOW (ref 5–15)
BUN: 22 mg/dL — AB (ref 6–20)
CHLORIDE: 108 mmol/L (ref 101–111)
CO2: 28 mmol/L (ref 22–32)
CREATININE: 1.22 mg/dL (ref 0.61–1.24)
Calcium: 8.9 mg/dL (ref 8.9–10.3)
GFR calc Af Amer: >60 mL/min (ref >60)
GFR calc non Af Amer: >60 mL/min (ref >60)
GLUCOSE: 91 mg/dL (ref 65–99)
Potassium: 3.8 mmol/L (ref 3.5–5.1)
SODIUM: 140 mmol/L (ref 135–145)

## 2015-08-03 MED ORDER — CYCLOBENZAPRINE HCL 10 MG PO TABS: 10.0000 mg | ORAL_TABLET | Freq: Three times a day (TID) | ORAL | Status: DC | PRN

## 2015-08-03 MED ORDER — IBUPROFEN 800 MG PO TABS: 800.0000 mg | ORAL_TABLET | Freq: Three times a day (TID) | ORAL | Status: DC

## 2015-08-03 NOTE — ED Provider Notes (Signed)
CSN: [id number]     Arrival date & time 08/03/15  1604 History   First MD Initiated Contact with Patient 08/03/15 1610     Chief Complaint  Patient presents with  . Chest Pain     (Consider location/radiation/quality/duration/timing/severity/associated sxs/prior Treatment) HPI Comments: Patient presents to the ER for evaluation of chest pain. Patient reports that he had sudden onset of sharp pain in the right chest area while lifting heavy objects at work. He initially felt short of breath because of pain took his breath away, but that has resolved. Patient continues to have sharp pain in the right chest when he turns his torso to the right.  Patient is a 54 y.o. male presenting with chest pain.  Chest Pain   Past Medical History  Diagnosis Date  . Condyloma acuminatum   . GERD (gastroesophageal reflux disease)   . Hyperlipidemia   . Obesity   . Tendinitis     Of left hand   . Cancer ([place])   . Prostate cancer [facility])    Past Surgical History  Procedure Laterality Date  . Ctr right hand    . Appendectomy  12/13/2009  . Cholecystectomy  08/15/2010    Dr. Cornelia Copa  . Cheilectomy Right 01/24/2014    Procedure: CHEILECTOMY;  Surgeon: Carole Civil, MD;  Location: [facility];  Service: Orthopedics;  Laterality: Right;   Family History  Problem Relation Age of Onset  . Hypertension Mother   . Diabetes Mother   . Hyperlipidemia Mother   . GER disease Father   . Hypertension Father   . Hyperlipidemia Father   . Diabetes      Family History   . Cancer      Family History   . Diabetes Sister   . Hypertension Sister   . Cancer Sister     ovarian    Social History  Substance Use Topics  . Smoking status: Never Smoker   . Smokeless tobacco: None  . Alcohol Use: Yes     Comment: Occasional     Review of Systems  Cardiovascular: Positive for chest pain.  All other systems reviewed and are negative.     Allergies  Review of patient's allergies indicates no known  allergies.  Home Medications   Prior to Admission medications   Medication Sig Start Date End Date Taking? Authorizing Provider  Multiple Vitamin (MULTI VITAMIN MENS PO) Take by mouth daily. Take one tablet by mouth once a day      Historical Provider, MD  Omega-3 Fatty Acids (FISH OIL) 1000 MG CAPS Take 1,000 mg by mouth daily.     Historical Provider, MD  Pomegranate-Tea-Grape-Bilberry 100-60-5-5 MG CAPS Take 1 capsule by mouth 2 (two) times daily.    Historical Provider, MD  ranitidine (ZANTAC) 150 MG tablet Take 1 tablet (150 mg total) by mouth 2 (two) times daily. 02/07/15   Fayrene Helper, MD  tamsulosin (FLOMAX) 0.4 MG CAPS capsule Take 1 capsule (0.4 mg total) by mouth daily after breakfast. 09/06/14   Fayrene Helper, MD  temazepam (RESTORIL) 30 MG capsule Take 1 capsule (30 mg total) by mouth at bedtime as needed. for sleep 02/06/15   Fayrene Helper, MD  triamcinolone (NASACORT AQ) 55 MCG/ACT AERO nasal inhaler Place 2 sprays into the nose daily. 11/22/14   Fayrene Helper, MD   BP 119/83 mmHg  Pulse 87  Temp(Src) 98.1 F (36.7 C) (Oral)  Resp 19  SpO2 100% Physical Exam  Constitutional: He  is oriented to person, place, and time. He appears well-developed and well-nourished. No distress.  HENT:  Head: Normocephalic and atraumatic.  Right Ear: Hearing normal.  Left Ear: Hearing normal.  Nose: Nose normal.  Mouth/Throat: Oropharynx is clear and moist and mucous membranes are normal.  Eyes: Conjunctivae and EOM are normal. Pupils are equal, round, and reactive to light.  Neck: Normal range of motion. Neck supple.  Cardiovascular: Regular rhythm, S1 normal and S2 normal.  Exam reveals no gallop and no friction rub.   No murmur heard. Pulmonary/Chest: Effort normal and breath sounds normal. No respiratory distress. He exhibits no tenderness.  Pain reproduced with twisting torso to the right  Abdominal: Soft. Normal appearance and bowel sounds are normal. There is no  hepatosplenomegaly. There is no tenderness. There is no rebound, no guarding, no tenderness at McBurney's point and negative Murphy's sign. No hernia.  Musculoskeletal: Normal range of motion.  Neurological: He is alert and oriented to person, place, and time. He has normal strength. No cranial nerve deficit or sensory deficit. Coordination normal. GCS eye subscore is 4. GCS verbal subscore is 5. GCS motor subscore is 6.  Skin: Skin is warm, dry and intact. No rash noted. No cyanosis.  Psychiatric: He has a normal mood and affect. His speech is normal and behavior is normal. Thought content normal.  Nursing note and vitals reviewed.   ED Course  Procedures (including critical care time) Labs Review Labs Reviewed  CBC WITH DIFFERENTIAL/PLATELET  BASIC METABOLIC PANEL  TROPONIN I    Imaging Review No results found. I have personally reviewed and evaluated these images and lab results as part of my medical decision-making.   EKG Interpretation   Date/Time:  Saturday August 03 2015 16:19:50 EDT Ventricular Rate:  83 PR Interval:  154 QRS Duration: 93 QT Interval:  384 QTC Calculation: 451 R Axis:   4 Text Interpretation:  Sinus rhythm Normal ECG Confirmed by Davisha Linthicum  MD,  Chrislynn Mosely [phone]) on 08/03/2015 4:24:41 PM      MDM   Final diagnoses:  None   chest wall pain  Patient presents to the ER for evaluation of pain in the right side of his chest. 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 Pain is reproducible with movements of the torso. Pain is extremely atypical, and no way consistent with cardiac etiology. His workup has been negative. The ER. Patient reassured, treat with rest and analgesia.    Orpah Greek, MD 08/03/15 [phone]

## 2015-08-03 NOTE — ED Notes (Signed)
EKG done

## 2015-08-03 NOTE — ED Notes (Signed)
Pt at work and had sudden SOB with sharp pains to left chest with movement.  Patient does lift heavy boxes at work. Pt states SOB is better.  NAD at this time.  Nondiaphoretic.  Pain to chest only with movement.  No SOB observed. Denies n/v/d

## 2015-08-03 NOTE — Discharge Instructions (Signed)

## 2015-08-20 ENCOUNTER — Ambulatory Visit: Payer: BLUE CROSS/BLUE SHIELD | Admitting: Family Medicine

## 2015-10-21 ENCOUNTER — Telehealth: Payer: Self-pay

## 2015-10-21 DIAGNOSIS — E785 Hyperlipidemia, unspecified: Secondary | ICD-10-CM

## 2015-10-21 NOTE — Telephone Encounter (Signed)
Lab order expired. New one reordered

## 2015-10-23 ENCOUNTER — Ambulatory Visit: Payer: BLUE CROSS/BLUE SHIELD | Admitting: Family Medicine

## 2015-12-02 ENCOUNTER — Other Ambulatory Visit: Payer: Self-pay | Admitting: Family Medicine

## 2015-12-03 LAB — LIPID PANEL
Cholesterol: 152 mg/dL (ref 125–200)
HDL: 38 mg/dL — ABNORMAL LOW (ref >=40)
LDL Cholesterol: 90 mg/dL (ref <130)
Total CHOL/HDL Ratio: 4 Ratio (ref <=5.0)
Triglycerides: 120 mg/dL (ref <150)
VLDL: 24 mg/dL (ref <30)

## 2015-12-03 LAB — BASIC METABOLIC PANEL
BUN: 14 mg/dL (ref 7–25)
CO2: 29 mmol/L (ref 20–31)
Calcium: 9 mg/dL (ref 8.6–10.3)
Chloride: 107 mmol/L (ref 98–110)
Creat: 1.12 mg/dL (ref 0.70–1.33)
Glucose, Bld: 112 mg/dL — ABNORMAL HIGH (ref 65–99)
Potassium: 4.2 mmol/L (ref 3.5–5.3)
Sodium: 138 mmol/L (ref 135–146)

## 2015-12-03 LAB — TSH: TSH: 1.662 u[IU]/mL (ref 0.350–4.500)

## 2015-12-06 ENCOUNTER — Encounter: Payer: Self-pay | Admitting: Family Medicine

## 2015-12-06 ENCOUNTER — Ambulatory Visit (INDEPENDENT_AMBULATORY_CARE_PROVIDER_SITE_OTHER): Payer: BLUE CROSS/BLUE SHIELD | Admitting: Family Medicine

## 2015-12-06 ENCOUNTER — Other Ambulatory Visit: Payer: Self-pay

## 2015-12-06 VITALS — BP 122/78 | HR 86 | Resp 16 | Ht 67.0 in | Wt 222.0 lb

## 2015-12-06 DIAGNOSIS — R7301 Impaired fasting glucose: Secondary | ICD-10-CM

## 2015-12-06 DIAGNOSIS — Z1211 Encounter for screening for malignant neoplasm of colon: Secondary | ICD-10-CM | POA: Diagnosis not present

## 2015-12-06 DIAGNOSIS — Z0000 Encounter for general adult medical examination without abnormal findings: Secondary | ICD-10-CM

## 2015-12-06 LAB — POC HEMOCCULT BLD/STL (OFFICE/1-CARD/DIAGNOSTIC): FECAL OCCULT BLD: NEGATIVE

## 2015-12-06 MED ORDER — RANITIDINE HCL 300 MG PO TABS: 300.0000 mg | ORAL_TABLET | Freq: Every day | ORAL | Status: DC

## 2015-12-06 MED ORDER — TEMAZEPAM 30 MG PO CAPS: 30.0000 mg | ORAL_CAPSULE | Freq: Every evening | ORAL | Status: DC | PRN

## 2015-12-06 NOTE — Progress Notes (Signed)
   Subjective:    Patient ID: Anthony Rhymes., male    DOB: Apr 28, 1961, 55 y.o.   MRN: [id number]  HPI Patient is in for annual physical exam. No other health concerns are expressed or addressed at the visit. Recent labs, if available are reviewed. Immunization is reviewed , and  updated if needed.    Review of Systems See HPI      Objective:   Physical Exam BP 122/78 mmHg  Pulse 86  Resp 16  Ht 5\' 7"  (1.702 m)  Wt 222 lb (100.699 kg)  BMI 34.76 kg/m2  SpO2 97%  Pleasant well nourished male, alert and oriented x 3, in no cardio-pulmonary distress. Afebrile. HEENT No facial trauma or asymetry. Sinuses non tender. EOMI, PERTL, fundoscopic exam is negative for hemorhages or exudates. External ears normal, tympanic membranes clear. Oropharynx moist, no exudate, poor dentition. Neck: supple, no adenopathy,JVD or thyromegaly.No bruits.  Chest: Clear to ascultation bilaterally.No crackles or wheezes. Non tender to palpation  Breast: No asymetry,no masses. No nipple discharge or inversion. No axillary or supraclavicular adenopathy  Cardiovascular system; Heart sounds normal,  S1 and  S2 ,no S3.  No murmur, or thrill. Apical beat not displaced Peripheral pulses normal.  Abdomen: Soft, non tender, no organomegaly or masses. No bruits. Bowel sounds normal. No guarding, tenderness or rebound.  Rectal:  Normal sphincter tone. No hemorrhoids or  masses. guaiac negative stool. Prostate smooth and firm  GU: Deferred, sees urology.  Musculoskeletal exam: Full ROM of spine, hips , shoulders and knees. No deformity ,swelling or crepitus noted. No muscle wasting or atrophy.   Neurologic: Cranial nerves 2 to 12 intact. Power, tone ,sensation and reflexes normal throughout. No disturbance in gait. No tremor.  Skin: Intact, no ulceration, erythema , scaling or rash noted. Pigmentation normal throughout  Psych; Normal mood and affect. Judgement and  concentration normal         Assessment & Plan:  Annual physical exam Annual exam as documented. Counseling done  re healthy lifestyle involving commitment to 150 minutes exercise per week, heart healthy diet, and attaining healthy weight.The importance of adequate sleep also discussed. Regular seat belt use and home safety, is also discussed. Changes in health habits are decided on by the patient with goals and time frames  set for achieving them. Immunization and cancer screening needs are specifically addressed at this visit.

## 2015-12-06 NOTE — Patient Instructions (Addendum)
F/u in 6 month, call if you need me sooner  Please cut bACK on ice cream, blood sugar fasting is too high, otherwise labs are great  Hep c and vit D levels will be added to  Current lab , we will let you knwo if you need to keep taking the weekly vit D  CBC, HBA1C, in 6 month, nonm fasting  Thankful PSA levels look really good  CONTINUE dAIILt exercise for at least 30 mins, great for health  Zantac/ Ranitidine sent to [facility] , this is $4 without uiins coverage, will help reflux/ heartburn  Thanks for choosing [facility], we consider it a privelige to serve you.

## 2015-12-06 NOTE — Assessment & Plan Note (Signed)

## 2015-12-07 LAB — VITAMIN D 25 HYDROXY (VIT D DEFICIENCY, FRACTURES): VIT D 25 HYDROXY: 23 ng/mL — AB (ref 30–100)

## 2015-12-07 LAB — HEPATITIS C ANTIBODY: HCV Ab: NEGATIVE

## 2015-12-12 MED ORDER — VITAMIN D (ERGOCALCIFEROL) 1.25 MG (50000 UNIT) PO CAPS: 50000.0000 [IU] | ORAL_CAPSULE | ORAL | Status: DC

## 2015-12-12 NOTE — Addendum Note (Signed)
Addended by: Eual Fines on: 12/12/2015 08:50 AM   Modules accepted: Orders

## 2015-12-13 ENCOUNTER — Other Ambulatory Visit: Payer: Self-pay | Admitting: Family Medicine

## 2015-12-29 ENCOUNTER — Encounter (HOSPITAL_COMMUNITY): Payer: Self-pay | Admitting: *Deleted

## 2015-12-29 ENCOUNTER — Emergency Department (HOSPITAL_COMMUNITY)
Admission: EM | Admit: 2015-12-29 | Discharge: 2015-12-29 | Disposition: A | Discharge status: Home / Self Care | Payer: BLUE CROSS/BLUE SHIELD | Attending: Emergency Medicine | Admitting: Emergency Medicine

## 2015-12-29 DIAGNOSIS — Z8546 Personal history of malignant neoplasm of prostate: Secondary | ICD-10-CM | POA: Insufficient documentation

## 2015-12-29 DIAGNOSIS — Z8739 Personal history of other diseases of the musculoskeletal system and connective tissue: Secondary | ICD-10-CM | POA: Diagnosis not present

## 2015-12-29 DIAGNOSIS — Z791 Long term (current) use of non-steroidal anti-inflammatories (NSAID): Secondary | ICD-10-CM | POA: Insufficient documentation

## 2015-12-29 DIAGNOSIS — K219 Gastro-esophageal reflux disease without esophagitis: Secondary | ICD-10-CM | POA: Insufficient documentation

## 2015-12-29 DIAGNOSIS — J111 Influenza due to unidentified influenza virus with other respiratory manifestations: Secondary | ICD-10-CM | POA: Insufficient documentation

## 2015-12-29 DIAGNOSIS — Z8619 Personal history of other infectious and parasitic diseases: Secondary | ICD-10-CM | POA: Insufficient documentation

## 2015-12-29 DIAGNOSIS — R6883 Chills (without fever): Secondary | ICD-10-CM | POA: Diagnosis present

## 2015-12-29 DIAGNOSIS — E785 Hyperlipidemia, unspecified: Secondary | ICD-10-CM | POA: Insufficient documentation

## 2015-12-29 DIAGNOSIS — E669 Obesity, unspecified: Secondary | ICD-10-CM | POA: Diagnosis not present

## 2015-12-29 DIAGNOSIS — Z79899 Other long term (current) drug therapy: Secondary | ICD-10-CM | POA: Diagnosis not present

## 2015-12-29 MED ORDER — HYDROCODONE-HOMATROPINE 5-1.5 MG/5ML PO SYRP: 5.0000 mL | ORAL_SOLUTION | Freq: Four times a day (QID) | ORAL | Status: DC | PRN

## 2015-12-29 MED ORDER — LORATADINE-PSEUDOEPHEDRINE ER 5-120 MG PO TB12: 1.0000 | ORAL_TABLET | Freq: Two times a day (BID) | ORAL | Status: DC

## 2015-12-29 NOTE — Discharge Instructions (Signed)
Please wash hands frequently. Increase fluids. Use tylenol or ibuprofen for fever and aching. Use claritin d for nasal and sinus congestion. Use Hycodan for cough and chest congestion. This medication may cause drowsiness,use with caution. Influenza, Adult Influenza (flu) is an infection in the mouth, nose, and throat (respiratory tract) caused by a virus. The flu can make you feel very ill. Influenza spreads easily from person to person (contagious).  HOME CARE   Only take medicines as told by your doctor.  Use a cool mist humidifier to make breathing easier.  Get plenty of rest until your fever goes away. This usually takes 3 to 4 days.  Drink enough fluids to keep your pee (urine) clear or pale yellow.  Cover your mouth and nose when you cough or sneeze.  Wash your hands well to avoid spreading the flu.  Stay home from work or school until your fever has been gone for at least 1 full day.  Get a flu shot every year. GET HELP RIGHT AWAY IF:   You have trouble breathing or feel short of breath.  Your skin or nails turn blue.  You have severe neck pain or stiffness.  You have a severe headache, facial pain, or earache.  Your fever gets worse or keeps coming back.  You feel sick to your stomach (nauseous), throw up (vomit), or have watery poop (diarrhea).  You have chest pain.  You have a deep cough that gets worse, or you cough up more thick spit (mucus). MAKE SURE YOU:   Understand these instructions.  Will watch your condition.  Will get help right away if you are not doing well or get worse.   This information is not intended to replace advice given to you by your health care provider. Make sure you discuss any questions you have with your health care provider.   Document Released: 07/28/2008 Document Revised: 11/09/2014 Document Reviewed: 01/18/2012 Elsevier Interactive Patient Education [employer].

## 2015-12-29 NOTE — ED Notes (Signed)
Pt c/o chills/fever/congestion/np cough x 3 days. Some nausea. Denies v/d. nad noted.

## 2015-12-29 NOTE — ED Notes (Signed)
Pt states chills, fever (2 days ago), non-productive cough and body aches. Symptoms x 3 days.

## 2015-12-29 NOTE — ED Provider Notes (Signed)
CSN: [id number]     Arrival date & time 12/29/15  [id number] History   First MD Initiated Contact with Patient 12/29/15 [phone]     Chief Complaint  Patient presents with  . Chills     (Consider location/radiation/quality/duration/timing/severity/associated sxs/prior Treatment) HPI Comments: Pt reports high fever and body aches 2 days ago.  Patient is a 55 y.o. male presenting with URI. The history is provided by the patient.  URI Presenting symptoms: congestion, cough and fever   Severity:  Moderate Onset quality:  Gradual Duration:  3 days Timing:  Intermittent Progression:  Worsening Chronicity:  New Relieved by:  Nothing Worsened by:  Nothing tried Ineffective treatments:  OTC medications Associated symptoms: headaches and myalgias   Risk factors: no diabetes mellitus, no immunosuppression and no recent travel     Past Medical History  Diagnosis Date  . Condyloma acuminatum   . GERD (gastroesophageal reflux disease)   . Hyperlipidemia   . Obesity   . Tendinitis     Of left hand   . Cancer ([place])   . Prostate cancer [facility])    Past Surgical History  Procedure Laterality Date  . Ctr right hand    . Appendectomy  12/13/2009  . Cholecystectomy  08/15/2010    Dr. Cornelia Copa  . Cheilectomy Right 01/24/2014    Procedure: CHEILECTOMY;  Surgeon: Carole Civil, MD;  Location: [facility];  Service: Orthopedics;  Laterality: Right;   Family History  Problem Relation Age of Onset  . Hypertension Mother   . Diabetes Mother   . Hyperlipidemia Mother   . GER disease Father   . Hypertension Father   . Hyperlipidemia Father   . Diabetes      Family History   . Cancer      Family History   . Diabetes Sister   . Hypertension Sister   . Cancer Sister     ovarian    Social History  Substance Use Topics  . Smoking status: Never Smoker   . Smokeless tobacco: None  . Alcohol Use: Yes     Comment: Occasional     Review of Systems  Constitutional: Positive for fever and chills.   HENT: Positive for congestion.   Respiratory: Positive for cough.   Musculoskeletal: Positive for myalgias.  Neurological: Positive for headaches.  All other systems reviewed and are negative.     Allergies  Review of patient's allergies indicates no known allergies.  Home Medications   Prior to Admission medications   Medication Sig Start Date End Date Taking? Authorizing Provider  cyclobenzaprine (FLEXERIL) 10 MG tablet Take 1 tablet (10 mg total) by mouth 3 (three) times daily as needed for muscle spasms. 08/03/15   Orpah Greek, MD  ibuprofen (ADVIL,MOTRIN) 800 MG tablet Take 1 tablet (800 mg total) by mouth 3 (three) times daily. 08/03/15   Orpah Greek, MD  Multiple Vitamin (MULTI VITAMIN MENS PO) Take 1 tablet by mouth daily. Take one tablet by mouth once a day     Historical Provider, MD  Omega-3 Fatty Acids (FISH OIL) 1000 MG CAPS Take 1,000 mg by mouth daily.     Historical Provider, MD  ranitidine (ZANTAC) 300 MG tablet Take 1 tablet (300 mg total) by mouth at bedtime. 12/06/15   Fayrene Helper, MD  temazepam (RESTORIL) 30 MG capsule TAKE ONE CAPSULE BY MOUTH ONCE DAILY AT BEDTIME AS NEEDED FOR  SLEEP 12/16/15   Fayrene Helper, MD  Vitamin D, Ergocalciferol, (DRISDOL) 50000 UNITS  CAPS capsule Take 50,000 Units by mouth every 7 (seven) days. Takes on Monday    Historical Provider, MD  Vitamin D, Ergocalciferol, (DRISDOL) 50000 units CAPS capsule Take 1 capsule (50,000 Units total) by mouth every 7 (seven) days. 12/12/15   Fayrene Helper, MD   BP 128/89 mmHg  Pulse 92  Temp(Src) 98.4 F (36.9 C) (Oral)  Resp 18  Ht 5\' 6"  (1.676 m)  Wt 99.338 kg  BMI 35.36 kg/m2  SpO2 100% Physical Exam  Constitutional: He is oriented to person, place, and time. He appears well-developed and well-nourished.  Non-toxic appearance.  HENT:  Head: Normocephalic.  Right Ear: Tympanic membrane and external ear normal.  Left Ear: Tympanic membrane and external ear  normal.  Nasal congestion.  Eyes: EOM and lids are normal. Pupils are equal, round, and reactive to light.  Neck: Normal range of motion. Neck supple. Carotid bruit is not present.  Cardiovascular: Normal rate, regular rhythm, normal heart sounds, intact distal pulses and normal pulses.   Pulmonary/Chest: Breath sounds normal. No respiratory distress.  Pt speaks in complete sentences.  Abdominal: Soft. Bowel sounds are normal. There is no tenderness. There is no guarding.  Musculoskeletal: Normal range of motion.  Lymphadenopathy:       Head (right side): No submandibular adenopathy present.       Head (left side): No submandibular adenopathy present.    He has no cervical adenopathy.  Neurological: He is alert and oriented to person, place, and time. He has normal strength. No cranial nerve deficit or sensory deficit.  Skin: Skin is warm and dry. No rash noted.  Psychiatric: He has a normal mood and affect. His speech is normal.  Nursing note and vitals reviewed.   ED Course  Procedures (including critical care time) Labs Review Labs Reviewed - No data to display  Imaging Review No results found. I have personally reviewed and evaluated these images and lab results as part of my medical decision-making.   EKG Interpretation None      MDM Exam favors influenza. Vitals reviewed. Pt will be treated with claritin D and hycodan. Discussed increase in fluids and good hand washing. Pt in agreement with plan.   Final diagnoses:  Influenza    *I have reviewed nursing notes, vital signs, and all appropriate lab and imaging results for this patient.**    Lily Kocher, PA-C 12/29/15 0845  Lily Kocher, PA-C 12/29/15 [id number]  Nat Christen, MD 12/29/15 [phone]

## 2016-05-15 ENCOUNTER — Encounter (HOSPITAL_COMMUNITY): Payer: Self-pay

## 2016-05-15 ENCOUNTER — Emergency Department (HOSPITAL_COMMUNITY): Payer: BLUE CROSS/BLUE SHIELD

## 2016-05-15 ENCOUNTER — Emergency Department (HOSPITAL_COMMUNITY)
Admission: EM | Admit: 2016-05-15 | Discharge: 2016-05-15 | Disposition: A | Discharge status: Home / Self Care | Payer: BLUE CROSS/BLUE SHIELD | Attending: Emergency Medicine | Admitting: Emergency Medicine

## 2016-05-15 DIAGNOSIS — M79601 Pain in right arm: Secondary | ICD-10-CM | POA: Diagnosis present

## 2016-05-15 DIAGNOSIS — Z8546 Personal history of malignant neoplasm of prostate: Secondary | ICD-10-CM | POA: Diagnosis not present

## 2016-05-15 DIAGNOSIS — Z79899 Other long term (current) drug therapy: Secondary | ICD-10-CM | POA: Diagnosis not present

## 2016-05-15 DIAGNOSIS — M4722 Other spondylosis with radiculopathy, cervical region: Secondary | ICD-10-CM

## 2016-05-15 DIAGNOSIS — M5030 Other cervical disc degeneration, unspecified cervical region: Secondary | ICD-10-CM | POA: Diagnosis not present

## 2016-05-15 DIAGNOSIS — Z6834 Body mass index (BMI) 34.0-34.9, adult: Secondary | ICD-10-CM | POA: Diagnosis not present

## 2016-05-15 DIAGNOSIS — E669 Obesity, unspecified: Secondary | ICD-10-CM | POA: Insufficient documentation

## 2016-05-15 DIAGNOSIS — M19011 Primary osteoarthritis, right shoulder: Secondary | ICD-10-CM | POA: Diagnosis not present

## 2016-05-15 DIAGNOSIS — E785 Hyperlipidemia, unspecified: Secondary | ICD-10-CM | POA: Insufficient documentation

## 2016-05-15 MED ORDER — PREDNISONE 10 MG PO TABS: ORAL_TABLET | ORAL | Status: DC

## 2016-05-15 MED ORDER — HYDROCODONE-ACETAMINOPHEN 5-325 MG PO TABS: 1.0000 | ORAL_TABLET | ORAL | Status: DC | PRN

## 2016-05-15 NOTE — Discharge Instructions (Signed)
Osteoarthritis Osteoarthritis is a disease that causes soreness and inflammation of a joint. It occurs when the cartilage at the affected joint wears down. Cartilage acts as a cushion, covering the ends of bones where they meet to form a joint. Osteoarthritis is the most common form of arthritis. It often occurs in older people. The joints affected most often by this condition include those in the:  Ends of the fingers.  Thumbs.  Neck.  Lower back.  Knees.  Hips. CAUSES  Over time, the cartilage that covers the ends of bones begins to wear away. This causes bone to rub on bone, producing pain and stiffness in the affected joints.  RISK FACTORS Certain factors can increase your chances of having osteoarthritis, including:  Older age.  Excessive body weight.  Overuse of joints.  Previous joint injury. SIGNS AND SYMPTOMS   Pain, swelling, and stiffness in the joint.  Over time, the joint may lose its normal shape.  Small deposits of bone (osteophytes) may grow on the edges of the joint.  Bits of bone or cartilage can break off and float inside the joint space. This may cause more pain and damage. DIAGNOSIS  Your health care provider will do a physical exam and ask about your symptoms. Various tests may be ordered, such as:  X-rays of the affected joint.  Blood tests to rule out other types of arthritis. Additional tests may be used to diagnose your condition. TREATMENT  Goals of treatment are to control pain and improve joint function. Treatment plans may include:  A prescribed exercise program that allows for rest and joint relief.  A weight control plan.  Pain relief techniques, such as:  Properly applied heat and cold.  Electric pulses delivered to nerve endings under the skin (transcutaneous electrical nerve stimulation [TENS]).  Massage.  Certain nutritional supplements.  Medicines to control pain, such as:  Acetaminophen.  Nonsteroidal  anti-inflammatory drugs (NSAIDs), such as naproxen.  Narcotic or central-acting agents, such as tramadol.  Corticosteroids. These can be given orally or as an injection.  Surgery to reposition the bones and relieve pain (osteotomy) or to remove loose pieces of bone and cartilage. Joint replacement may be needed in advanced states of osteoarthritis. HOME CARE INSTRUCTIONS   Take medicines only as directed by your health care provider.  Maintain a healthy weight. Follow your health care provider's instructions for weight control. This may include dietary instructions.  Exercise as directed. Your health care provider can recommend specific types of exercise. These may include:  Strengthening exercises. These are done to strengthen the muscles that support joints affected by arthritis. They can be performed with weights or with exercise bands to add resistance.  Aerobic activities. These are exercises, such as brisk walking or low-impact aerobics, that get your heart pumping.  Range-of-motion activities. These keep your joints limber.  Balance and agility exercises. These help you maintain daily living skills.  Rest your affected joints as directed by your health care provider.  Keep all follow-up visits as directed by your health care provider. SEEK MEDICAL CARE IF:   Your skin turns red.  You develop a rash in addition to your joint pain.  You have worsening joint pain.  You have a fever along with joint or muscle aches. SEEK IMMEDIATE MEDICAL CARE IF:  You have a significant loss of weight or appetite.  You have night sweats. [place] of Arthritis and Musculoskeletal and Skin Diseases: www.niams.SouthExposed.es  National Institute on  Aging: http://kim-Stein.com/  American College of Rheumatology: www.rheumatology.org   This information is not intended to replace advice given to you by your health care provider. Make sure you discuss any questions you  have with your health care provider.   Document Released: 10/19/2005 Document Revised: 11/09/2014 Document Reviewed: 06/26/2013 Elsevier Interactive Patient Education 2016 Elsevier Inc.  Radicular Pain Radicular pain in either the arm or leg is usually from a bulging or herniated disk in the spine. A piece of the herniated disk may press against the nerves as the nerves exit the spine. This causes pain which is felt at the tips of the nerves down the arm or leg. Other causes of radicular pain may include:  Fractures.  Heart disease.  Cancer.  An abnormal and usually degenerative state of the nervous system or nerves (neuropathy). Diagnosis may require CT or MRI scanning to determine the primary cause.  Nerves that start at the neck (nerve roots) may cause radicular pain in the outer shoulder and arm. It can spread down to the thumb and fingers. The symptoms vary depending on which nerve root has been affected. In most cases radicular pain improves with conservative treatment. Neck problems may require physical therapy, a neck collar, or cervical traction. Treatment may take many weeks, and surgery may be considered if the symptoms do not improve.  Conservative treatment is also recommended for sciatica. Sciatica causes pain to radiate from the lower back or buttock area down the leg into the foot. Often there is a history of back problems. Most patients with sciatica are better after 2 to 4 weeks of rest and other supportive care. Short term bed rest can reduce the disk pressure considerably. Sitting, however, is not a good position since this increases the pressure on the disk. You should avoid bending, lifting, and all other activities which make the problem worse. Traction can be used in severe cases. Surgery is usually reserved for patients who do not improve within the first months of treatment. Only take over-the-counter or prescription medicines for pain, discomfort, or fever as directed by  your caregiver. Narcotics and muscle relaxants may help by relieving more severe pain and spasm and by providing mild sedation. Cold or massage can give significant relief. Spinal manipulation is not recommended. It can increase the degree of disc protrusion. Epidural steroid injections are often effective treatment for radicular pain. These injections deliver medicine to the spinal nerve in the space between the protective covering of the spinal cord and back bones (vertebrae). Your caregiver can give you more information about steroid injections. These injections are most effective when given within two weeks of the onset of pain.  You should see your caregiver for follow up care as recommended. A program for neck and back injury rehabilitation with stretching and strengthening exercises is an important part of management.  SEEK IMMEDIATE MEDICAL CARE IF:  You develop increased pain, weakness, or numbness in your arm or leg.  You develop difficulty with bladder or bowel control.  You develop abdominal pain.   This information is not intended to replace advice given to you by your health care provider. Make sure you discuss any questions you have with your health care provider.   Document Released: 11/26/2004 Document Revised: 11/09/2014 Document Reviewed: 05/15/2015 Elsevier Interactive Patient Education [employer].

## 2016-05-15 NOTE — ED Notes (Signed)
EDP at bedside updating patient. 

## 2016-05-15 NOTE — ED Provider Notes (Signed)
CSN: [id number]     Arrival date & time 05/15/16  1015 History   First MD Initiated Contact with Patient 05/15/16 1036     Chief Complaint  Patient presents with  . Arm Pain  . Back Pain     (Consider location/radiation/quality/duration/timing/severity/associated sxs/prior Treatment) The history is provided by the patient.   Anthony Erickson. is a 55 y.o. male with past medical history as outlined below presenting with a one month history of worsening right arm pain.  He describes pain from his upper shoulder area radiating into his right mid forearm which is worsened with movement and severe if he tries to sleep on the right side.  He also endorses chronic neck pain and has a known history of lumbar disk problems.  He denies weakness or numbness in his arms or hands and denies any new or remote injuries to shoulder, arm or neck.  He has found no alleviators for his symptoms.  He is right handed. He denies chest pain, shortness of breath, nausea or emesis with these symptoms.     Past Medical History  Diagnosis Date  . Condyloma acuminatum   . GERD (gastroesophageal reflux disease)   . Hyperlipidemia   . Obesity   . Tendinitis     Of left hand   . Cancer ([place])   . Prostate cancer [facility])    Past Surgical History  Procedure Laterality Date  . Ctr right hand    . Appendectomy  12/13/2009  . Cholecystectomy  08/15/2010    Dr. Cornelia Copa  . Cheilectomy Right 01/24/2014    Procedure: CHEILECTOMY;  Surgeon: Carole Civil, MD;  Location: [facility];  Service: Orthopedics;  Laterality: Right;   Family History  Problem Relation Age of Onset  . Hypertension Mother   . Diabetes Mother   . Hyperlipidemia Mother   . GER disease Father   . Hypertension Father   . Hyperlipidemia Father   . Diabetes      Family History   . Cancer      Family History   . Diabetes Sister   . Hypertension Sister   . Cancer Sister     ovarian    Social History  Substance Use Topics  . Smoking status:  Never Smoker   . Smokeless tobacco: None  . Alcohol Use: No     Comment: Occasional     Review of Systems  Constitutional: Negative for fever.  Musculoskeletal: Positive for arthralgias. Negative for myalgias and joint swelling.  Neurological: Negative for weakness and numbness.      Allergies  Review of patient's allergies indicates no known allergies.  Home Medications   Prior to Admission medications   Medication Sig Start Date End Date Taking? Authorizing Provider  Nyoka Cowden Tea, Camillia sinensis, (GREEN TEA EXTRACT PO) Take 1 capsule by mouth daily.   Yes Historical Provider, MD  Multiple Vitamin (MULTI VITAMIN MENS PO) Take 1 tablet by mouth daily.    Yes Historical Provider, MD  Omega-3 Fatty Acids (FISH OIL) 1000 MG CAPS Take 1,000 mg by mouth daily.    Yes Historical Provider, MD  Pomegranate, Punica granatum, (POMEGRANATE PO) Take 1 capsule by mouth daily.   Yes Historical Provider, MD  HYDROcodone-acetaminophen (NORCO/VICODIN) 5-325 MG tablet Take 1 tablet by mouth every 4 (four) hours as needed. 05/15/16   Evalee Jefferson, PA-C  predniSONE (DELTASONE) 10 MG tablet 6, 5, 4, 3, 2 then 1 tablet by mouth daily for 6 days total. 05/15/16  Evalee Jefferson, PA-C   BP 126/88 mmHg  Pulse 85  Temp(Src) 97.8 F (36.6 C) (Oral)  Resp 14  Ht 5\' 6"  (1.676 m)  Wt 97.977 kg  BMI 34.88 kg/m2  SpO2 100% Physical Exam  Constitutional: He appears well-developed and well-nourished.  HENT:  Head: Atraumatic.  Neck: Normal range of motion.  Cardiovascular:  Pulses:      Radial pulses are 2+ on the right side, and 2+ on the left side.  Pulses equal bilaterally  Musculoskeletal: He exhibits no tenderness.       Cervical back: He exhibits bony tenderness. He exhibits no swelling, no edema, no deformity and no spasm.       Lumbar back: He exhibits bony tenderness. He exhibits no swelling, no edema, no deformity and no spasm.  No edema or joint effusions appreciated.  Patient has full range of  motion of shoulder elbow and wrist of right upper extremity.  Neurological: He is alert. He has normal strength. He displays normal reflexes. No sensory deficit. Gait normal.  Equal grip strength  Skin: Skin is warm and dry.  Psychiatric: He has a normal mood and affect.    ED Course  Procedures (including critical care time) Labs Review Labs Reviewed - No data to display  Imaging Review Dg Cervical Spine Complete  05/15/2016  CLINICAL DATA:  Right neck pain, right shoulder pain EXAM: CERVICAL SPINE - COMPLETE 4+ VIEW COMPARISON:  None S FINDINGS: Six views of the cervical spine submitted. No acute fracture or subluxation. Mild degenerative changes C1-C2 articulation. There is moderate anterior spurring lower endplate of C3, C4 and C5 vertebral body. Mild disc space flattening at C4-C5. Moderate disc space flattening at C5-C6 level. Mild disc space flattening with anterior spurring at C6-C7 level. No prevertebral soft tissue swelling. Cervical airway is patent. IMPRESSION: No acute fracture or subluxation. Multilevel degenerative changes as described above. Electronically Signed   By: Lahoma Crocker M.D.   On: 05/15/2016 11:57   Dg Lumbar Spine Complete  05/15/2016  CLINICAL DATA:  Lumbago, chronic and progressing EXAM: LUMBAR SPINE - COMPLETE 4+ VIEW COMPARISON:  None. FINDINGS: Frontal, lateral, spot lumbosacral lateral common bile oblique views were obtained. There are 5 non-rib-bearing lumbar type vertebral bodies. There is no fracture. There is 4 mm of anterolisthesis of L4 on L5. No other spondylolisthesis is evident. There is moderate disc space narrowing in the visualized lower thoracic regions as well as at L1-2, L2-3, and L4-5. There are anterior osteophytes at L1, L2, and L4. There is calcification in the anterior ligament at several levels. There is facet osteoarthritic change to varying degrees at all levels, most notably at L4-5 and L5-S1 bilaterally. No blastic or lytic bone lesions are  evident. There are seed implants in the prostate. IMPRESSION: Multilevel osteoarthritic change. Slight spondylolisthesis at L4-5, likely due to underlying spondylosis. No fracture evident. No neoplastic focus appreciable. There are seed implants in the prostate. Electronically Signed   By: Lowella Grip III M.D.   On: 05/15/2016 11:58   Dg Shoulder Right  05/15/2016  CLINICAL DATA:  Right side shoulder pain and neck pain neck goes down right arm EXAM: RIGHT SHOULDER - 2+ VIEW COMPARISON:  05/20/2008 FINDINGS: Degenerative changes in the right [facility] joint and glenohumeral joint. No acute bony abnormality. Specifically, no fracture, subluxation, or dislocation. Soft tissues are intact. IMPRESSION: No acute bony abnormality. Electronically Signed   By: Rolm Baptise M.D.   On: 05/15/2016 12:03   I have personally  reviewed and evaluated these images and lab results as part of my medical decision-making.   EKG Interpretation None      MDM   Final diagnoses:  Cervical radiculopathy due to degenerative joint disease of spine  Osteoarthritis of right shoulder, unspecified osteoarthritis type      Radiological studies were viewed, interpreted and considered during the medical decision making and disposition process. I agree with radiologists reading.  Results were also discussed with patient.   Patient was advised close follow-up with his PCP, he may benefit from a MRI could better define his apparent degenerative disc disease based on his plain films.  I suspect he has radiculopathy secondary to nerve impingement.  He was placed on a prednisone taper and also prescribed hydrocodone for pain relief in anticipation of follow-up with his primary doctor.  Advised heat therapy several times daily.  Activity as tolerated.  Discussed signs and symptoms of worsening condition including weakness in his extremities.   Evalee Jefferson, PA-C 05/16/16 [place], DO 05/17/16 [phone]

## 2016-05-15 NOTE — ED Notes (Signed)
Pt reports sharp pain in r arm x 1 month.  Reports hurts to move arm and to lay on r side.  Denies injury.   ALso c/o pain in lower back off and on but worse in the morning.  Denies injury.

## 2016-05-18 ENCOUNTER — Encounter: Payer: Self-pay | Admitting: Family Medicine

## 2016-05-18 ENCOUNTER — Ambulatory Visit (INDEPENDENT_AMBULATORY_CARE_PROVIDER_SITE_OTHER): Payer: BLUE CROSS/BLUE SHIELD | Admitting: Family Medicine

## 2016-05-18 VITALS — BP 122/84 | HR 96 | Resp 16 | Ht 67.0 in | Wt 229.0 lb

## 2016-05-18 DIAGNOSIS — Z0289 Encounter for other administrative examinations: Secondary | ICD-10-CM

## 2016-05-18 DIAGNOSIS — M542 Cervicalgia: Secondary | ICD-10-CM | POA: Insufficient documentation

## 2016-05-18 DIAGNOSIS — M79604 Pain in right leg: Secondary | ICD-10-CM | POA: Insufficient documentation

## 2016-05-18 DIAGNOSIS — M545 Low back pain: Secondary | ICD-10-CM | POA: Diagnosis not present

## 2016-05-18 DIAGNOSIS — M79605 Pain in left leg: Secondary | ICD-10-CM | POA: Insufficient documentation

## 2016-05-18 MED ORDER — RANITIDINE HCL 150 MG PO CAPS: 150.0000 mg | ORAL_CAPSULE | Freq: Two times a day (BID) | ORAL | Status: DC

## 2016-05-18 MED ORDER — GABAPENTIN 300 MG PO CAPS: 300.0000 mg | ORAL_CAPSULE | Freq: Every day | ORAL | Status: DC

## 2016-05-18 MED ORDER — IBUPROFEN 800 MG PO TABS: ORAL_TABLET | ORAL | Status: AC

## 2016-05-18 NOTE — Assessment & Plan Note (Signed)
12 month h/o low back pain, non radiiting , aggravated by bending and lifting. Xray report from ED reviewed, minimal arthritis, will benefit from PT and anti inflammatory course, referral entered Work excuse written also

## 2016-05-18 NOTE — Assessment & Plan Note (Signed)
Significant and worsening right sided neck and RUE pain requiring ED eval this past weekend, awakening pt, and having pt avoid use of the right hand. X ray c spine describes both spine and disc disease. Excellent response to medications started, refer for pT, work excuse to return 7/26, if not significantly improved will need MRI, however at this time no indication for this Gabapentin started for chronic pain and short course of ibuprofen and zantac prescribed

## 2016-05-18 NOTE — Patient Instructions (Signed)
F/u as before before, call if you need me sooner  Ibuprofen and zantac prescribed to take in addition to what you have Start gabapentin tonight in place of hydrocodone as this is a safer medication and indicated for long term use for nerve pain  Yiou are referred to PT twice weekly for 4 weeks

## 2016-05-18 NOTE — Progress Notes (Signed)
   Anthony Erickson.     MRN: [id number]      DOB: 11-Apr-1961   HPI Mr. Ancheta is here for ED follow up. He has a 4 month h/o intermittent right sided neck pain to hand , awakens him from his sleep on avg daily for the past 4 weeks, no weakness noted, but due to pain with ise of right hand he has been using his left instead. Low back pain for past 12 month, at its worst a 10, more frequent in past 3 months, experiences daily pain on arising , also intermittently on the job with bending and lifting, has awakened  Pt also , on avg 5 days per week, non radiaiting. No lower extremity weakness or numbness, no incontinence Last worked July 13, however requests leave untiluntil July 26  ROS Denies recent fever or chills. Denies sinus pressure, nasal congestion, ear pain or sore throat. Denies chest congestion, productive cough or wheezing. Denies chest pains, palpitations and leg swelling Denies abdominal pain, nausea, vomiting,diarrhea or constipation.   Denies dysuria, frequency, hesitancy or incontinence. Denies joint pain, swelling and limitation in mobility. Denies headaches, seizures, numbness, or tingling. Denies depression, anxiety or insomnia. Denies skin break down or rash.   PE  BP 122/84 mmHg  Pulse 96  Resp 16  Ht 5\' 7"  (1.702 m)  Wt 229 lb (103.874 kg)  BMI 35.86 kg/m2  SpO2 97%  Patient alert and oriented and in no cardiopulmonary distress.  HEENT: No facial asymmetry, EOMI,   oropharynx pink and moist.  Neck decreased ROM, no spasm no JVD, no mass.  Chest: Clear to auscultation bilaterally.  CVS: S1, S2 no murmurs, no S3.Regular rate.  ABD: Soft non tender.   Ext: No edema  MS: Adequate though reduced  ROM lumbar  Spine, normal shoulders, hips and knees.  Skin: Intact, no ulcerations or rash noted.  Psych: Good eye contact, normal affect. Memory intact not anxious or depressed appearing.  CNS: CN 2-12 intact, power,  normal throughout.no focal deficits  noted.   Assessment & Plan Neck pain on right side Significant and worsening right sided neck and RUE pain requiring ED eval this past weekend, awakening pt, and having pt avoid use of the right hand. X ray c spine describes both spine and disc disease. Excellent response to medications started, refer for pT, work excuse to return 7/26, if not significantly improved will need MRI, however at this time no indication for this Gabapentin started for chronic pain and short course of ibuprofen and zantac prescribed  Low back pain without sciatica 12 month h/o low back pain, non radiiting , aggravated by bending and lifting. Xray report from ED reviewed, minimal arthritis, will benefit from PT and anti inflammatory course, referral entered Work excuse written also

## 2016-05-27 ENCOUNTER — Telehealth: Payer: Self-pay | Admitting: Family Medicine

## 2016-05-27 NOTE — Telephone Encounter (Signed)
Error

## 2016-05-28 ENCOUNTER — Telehealth: Payer: Self-pay | Admitting: Family Medicine

## 2016-05-28 DIAGNOSIS — M542 Cervicalgia: Secondary | ICD-10-CM

## 2016-05-28 DIAGNOSIS — M545 Low back pain: Secondary | ICD-10-CM

## 2016-05-28 LAB — CBC WITH DIFFERENTIAL/PLATELET
Basophils Absolute: 0 {cells}/uL (ref 0–200)
Basophils Relative: 0 %
Eosinophils Absolute: 68 {cells}/uL (ref 15–500)
Eosinophils Relative: 1 %
HCT: 44.3 % (ref 38.5–50.0)
Hemoglobin: 15.4 g/dL (ref 13.2–17.1)
Lymphocytes Relative: 50 %
Lymphs Abs: 3400 {cells}/uL (ref 850–3900)
MCH: 32.8 pg (ref 27.0–33.0)
MCHC: 34.8 g/dL (ref 32.0–36.0)
MCV: 94.3 fL (ref 80.0–100.0)
MPV: 11.3 fL (ref 7.5–12.5)
Monocytes Absolute: 748 {cells}/uL (ref 200–950)
Monocytes Relative: 11 %
Neutro Abs: 2584 {cells}/uL (ref 1500–7800)
Neutrophils Relative %: 38 %
Platelets: 237 10*3/uL (ref 140–400)
RBC: 4.7 MIL/uL (ref 4.20–5.80)
RDW: 13.4 % (ref 11.0–15.0)
WBC: 6.8 10*3/uL (ref 3.8–10.8)

## 2016-05-28 LAB — HEMOGLOBIN A1C
Hgb A1c MFr Bld: 5.4 %
Mean Plasma Glucose: 108 mg/dL

## 2016-05-28 NOTE — Telephone Encounter (Signed)
plas call and notify pt, that I am unable to take him oput of work for as long as 6 weeks based on the clinnical info that I have.  He will need to see orthopedics , to justify this time IF the Doc feels that long of a time is indicated. Pls refer to ortho of his choice soonest available appt , generally Dr Luna Glasgow is readily available so I recommend him, unless pt has specific preference need appt in next 1 week pls I will sign referral , dx is  back pain

## 2016-05-28 NOTE — Telephone Encounter (Signed)
Patient aware and referral sent to [facility]

## 2016-06-03 ENCOUNTER — Encounter (HOSPITAL_COMMUNITY): Payer: Self-pay

## 2016-06-03 ENCOUNTER — Ambulatory Visit (HOSPITAL_COMMUNITY): Payer: BLUE CROSS/BLUE SHIELD | Attending: Family Medicine

## 2016-06-03 DIAGNOSIS — M545 Low back pain, unspecified: Secondary | ICD-10-CM

## 2016-06-03 DIAGNOSIS — M62838 Other muscle spasm: Secondary | ICD-10-CM | POA: Insufficient documentation

## 2016-06-03 DIAGNOSIS — R293 Abnormal posture: Secondary | ICD-10-CM | POA: Diagnosis present

## 2016-06-03 NOTE — Therapy (Signed)
[facility] [address] [place], [place], [address] Phone: [phone]   Fax:  [phone]  Physical Therapy Evaluation  Patient Details  Name: Anthony Erickson. MRN: [id number] Date of Birth: 1961-03-13 Referring Provider: Tula Nakayama  Encounter Date: 06/03/2016      PT End of Session - 06/03/16 1615    Visit Number 1   Number of Visits 6   Date for PT Re-Evaluation 06/24/16   Authorization Type BCBS   Authorization Time Period 06/24/16-07/15/16   Authorization - Visit Number 1   Authorization - Number of Visits 6   PT Start Time 1350   PT Stop Time 1526   PT Time Calculation (min) 96 min   Activity Tolerance Patient tolerated treatment well;Patient limited by pain   Behavior During Therapy [facility] for tasks assessed/performed      Past Medical History:  Diagnosis Date  . Cancer ([place])   . Condyloma acuminatum   . GERD (gastroesophageal reflux disease)   . Hyperlipidemia   . Obesity   . Prostate cancer ([place])   . Tendinitis    Of left hand     Past Surgical History:  Procedure Laterality Date  . APPENDECTOMY  12/13/2009  . CHEILECTOMY Right 01/24/2014   Procedure: CHEILECTOMY;  Surgeon: Carole Civil, MD;  Location: [facility];  Service: Orthopedics;  Laterality: Right;  . CHOLECYSTECTOMY  08/15/2010   Dr. Cornelia Copa  . ctr right hand      There were no vitals filed for this visit.       Subjective Assessment - 06/03/16 1352    Subjective Pt reports pain in the R neck for several months, most concerning when he was unable to move his arm on 7/14 upon waking. He went to the ED, perforomed some imaging that showed neural impingement on RUE at the neck . He reports shooting sensation in into the RUE is equal throughout the limb, equally dorsal/volar, and equally accross all digits. When pain is not shooting, he has a throbbing ache in the entire limb. Pt reports the RUE pain comes and goes, intermittently affecting  ability to work and havign to use his LUE more often. Pt reports he has had central low back pain for >1y.  Pain is worse first thing in morning, buit gradually loosens up after 15 minutes of moving.; once he gets moving, it tends to remain under contro.    Pertinent History None.    Limitations Sitting;Lifting;House hold activities  Neck pain does not limit reading, occasionally liimits head turns while driving.    How long can you sit comfortably? Pt reports >2 hour tolerance to sitting, not limited, but pain exacerbated.   How long can you stand comfortably? About 1 hour before he needs to sit (LBP); not limited greatly by neck pain    How long can you walk comfortably? Walks about 1 hours in morning and 1 hour in afternoon, without exacerbation, so long as he keeps moving.    Diagnostic tests xrays neck; xrays back   Patient Stated Goals reduce pain, and be able to return to work. Be able to lift 25-30lbs without exacerbation.    Currently in Pain? Yes   Pain Score 4    Pain Location Neck   Pain Orientation Right   Pain Descriptors / Indicators Aching   Pain Type Chronic pain   Pain Onset More than a month ago   Pain Frequency Constant   Aggravating Factors  Neck/arm/LBP: driving  in car for prolonged periods (30 minutes)    Pain Relieving Factors Medications help: Gabapentin    Multiple Pain Sites Yes   Pain Score 0   Pain Location Back   Pain Orientation Lower;Medial            [facility] PT Assessment - 06/03/16 0001      Assessment   Medical Diagnosis Low back pain/ R Neck pain with RUE referral    Referring Provider Tula Nakayama   Onset Date/Surgical Date 05/15/16  initital symptoms began >1YA   Hand Dominance Right   Next MD Visit 06/04/16   Prior Therapy None     Precautions   Precautions None     Restrictions   Weight Bearing Restrictions No     Balance Screen   Has the patient fallen in the past 6 months No   Has the patient had a decrease in activity level  because of a fear of falling?  Yes   Is the patient reluctant to leave their home because of a fear of falling?  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    Right Shoulder Internal Rotation 25 Degrees   Right Shoulder External Rotation 31 Degrees   Left Shoulder Flexion 154 Degrees   Left Shoulder Internal Rotation 80 Degrees   Left Shoulder External Rotation 53 Degrees   Right/Left Hip Right;Left   Right Hip Flexion 105   limited by anterior pelvic tilt   Left Hip Flexion 105   limited by anterior pelvic tilt   Cervical Flexion 45  chin touches sternum   Cervical Extension 34   Cervical - Right Side Bend 31   Cervical - Left Side Bend 35   Cervical - Right Rotation 66   Cervical - Left Rotation 61   Lumbar Flexion None   remains in lordosis during toe touch (no flexion)    Lumbar Extension WNL with overhead reach    Lumbar - Right Rotation 45 degrees at shoulders  narrow stance standing   Lumbar - Left Rotation 45 degrees at shoulders  narrow stance standing     Strength   Strength Assessment Site Shoulder;Elbow;Forearm;Wrist;Hand;Other (comment);Hip;Knee   Right/Left Shoulder --  Shrug: 5/5 bilat, =, non painful   Right Shoulder Flexion 4-/5  painful near ant deltoid   Right Shoulder ABduction 5/5   Right Shoulder Internal Rotation 5/5  screened seated   Right Shoulder External Rotation 5/5  screened seated   Left Shoulder Flexion 5/5    Left Shoulder ABduction 5/5   Left Shoulder Internal Rotation 5/5  screened seated   Left Shoulder External Rotation 5/5  screened seated   Right Elbow Flexion 5/5  75% of Left side   Right Elbow Extension 5/5  much weaker than L side   Left Elbow Flexion 5/5   Left Elbow Extension 5/5   Right Wrist Flexion 5/5  Right Wrist Extension 5/5   Left Wrist Flexion 5/5   Left Wrist Extension 5/5   Right/Left hand Right;Left   Right Hand Gross Grasp --  digital abd/add WNL, =bilat   Right Hand Grip (lbs) 60, 45, 45 (lbs)    Left Hand Gross Grasp --  digital abd/add WNL, =bilat   Left Hand Grip (lbs) 80, 100, 80 (lbs)    Right/Left Hip Right;Left   Right Hip Flexion 5/5   Right Hip Extension 5/5  prone, knee extended   Right Hip External Rotation  5/5   Right Hip Internal Rotation 5/5   Right Hip ABduction 5/5  TFL dominant   Right Hip ADduction 5/5   Left Hip Flexion 5/5   Left Hip Extension 5/5  prone, knee extended   Left Hip External Rotation 5/5   Left Hip Internal Rotation 5/5   Left Hip ABduction 5/5  TFL dominant   Left Hip ADduction 5/5   Right/Left Knee Right;Left   Right Knee Flexion 5/5   Right Knee Extension 5/5   Left Knee Flexion 5/5   Left Knee Extension 5/5     Palpation   Spinal mobility Excessive lordosis in flexion     Special Tests    Special Tests Cervical;Hip Special Tests   Cervical Tests Spurling's;other   Hip Special Tests  Thomas Test     Spurling's   Findings Negative   Side Right   Comment Tested Spurlings I, II     other    Findings Negative   Side Right   Comment Full can/empty can  inconsistent findings, painful in all positions      [employer] Positive   Side --  bilat: R>L                   [facility] Adult PT Treatment/Exercise - 06/03/16 0001      Exercises   Exercises Lumbar;Neck     Neck Exercises: Seated   Other Seated Exercise L ear to L axilla stretch (R UT)   3x30sec seated (HEP)   Other  Seated Exercise seated prayer stretch, elbows on table hands clasped   (issued for HEP, not reviewed yet)      Lumbar Exercises: Stretches   Double Knee to Chest Stretch 3 reps;30 seconds  seated chair flexion stretch   Double Knee to Chest Stretch Limitations HEP education   Lower Trunk Rotation 1 rep;30 seconds  bilat; chair rotation stretch   Lower Trunk Rotation Limitations HEP education   Hip Flexor Stretch 3 reps;30 seconds  bilat; thomas test position- SK[facility]   Hip Flexor Stretch Limitations aggravated RUE  some  HEP education                PT Education - 06/03/16 1614    Education provided Yes   Education Details Explained how neck pain is likely related to loss of R shoulder mobility and overuse of shoulder flexors and postural limitations during sleep.    Person(s) Educated Patient   Methods Explanation   Comprehension Verbalized understanding          PT Short Term Goals - 06/03/16 1939      PT SHORT TERM GOAL #1   Title After 2 weeks pt will demonstrate independence and compliance with starter HEP.    Status New     PT SHORT TERM GOAL #2   Title After 3 weeks pt will reports 0/10 low back pain upon waking >2d/week.  Status New     PT SHORT TERM GOAL #3   Title After 3 weeks pt will demonstrate thomas test hip extension of 0* to improve mobility and ergonomics during gait.    Status New           PT Long Term Goals - 06/03/16 1942      PT LONG TERM GOAL #1   Title After week 6 pt will demonstrate independence with advanced HEP to further progress after DC.   Status New     PT LONG TERM GOAL #2   Title After 6 weeks pt will demonstrate >115 degrees hip flexion bilat during thomas test posture to improve comfort during prolonged sitting.   Status New     PT LONG TERM GOAL #3   Title After 6 weeks pt will demonstrate neutral lumbar spine curvature during standing toe touch and standing narrowstance trunk rotation of >70 degrees to improve  trunk stability during gait and standing.    Status New               Plan - 06/03/16 1634    Clinical Impression Statement Regarding the low back pain, patient presenting with impairment of lumbar spine hypomobililty, tightness in hip flexors B, relative loss of hip flexion B, and postural dysfunction. The above impairments create pain with prolonged immobility causing difficulty performing sleeping at night and driving for [id number] minutes. Regarding the neck/RUE pain: pt presents with mild, symmetrical cervical hypo mobility, particularly in rotation and side-bending, which I suspect are related to muscle hypertrophy in the shoulders. Light touch sensation is intact, however LT along the medial R neck is allodynic. Strength is 5/5 bilaterally, but the patient clearly has some globalized weakness in the RUE (no specific myotomal level), with varying degrees of pain during testing. Grip strength is also decreased on R (dominant) side. ROM testing of the neck does not provoke symptoms, nor does Spurlings I/II (special test for radicular neck pan).  Most provocative are tests of R shoulder flexion, equally weak and painful in several positions (empty can/full can/supinated/scaption) making focal lesion less like, and more suspect of capsular joint pathology. Most pain resultant of testing anterior right side UT, deltoid and/or supraspinatus.  Palpation of the R upper traps reveals hypertonicity and tenderness. ROM loss in the R shoulder can be seen in GHJ external rotation, internal rotation, and flexion. Findings are inconsistent with typical presentation of radicular neck pain. Pt presentation is more consistent with adhesive capsulitis (mid-stage) with secondary muscle pain related to loss of mobility. Pt given a stretch for R upper trap stretches and will ask referring physician for OT referral to evaluation the R shoulder.    Rehab Potential Good   PT Frequency 1x / week   PT Duration 6 weeks   PT  Treatment/Interventions Moist Heat;Patient/family education;Passive range of motion;Manual techniques;Therapeutic exercise   PT Next Visit Plan Review HEP; Abdominal bracing and strengthing   PT Home Exercise Plan issued    Recommended Other Services OT for shoulder    Consulted and Agree with Plan of Care Patient      Patient will benefit from skilled therapeutic intervention in order to improve the following deficits and impairments:  Decreased activity tolerance, Decreased strength, Decreased knowledge of precautions, Decreased range of motion, Hypomobility, Increased muscle spasms, Impaired flexibility, Pain, Obesity  Visit Diagnosis: Midline low back pain without sciatica - Plan: PT plan of care cert/re-cert  Abnormal posture - Plan: PT plan of care cert/re-cert  Other muscle spasm - Plan: PT plan of care cert/re-cert     Problem List Patient Active Problem List   Diagnosis Date Noted  . Neck pain on right side 05/18/2016  . Low back pain without sciatica 05/18/2016  . Seasonal allergies 11/22/2014  . Adenocarcinoma of prostate ([place]) 08/27/2011  . VISUAL ACUITY, DECREASED 09/17/2008  . Hyperlipidemia LDL goal <100 05/02/2008  . Obesity 05/02/2008  . GERD 05/02/2008   7:51 PM, 06/03/16 Etta Grandchild, PT, DPT Physical Therapist at [facility] [phone] (office)     [facility] [address] [place], [place], [address] Phone: [phone]   Fax:  [phone]  Name: Anthony Erickson. MRN: [id number] Date of Birth: 1961-01-28

## 2016-06-04 ENCOUNTER — Ambulatory Visit (INDEPENDENT_AMBULATORY_CARE_PROVIDER_SITE_OTHER): Payer: BLUE CROSS/BLUE SHIELD | Admitting: Family Medicine

## 2016-06-04 ENCOUNTER — Encounter: Payer: Self-pay | Admitting: Family Medicine

## 2016-06-04 DIAGNOSIS — M25511 Pain in right shoulder: Secondary | ICD-10-CM

## 2016-06-04 DIAGNOSIS — M542 Cervicalgia: Secondary | ICD-10-CM | POA: Diagnosis not present

## 2016-06-04 DIAGNOSIS — M545 Low back pain, unspecified: Secondary | ICD-10-CM

## 2016-06-04 MED ORDER — GABAPENTIN 400 MG PO CAPS: 400.0000 mg | ORAL_CAPSULE | Freq: Every day | ORAL | 3 refills | Status: DC

## 2016-06-04 NOTE — Patient Instructions (Addendum)
F/u in 4 month, call if you need me sooner  Increase dose of gabapentin to 400 mg daily  Keep appt with orthopedics next week, and Dr Luna Glasgow will direct management of neck, right shoulder and back pain moving forward.  You will get a work note today for your job to keep you out until ortho sees you  An addendum will be sent to your ins carrier to cover you until that date  [facility] you feel better soon  Thank you  for choosing [facility]. We consider it a privelige to serve you.  Delivering excellent health care in a caring and  compassionate way is our goal.  Partnering with you,  so that together we can achieve this goal is our strategy.

## 2016-06-04 NOTE — Progress Notes (Signed)
   Anthony Erickson.     MRN: [id number]      DOB: 1961/02/07   HPI Anthony Erickson is here for follow up and re-evaluation of neck , RUE pain and low back pain Neck and upper ext pain is a 5 , back pain much improved, has been to PT and will start OT for right shoulder pain and reduced mobility also. Still needs ortho eval for prolonged care and oversight esp as it relates to his ability to work,after inittial visit he had called back requesting to be out for 6 weeks, as I have explained ,  He will need ortho to supervise such a long LOA, and based on hi job and the need to  Constantly use arms and to bend, it is better that he has the expertise of an ortho Doc to guide his ability to work, direct job functioins/ restrictions, an to supervise current condition. He understands and agreees . Awakens with pain part way through night, an inc dose of gabapentin will be benficial ROS Denies recent fever or chills. Denies sinus pressure, nasal congestion, ear pain or sore throat. Denies chest congestion, productive cough or wheezing. Denies chest pains, palpitations and leg swelling Denies abdominal pain, nausea, vomiting,diarrhea or constipation.   Denies dysuria, frequency, hesitancy or incontinence. Denies headaches, seizures,  Denies depression, anxiety or insomnia. Denies skin break down or rash.   PE  BP 118/82   Pulse 93   Resp 16   Ht 5\' 7"  (1.702 m)   Wt 234 lb (106.1 kg)   SpO2 96%   BMI 36.65 kg/m   Patient alert and oriented and in no cardiopulmonary distress.  HEENT: No facial asymmetry, EOMI,   oropharynx pink and moist.  Neck decreased ROM neck with mild right trapezius spasm, no JVD, no mass.  Chest: Clear to auscultation bilaterally.  CVS: S1, S2 no murmurs, no S3.Regular rate.  ABD: Soft non tender.   Ext: No edema  MS: Adequate ROM spine, , hips and knees.Mildly reduced ROM Right shoulder due to pain  Skin: Intact, no ulcerations or rash noted.  Psych: Good eye  contact, normal affect. Memory intact not anxious or depressed appearing.  CNS: CN 2-12 intact, power,  normal throughout.no focal deficits noted.   Assessment & Plan  Neck pain on right side Inadequate control, inc gabapentin dose, continue PT/OT, also refer to orhto for ongoing management  Right shoulder pain Chronic rated at a 5, recently started OT, needs ortho eval and further management, pt uses right shoulder and arm constantly on the job, currently out of work due uncontrolled symptoms of pain and reduced function  Low back pain without sciatica Chronic low back poain, somewhat improved since out of work, continue therapy and f/u with ortho

## 2016-06-04 NOTE — Assessment & Plan Note (Addendum)
Inadequate control, inc gabapentin dose, continue PT/OT, also refer to orhto for ongoing management

## 2016-06-07 DIAGNOSIS — M25511 Pain in right shoulder: Secondary | ICD-10-CM | POA: Insufficient documentation

## 2016-06-07 NOTE — Assessment & Plan Note (Signed)
Chronic low back poain, somewhat improved since out of work, continue therapy and f/u with ortho

## 2016-06-07 NOTE — Assessment & Plan Note (Signed)
Chronic rated at a 5, recently started OT, needs ortho eval and further management, pt uses right shoulder and arm constantly on the job, currently out of work due uncontrolled symptoms of pain and reduced function

## 2016-06-10 ENCOUNTER — Encounter: Payer: Self-pay | Admitting: Orthopedic Surgery

## 2016-06-10 ENCOUNTER — Telehealth (HOSPITAL_COMMUNITY): Payer: Self-pay

## 2016-06-10 ENCOUNTER — Ambulatory Visit (INDEPENDENT_AMBULATORY_CARE_PROVIDER_SITE_OTHER): Payer: BLUE CROSS/BLUE SHIELD | Admitting: Orthopedic Surgery

## 2016-06-10 ENCOUNTER — Encounter (HOSPITAL_COMMUNITY): Payer: Self-pay

## 2016-06-10 ENCOUNTER — Ambulatory Visit (HOSPITAL_COMMUNITY): Payer: BLUE CROSS/BLUE SHIELD

## 2016-06-10 VITALS — BP 152/81 | HR 111 | Ht 66.0 in | Wt 233.0 lb

## 2016-06-10 DIAGNOSIS — M5020 Other cervical disc displacement, unspecified cervical region: Secondary | ICD-10-CM | POA: Diagnosis not present

## 2016-06-10 DIAGNOSIS — R293 Abnormal posture: Secondary | ICD-10-CM

## 2016-06-10 DIAGNOSIS — M545 Low back pain, unspecified: Secondary | ICD-10-CM

## 2016-06-10 DIAGNOSIS — M62838 Other muscle spasm: Secondary | ICD-10-CM

## 2016-06-10 NOTE — Therapy (Addendum)
PHYSICAL THERAPY DISCHARGE SUMMARY  Visits from Start of Care: 2  Current functional level related to goals / functional outcomes: *patient has not returned since his last visit.    Remaining deficits: *see below    Education / Equipment: *see below  Plan: Patient agrees to discharge.  Patient goals were not met. Patient is being discharged due to not returning since the last visit.  ?????    4:35 PM, 12/10/16 Etta Grandchild, PT, DPT Physical Therapist at [facility] [phone] (office)         [facility] [address] [place], [place], [address] Phone: [phone]   Fax:  [phone]  Physical Therapy Treatment  Patient Details  Name: Anthony Erickson. MRN: [id number] Date of Birth: 10-Dec-1960 Referring Provider: Tula Nakayama  Encounter Date: 06/10/2016      PT End of Session - 06/10/16 1631    Visit Number 2   Number of Visits 6   Date for PT Re-Evaluation 06/24/16   Authorization Type BCBS   Authorization Time Period 06/24/16-07/15/16   Authorization - Visit Number 2   Authorization - Number of Visits 6   PT Start Time 8250  Therpist running behind   PT Stop Time 1033   PT Time Calculation (min) 31 min   Activity Tolerance Patient tolerated treatment well;Patient limited by pain   Behavior During Therapy [facility] for tasks assessed/performed      Past Medical History:  Diagnosis Date  . Cancer ([place])   . Condyloma acuminatum   . GERD (gastroesophageal reflux disease)   . Hyperlipidemia   . Obesity   . Prostate cancer ([place])   . Tendinitis    Of left hand     Past Surgical History:  Procedure Laterality Date  . APPENDECTOMY  12/13/2009  . CHEILECTOMY Right 01/24/2014   Procedure: CHEILECTOMY;  Surgeon: Carole Civil, MD;  Location: [facility];  Service: Orthopedics;  Laterality: Right;  . CHOLECYSTECTOMY  08/15/2010   Dr. Cornelia Copa  . ctr right hand      There were no  vitals filed for this visit.      Subjective Assessment - 06/10/16 1020    Subjective Pt reports he is doing well today. He says his syptoms have been mostly unchanged. HEP has been performed, but not exactly as directed. He has followed up with referring physician who has referred him to ortho for shoulder issue.                          [facility] Adult PT Treatment/Exercise - 06/10/16 0001      Neck Exercises: Seated   Other Seated Exercise L ear to L axilla stretch (R UT)   3x30sec; VC to decrease force, (+) flexio   Other Seated Exercise seated prayer stretch, elbows on table hands clasped   (issued for HEP, not reviewed yet)      Lumbar Exercises: Stretches   Double Knee to Chest Stretch 3 reps;30 seconds  seated chair flexion stretch   Double Knee to Chest Stretch Limitations HEP review; corrected reps/hold times  Prone DKTC rocking 1x20   Lower Trunk Rotation 1 rep;30 seconds  bilat; chair rotation stretch   Lower Trunk Rotation Limitations DC'd this, as it was aggravating pain.    Hip Flexor Stretch 3 reps;30 seconds  bilat; thomas test position- [facility]   Hip Flexor Stretch Limitations Not performed at home;  reviewed with patient, asked to perform.      Lumbar Exercises: Sidelying   Other Sidelying Lumbar Exercises Trunk rotation stretch  20x1secH bilat; (will replace chair seated rot stretch  HEP)                  PT Short Term Goals - 06/03/16 1939      PT SHORT TERM GOAL #1   Title After 2 weeks pt will demonstrate independence and compliance with starter HEP.    Status New     PT SHORT TERM GOAL #2   Title After 3 weeks pt will reports 0/10 low back pain upon waking >2d/week.    Status New     PT SHORT TERM GOAL #3   Title After 3 weeks pt will demonstrate thomas test hip extension of 0* to improve mobility and ergonomics during gait.    Status New           PT Long Term Goals - 06/03/16 1942      PT LONG TERM GOAL #1   Title  After week 6 pt will demonstrate independence with advanced HEP to further progress after DC.   Status New     PT LONG TERM GOAL #2   Title After 6 weeks pt will demonstrate >115 degrees hip flexion bilat during thomas test posture to improve comfort during prolonged sitting.   Status New     PT LONG TERM GOAL #3   Title After 6 weeks pt will demonstrate neutral lumbar spine curvature during standing toe touch and standing narrowstance trunk rotation of >70 degrees to improve trunk stability during gait and standing.    Status New               Plan - 06/10/16 1632    Clinical Impression Statement Pt seen today for PT session; reviewed goals with patient, he is agreebale. Reviewed HEP: pt was performing 65% of activities, but not as prescribed. PT corrected sets/reps/hold-tims, and corrected form to help improve tolerance to HEP. Pt demonstrates good understanding of goals and technique of HEP after this session. Making some progress toward goals, however Shoulder pain did flare up and was very painful. *Of note, MD note does mention movign to OT to address shoulder mobility restrictions, however pt reports that she said she would like this to come from Dr. Aline Brochure once he has seen the patient.    Rehab Potential Good   PT Frequency 1x / week   PT Duration 6 weeks   PT Treatment/Interventions Moist Heat;Patient/family education;Passive range of motion;Manual techniques;Therapeutic exercise   PT Next Visit Plan Review HEP; Abdominal bracing and strengthing   PT Home Exercise Plan Reviewed and updated.    Consulted and Agree with Plan of Care Patient      Patient will benefit from skilled therapeutic intervention in order to improve the following deficits and impairments:  Decreased activity tolerance, Decreased strength, Decreased knowledge of precautions, Decreased range of motion, Hypomobility, Increased muscle spasms, Impaired flexibility, Pain, Obesity  Visit Diagnosis: Midline  low back pain without sciatica  Abnormal posture  Other muscle spasm     Problem List Patient Active Problem List   Diagnosis Date Noted  . Right shoulder pain 06/07/2016  . Neck pain on right side 05/18/2016  . Low back pain without sciatica 05/18/2016  . Seasonal allergies 11/22/2014  . Adenocarcinoma of prostate ([place]) 08/27/2011  . VISUAL ACUITY, DECREASED 09/17/2008  . Hyperlipidemia LDL goal <100 05/02/2008  . Obesity  05/02/2008  . GERD 05/02/2008    4:37 PM, 06/10/16 Etta Grandchild, PT, DPT Physical Therapist at [facility] [phone] (office)     [facility] [address] [place], [place], [address] Phone: [phone]   Fax:  [phone]  Name: Anthony Erickson. MRN: [id number] Date of Birth: 1961-01-29

## 2016-06-10 NOTE — Progress Notes (Signed)
Chief Complaint  Patient presents with  . 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 Although he had had intermittent symptoms of pain in his cervical spine he woke up one morning with a numb arm that he couldn't move. After seeking medical treatment and workup it was found that he has severe cervical disc disease.  He notes burning pain in his entire right arm radiating down to his hand he has weakness in grip and with extension of his elbow.  His pain is 8 out of 10 despite being on ibuprofen gabapentin and a trial of steroids. He could not tolerate the steroids the upset his stomach. He also took some hydrocodone.  Treatment also has included physical therapies already gone to his second visit. No improvement as of yet  (8 out of 10 pain, night, sharp stabbing, tingling.)  Review of Systems  Constitutional: Negative.   HENT: Negative.   Eyes: Negative.   Respiratory: Negative.   Cardiovascular: Negative.   Gastrointestinal: Negative.   Genitourinary: Negative.   Musculoskeletal: Positive for back pain and joint pain.  Skin: Negative.   Neurological: Positive for tingling, sensory change and focal weakness.  Endo/Heme/Allergies: Negative.     Past Medical History:  Diagnosis Date  . Cancer ([place])   . Condyloma acuminatum   . GERD (gastroesophageal reflux disease)   . Hyperlipidemia   . Obesity   . Prostate cancer ([place])   . Tendinitis    Of left hand     Past Surgical History:  Procedure Laterality Date  . APPENDECTOMY  12/13/2009  . CHEILECTOMY Right 01/24/2014   Procedure: CHEILECTOMY;  Surgeon: Carole Civil, MD;  Location: [facility];  Service: Orthopedics;  Laterality: Right;  . CHOLECYSTECTOMY  08/15/2010   Dr. Cornelia Copa  . ctr right hand     Family History  Problem Relation Age of Onset  .  Hypertension Mother   . Diabetes Mother   . Hyperlipidemia Mother   . GER disease Father   . Hypertension Father   . Hyperlipidemia Father   . Diabetes Sister   . Hypertension Sister   . Cancer Sister     ovarian   . Diabetes      Family History   . Cancer      Family History    Social History  Substance Use Topics  . Smoking status: Never Smoker  . Smokeless tobacco: Never Used  . Alcohol use No     Comment: Occasional     Current Outpatient Prescriptions:  .  gabapentin (NEURONTIN) 400 MG capsule, Take 1 capsule (400 mg total) by mouth at bedtime., Disp: 30 capsule, Rfl: 3 .  Green Tea, Camillia sinensis, (GREEN TEA EXTRACT PO), Take 1 capsule by mouth daily., Disp: , Rfl:  .  HYDROcodone-acetaminophen (NORCO/VICODIN) 5-325 MG tablet, Take 1 tablet by mouth every 4 (four) hours as needed., Disp: 15 tablet, Rfl: 0 .  Multiple Vitamin (MULTI VITAMIN MENS PO), Take 1 tablet by mouth daily. , Disp: , Rfl:  .  Omega-3 Fatty Acids (FISH OIL) 1000 MG CAPS, Take 1,000 mg by mouth daily. , Disp: , Rfl:  .  Pomegranate, Punica granatum, (POMEGRANATE PO), Take 1 capsule by mouth daily., Disp: , Rfl:  .  ranitidine (ZANTAC) 150 MG capsule, Take 1 capsule (150 mg total)  by mouth 2 (two) times daily., Disp: 28 capsule, Rfl: 0  BP (!) 152/81   Pulse (!) 111   Ht 5\' 6"  (1.676 m)   Wt 233 lb (105.7 kg)   BMI 37.61 kg/m   Physical Exam  Constitutional: He is oriented to person, place, and time. He appears well-developed and well-nourished. No distress.  Cardiovascular: Normal rate and intact distal pulses.   Neurological: He is alert and oriented to person, place, and time.  Skin: Skin is warm and dry. No rash noted. He is not diaphoretic. No erythema. No pallor.  Psychiatric: He has a normal mood and affect. His behavior is normal. Judgment and thought content normal.    Ortho Exam  Cervical spine exam shows he has painful range of motion in the cervical spine. He cannot turn to his  right as well as he can to his left. He has painful extension with radicular pain running down his right arm he has a positive Spurling sign with pain running down the right arm. He has triceps weakness grade 4 on the right versus grade 5 on the left. The biceps and remaining musculature has normal strength right and left side  There is tenderness in the cervical spine on the right side of the cervical spine.  Axillary/supraclavicular lymph nodes normal in each shoulder  Shoulder range of motion is bilaterally normal and supraspinatus strength is normal in each shoulder.  In terms of his reflexes he has 2+ biceps reflex 2+ forearm reflex but 1+ versus 2+ right triceps reflex   ASSESSMENT: My personal interpretation of the images:  C-spine x-ray show severe cervical disc disease   FINDINGS: Six views of the cervical spine submitted. No acute fracture or subluxation. Mild degenerative changes C1-C2 articulation. There is moderate anterior spurring lower endplate of C3, C4 and C5 vertebral body. Mild disc space flattening at C4-C5. Moderate disc space flattening at C5-C6 level. Mild disc space flattening with anterior spurring at C6-C7 level. No prevertebral soft tissue swelling. Cervical airway is patent.   IMPRESSION: No acute fracture or subluxation. Multilevel degenerative changes as described above.     Electronically Signed   By: Lahoma Crocker M.D.   On: 05/15/2016 11:57    PLAN Continue physical therapy  Continue ibuprofen  Recommend MRI cervical spine the patient has weakness positive Spurling sign positive x-ray for degenerative arthritis and degenerative disc disease is at high risk for myelopathy with the weakness. He will need neurosurgical consultation after MRI.  Arther Abbott, MD 06/10/2016 4:53 PM

## 2016-06-10 NOTE — Telephone Encounter (Signed)
PT noticed last MD note reports that pt has started OT. Pt has not started OT because we are still waiting for a signed order. PT spoke with representatives in office. PT asked to for follow-up with OT referral, that was faxed last week, but not yet returned with signature. Office staff asked that we refax the order so that they can sign.   4:49 PM, 06/10/16 Etta Grandchild, PT, DPT Physical Therapist at [place] [phone] (office)

## 2016-06-16 ENCOUNTER — Telehealth: Payer: Self-pay | Admitting: Orthopedic Surgery

## 2016-06-16 NOTE — Telephone Encounter (Signed)
Patient wants to know if his MRi has been approved hyet.  Please call him and let him know if MRI has been approved.  Thanks

## 2016-06-17 NOTE — Telephone Encounter (Signed)
YES PATIENT AWARE OF SCHEDULED APPOINTMENT TIMES

## 2016-06-18 ENCOUNTER — Telehealth (HOSPITAL_COMMUNITY): Payer: Self-pay

## 2016-06-18 ENCOUNTER — Ambulatory Visit (HOSPITAL_COMMUNITY): Payer: BLUE CROSS/BLUE SHIELD | Admitting: Physical Therapy

## 2016-06-18 NOTE — Telephone Encounter (Signed)
Left message that he had to cancel, something has come up

## 2016-06-24 ENCOUNTER — Telehealth (HOSPITAL_COMMUNITY): Payer: Self-pay

## 2016-06-24 ENCOUNTER — Ambulatory Visit (HOSPITAL_COMMUNITY): Payer: BLUE CROSS/BLUE SHIELD

## 2016-06-24 NOTE — Telephone Encounter (Signed)
cx - said that a family emergency has come up

## 2016-06-26 ENCOUNTER — Ambulatory Visit: Payer: BLUE CROSS/BLUE SHIELD | Admitting: Orthopedic Surgery

## 2016-06-30 ENCOUNTER — Ambulatory Visit (HOSPITAL_COMMUNITY)
Admission: RE | Admit: 2016-06-30 | Discharge: 2016-06-30 | Disposition: A | Discharge status: Home / Self Care | Payer: BLUE CROSS/BLUE SHIELD | Source: Ambulatory Visit | Attending: Orthopedic Surgery | Admitting: Orthopedic Surgery

## 2016-06-30 DIAGNOSIS — M5020 Other cervical disc displacement, unspecified cervical region: Secondary | ICD-10-CM | POA: Diagnosis not present

## 2016-06-30 DIAGNOSIS — J320 Chronic maxillary sinusitis: Secondary | ICD-10-CM | POA: Insufficient documentation

## 2016-06-30 DIAGNOSIS — M50321 Other cervical disc degeneration at C4-C5 level: Secondary | ICD-10-CM | POA: Insufficient documentation

## 2016-07-01 ENCOUNTER — Ambulatory Visit (HOSPITAL_COMMUNITY): Payer: BLUE CROSS/BLUE SHIELD | Admitting: Physical Therapy

## 2016-07-01 ENCOUNTER — Telehealth (HOSPITAL_COMMUNITY): Payer: Self-pay | Admitting: Physical Therapy

## 2016-07-01 NOTE — Telephone Encounter (Signed)
Patient a no-show for today's session; called and spoke to patient, who apologized and confirms that he will be at his next scheduled appointment.   Deniece Ree PT, DPT [phone]

## 2016-07-02 ENCOUNTER — Encounter: Payer: Self-pay | Admitting: Orthopedic Surgery

## 2016-07-02 ENCOUNTER — Ambulatory Visit (INDEPENDENT_AMBULATORY_CARE_PROVIDER_SITE_OTHER): Payer: BLUE CROSS/BLUE SHIELD | Admitting: Orthopedic Surgery

## 2016-07-02 VITALS — BP 124/84 | HR 108 | Ht 66.0 in | Wt 237.0 lb

## 2016-07-02 DIAGNOSIS — M4802 Spinal stenosis, cervical region: Secondary | ICD-10-CM

## 2016-07-02 NOTE — Progress Notes (Addendum)
Patient ID: Anthony Rhymes., male   DOB: 13-Aug-1961, 55 y.o.   MRN: [id number]  MRI FOLLOW UP  Chief Complaint  Patient presents with  . Follow-up    REVIEW MRI C SPINE    HPI Anthony Okabe. is a 55 y.o. male.   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 Although he had had intermittent symptoms of pain in his cervical spine he woke up one morning with a numb arm that he couldn't move. After seeking medical treatment and workup it was found that he has severe cervical disc disease.   He notes burning pain in his entire right arm radiating down to his hand he has weakness in grip and with extension of his elbow.   His pain is 8 out of 10 despite being on ibuprofen gabapentin and a trial of steroids. He could not tolerate the steroids the upset his stomach. He also took some hydrocodone.   Treatment also has included physical therapies already gone to his second visit. No improvement as of yet  MRI OF THE Cervical spine  Review of Systems  Constitutional: Negative.   HENT: Negative.   Eyes: Negative.   Respiratory: Negative.   Cardiovascular: Negative.   Gastrointestinal: Negative.   Genitourinary: Negative.   Musculoskeletal: Positive for back pain and joint pain.  Skin: Negative.   Neurological: Positive for tingling, sensory change and focal weakness.  Endo/Heme/Allergies: Negative.     Physical Exam   Data  Independent image interpretation the MRI showed severe cervical spondylosis and spinal stenosis The report was read as follows IMPRESSION: 1. Cervical spondylosis, short pedicles, and degenerative disc disease, resulting in moderate to prominent impingement at C4-5 ; moderate impingement at C5-6 and C6-7 ; and mild impingement at C2- 3, C3-4, and C7-T1, as detailed above. 2. Chronic right maxillary sinusitis.     Electronically Signed   By: Van Clines M.D.   On: 06/30/2016 20:40  Encounter Diagnosis  Name Primary?  . Spinal stenosis in cervical region Yes   Condition worsening  The plan is to make referral to neurosurgery Anthony Abbott, MD 07/02/2016 4:17 PM

## 2016-07-02 NOTE — Patient Instructions (Signed)
WE WILL REFER YOU TO [facility]

## 2016-07-07 ENCOUNTER — Telehealth: Payer: Self-pay

## 2016-07-08 ENCOUNTER — Telehealth (HOSPITAL_COMMUNITY): Payer: Self-pay

## 2016-07-08 ENCOUNTER — Ambulatory Visit (HOSPITAL_COMMUNITY): Payer: BLUE CROSS/BLUE SHIELD | Attending: Family Medicine

## 2016-07-08 NOTE — Telephone Encounter (Signed)
Advised patient that office notes have been sent to them and to expect a call from their office.

## 2016-07-08 NOTE — Telephone Encounter (Signed)
PT called patient after not showing for appointment. Patient reports that he apologizes and that his father has been in the hospital and he has been occupied assisting in his care. He says he intends on returning to PT in the future. He is made aware that he has no future appointments scheduled at this time, and that he will need to call back to schedule.   10:19 AM, 07/08/16 Etta Grandchild, PT, DPT Physical Therapist at [place] [phone] (office)

## 2016-07-13 NOTE — Telephone Encounter (Signed)
Patient called to relay he has been scheduled at [facility] w/Dr Saintclair Halsted, 07/16/16, 9:00am.

## 2016-10-06 ENCOUNTER — Ambulatory Visit: Payer: BLUE CROSS/BLUE SHIELD | Admitting: Family Medicine

## 2016-11-23 DIAGNOSIS — C61 Malignant neoplasm of prostate: Secondary | ICD-10-CM | POA: Diagnosis not present

## 2016-12-07 ENCOUNTER — Ambulatory Visit: Payer: BLUE CROSS/BLUE SHIELD | Admitting: Family Medicine

## 2016-12-07 ENCOUNTER — Encounter: Payer: Self-pay | Admitting: Family Medicine

## 2016-12-07 ENCOUNTER — Ambulatory Visit (INDEPENDENT_AMBULATORY_CARE_PROVIDER_SITE_OTHER): Payer: 59 | Admitting: Family Medicine

## 2016-12-07 VITALS — BP 120/74 | HR 104 | Resp 15 | Ht 66.0 in | Wt 224.0 lb

## 2016-12-07 DIAGNOSIS — C61 Malignant neoplasm of prostate: Secondary | ICD-10-CM | POA: Diagnosis not present

## 2016-12-07 DIAGNOSIS — F5101 Primary insomnia: Secondary | ICD-10-CM | POA: Diagnosis not present

## 2016-12-07 DIAGNOSIS — E6609 Other obesity due to excess calories: Secondary | ICD-10-CM | POA: Diagnosis not present

## 2016-12-07 DIAGNOSIS — E785 Hyperlipidemia, unspecified: Secondary | ICD-10-CM | POA: Diagnosis not present

## 2016-12-07 DIAGNOSIS — M542 Cervicalgia: Secondary | ICD-10-CM

## 2016-12-07 MED ORDER — TEMAZEPAM 15 MG PO CAPS: 15.0000 mg | ORAL_CAPSULE | Freq: Every day | ORAL | 4 refills | Status: DC

## 2016-12-07 NOTE — Patient Instructions (Addendum)
Physical exam in 5 month, call if you need me sooner  Fasting lipid, cmp, , TSH , vit D, CBC.this week if possible  New medication sent for sleep  Practice good sleep hygiene  Congrats on new beginnings and weight loss     Insomnia Insomnia is a sleep disorder that makes it difficult to fall asleep or to stay asleep. Insomnia can cause tiredness (fatigue), low energy, difficulty concentrating, mood swings, and poor performance at work or school. There are three different ways to classify insomnia:  Difficulty falling asleep.  Difficulty staying asleep.  Waking up too early in the morning. Any type of insomnia can be long-term (chronic) or short-term (acute). Both are common. Short-term insomnia usually lasts for three months or less. Chronic insomnia occurs at least three times a week for longer than three months. What are the causes? Insomnia may be caused by another condition, situation, or substance, such as:  Anxiety.  Certain medicines.  Gastroesophageal reflux disease (GERD) or other gastrointestinal conditions.  Asthma or other breathing conditions.  Restless legs syndrome, sleep apnea, or other sleep disorders.  Chronic pain.  Menopause. This may include hot flashes.  Stroke.  Abuse of alcohol, tobacco, or illegal drugs.  Depression.  Caffeine.  Neurological disorders, such as Alzheimer disease.  An overactive thyroid (hyperthyroidism). The cause of insomnia may not be known. What increases the risk? Risk factors for insomnia include:  Gender. Women are more commonly affected than men.  Age. Insomnia is more common as you get older.  Stress. This may involve your professional or personal life.  Income. Insomnia is more common in people with lower income.  Lack of exercise.  Irregular work schedule or night shifts.  Traveling between different time zones. What are the signs or symptoms? If you have insomnia, trouble falling asleep or trouble  staying asleep is the main symptom. This may lead to other symptoms, such as:  Feeling fatigued.  Feeling nervous about going to sleep.  Not feeling rested in the morning.  Having trouble concentrating.  Feeling irritable, anxious, or depressed. How is this treated? Treatment for insomnia depends on the cause. If your insomnia is caused by an underlying condition, treatment will focus on addressing the condition. Treatment may also include:  Medicines to help you sleep.  Counseling or therapy.  Lifestyle adjustments. Follow these instructions at home:  Take medicines only as directed by your health care provider.  Keep regular sleeping and waking hours. Avoid naps.  Keep a sleep diary to help you and your health care provider figure out what could be causing your insomnia. Include:  When you sleep.  When you wake up during the night.  How well you sleep.  How rested you feel the next day.  Any side effects of medicines you are taking.  What you eat and drink.  Make your bedroom a comfortable place where it is easy to fall asleep:  Put up shades or special blackout curtains to block light from outside.  Use a white noise machine to block noise.  Keep the temperature cool.  Exercise regularly as directed by your health care provider. Avoid exercising right before bedtime.  Use relaxation techniques to manage stress. Ask your health care provider to suggest some techniques that may work well for you. These may include:  Breathing exercises.  Routines to release muscle tension.  Visualizing peaceful scenes.  Cut back on alcohol, caffeinated beverages, and cigarettes, especially close to bedtime. These can disrupt your sleep.  Do not overeat or eat spicy foods right before bedtime. This can lead to digestive discomfort that can make it hard for you to sleep.  Limit screen use before bedtime. This includes:  Watching TV.  Using your smartphone, tablet, and  computer.  Stick to a routine. This can help you fall asleep faster. Try to do a quiet activity, brush your teeth, and go to bed at the same time each night.  Get out of bed if you are still awake after 15 minutes of trying to sleep. Keep the lights down, but try reading or doing a quiet activity. When you feel sleepy, go back to bed.  Make sure that you drive carefully. Avoid driving if you feel very sleepy.  Keep all follow-up appointments as directed by your health care provider. This is important. Contact a health care provider if:  You are tired throughout the day or have trouble in your daily routine due to sleepiness.  You continue to have sleep problems or your sleep problems get worse. Get help right away if:  You have serious thoughts about hurting yourself or someone else. This information is not intended to replace advice given to you by your health care provider. Make sure you discuss any questions you have with your health care provider. Document Released: 10/16/2000 Document Revised: 03/20/2016 Document Reviewed: 07/20/2014 Elsevier Interactive Patient Education  2017 [employer].

## 2016-12-13 ENCOUNTER — Encounter: Payer: Self-pay | Admitting: Family Medicine

## 2016-12-13 DIAGNOSIS — F5101 Primary insomnia: Secondary | ICD-10-CM | POA: Insufficient documentation

## 2016-12-13 DIAGNOSIS — G4700 Insomnia, unspecified: Secondary | ICD-10-CM | POA: Insufficient documentation

## 2016-12-13 NOTE — Progress Notes (Signed)
   Anthony Erickson.     MRN: [id number]      DOB: January 24, 1961   HPI Anthony Erickson is here for follow up and re-evaluation of chronic medical conditions, medication management and review of any available recent lab and radiology data.  Preventive health is updated, specifically  Cancer screening and Immunization.   Questions or concerns regarding consultations or procedures which the PT has had in the interim are  Addressed.Has recent rise in PSA and is now committed to ciprofloxacin to see if there is a response. The PT denies any adverse reactions to current medications since the last visit.  C/o recurrent difficulty with sleep and requests to be restarted on medication for this   ROS Denies recent fever or chills. Denies sinus pressure, nasal congestion, ear pain or sore throat. Denies chest congestion, productive cough or wheezing. Denies chest pains, palpitations and leg swelling Denies abdominal pain, nausea, vomiting,diarrhea or constipation.   Denies dysuria, frequency, hesitancy or incontinence. Denies joint pain, swelling and limitation in mobility. Denies headaches, seizures, numbness, or tingling.  Denies skin break down or rash.   PE  BP 120/74   Pulse (!) 104   Resp 15   Ht 5\' 6"  (1.676 m)   Wt 224 lb (101.6 kg)   SpO2 95%   BMI 36.15 kg/m   Patient alert and oriented and in no cardiopulmonary distress.  HEENT: No facial asymmetry, EOMI,   oropharynx pink and moist.  Neck supple no JVD, no mass.  Chest: Clear to auscultation bilaterally.  CVS: S1, S2 no murmurs, no S3.Regular rate.  ABD: Soft non tender.   Ext: No edema  MS: Adequate ROM spine, shoulders, hips and knees.  Skin: Intact, no ulcerations or rash noted.  Psych: Good eye contact, normal affect. Memory intact not anxious or depressed appearing.  CNS: CN 2-12 intact, power,  normal throughout.no focal deficits noted.   Assessment & Plan  Insomnia disorder Uncontrolled off medication Sleep  hygiene reviewed and written information offered also. Prescription sent for  medication needed.   Obesity Improved Patient re-educated about  the importance of commitment to a  minimum of 150 minutes of exercise per week.  The importance of healthy food choices with portion control discussed. Encouraged to start a food diary, count calories and to consider  joining a support group. Sample diet sheets offered. Goals set by the patient for the next several months.   Weight /BMI 12/07/2016 07/02/2016 06/10/2016  WEIGHT 224 lb 237 lb 233 lb  HEIGHT 5\' 6"  5\' 6"  5\' 6"   BMI 36.15 kg/m2 38.25 kg/m2 37.61 kg/m2      Adenocarcinoma of prostate followed by cancer center in [place], reports recent rise in PSA and is currently committed to course of ciprofloxacin to see effect on PSA level  Hyperlipidemia LDL goal <100 Hyperlipidemia:Low fat diet discussed and encouraged.   Lipid Panel  Lab Results  Component Value Date   CHOL 152 12/02/2015   HDL 38 (L) 12/02/2015   LDLCALC 90 12/02/2015   TRIG 120 12/02/2015   CHOLHDL 4.0 12/02/2015   Updated lab needed at/ before next visit.     Neck pain on right side Improved, on no medication and no plan to ave surgery

## 2016-12-13 NOTE — Assessment & Plan Note (Signed)
Improved Patient re-educated about  the importance of commitment to a  minimum of 150 minutes of exercise per week.  The importance of healthy food choices with portion control discussed. Encouraged to start a food diary, count calories and to consider  joining a support group. Sample diet sheets offered. Goals set by the patient for the next several months.   Weight /BMI 12/07/2016 07/02/2016 06/10/2016  WEIGHT 224 lb 237 lb 233 lb  HEIGHT 5\' 6"  5\' 6"  5\' 6"   BMI 36.15 kg/m2 38.25 kg/m2 37.61 kg/m2

## 2016-12-13 NOTE — Assessment & Plan Note (Signed)
Improved, on no medication and no plan to ave surgery

## 2016-12-13 NOTE — Assessment & Plan Note (Signed)
Hyperlipidemia:Low fat diet discussed and encouraged.   Lipid Panel  Lab Results  Component Value Date   CHOL 152 12/02/2015   HDL 38 (L) 12/02/2015   LDLCALC 90 12/02/2015   TRIG 120 12/02/2015   CHOLHDL 4.0 12/02/2015   Updated lab needed at/ before next visit.

## 2016-12-13 NOTE — Assessment & Plan Note (Signed)
Uncontrolled off medication Sleep hygiene reviewed and written information offered also. Prescription sent for  medication needed.

## 2016-12-13 NOTE — Assessment & Plan Note (Signed)
followed by cancer center in [place], reports recent rise in PSA and is currently committed to course of ciprofloxacin to see effect on PSA level

## 2016-12-15 DIAGNOSIS — M542 Cervicalgia: Secondary | ICD-10-CM | POA: Diagnosis not present

## 2016-12-25 MED FILL — TEMAZEPAM 15 MG CAPSULE: 15 | 45 days supply | Qty: 45 | Fill #0

## 2016-12-25 MED FILL — GABAPENTIN 400 MG CAPSULE: 400 | 30 days supply | Qty: 30 | Fill #0

## 2016-12-31 DIAGNOSIS — Z8546 Personal history of malignant neoplasm of prostate: Secondary | ICD-10-CM | POA: Diagnosis not present

## 2016-12-31 DIAGNOSIS — E785 Hyperlipidemia, unspecified: Secondary | ICD-10-CM | POA: Diagnosis not present

## 2016-12-31 LAB — CBC
HCT: 43.8 % (ref 38.5–50.0)
HEMOGLOBIN: 14.6 g/dL (ref 13.2–17.1)
MCH: 31.3 pg (ref 27.0–33.0)
MCHC: 33.3 g/dL (ref 32.0–36.0)
MCV: 93.8 fL (ref 80.0–100.0)
MPV: 11.2 fL (ref 7.5–12.5)
Platelets: 215 10*3/uL (ref 140–400)
RBC: 4.67 MIL/uL (ref 4.20–5.80)
RDW: 13.5 % (ref 11.0–15.0)
WBC: 4.5 10*3/uL (ref 3.8–10.8)

## 2017-01-01 ENCOUNTER — Encounter: Payer: Self-pay | Admitting: Family Medicine

## 2017-01-01 ENCOUNTER — Other Ambulatory Visit: Payer: Self-pay

## 2017-01-01 ENCOUNTER — Other Ambulatory Visit: Payer: Self-pay | Admitting: Family Medicine

## 2017-01-01 LAB — COMPREHENSIVE METABOLIC PANEL
ALT: 25 U/L (ref 9–46)
AST: 21 U/L (ref 10–35)
Albumin: 4.2 g/dL (ref 3.6–5.1)
Alkaline Phosphatase: 65 U/L (ref 40–115)
BILIRUBIN TOTAL: 0.7 mg/dL (ref 0.2–1.2)
BUN: 17 mg/dL (ref 7–25)
CO2: 28 mmol/L (ref 20–31)
CREATININE: 1.02 mg/dL (ref 0.70–1.33)
Calcium: 9.1 mg/dL (ref 8.6–10.3)
Chloride: 106 mmol/L (ref 98–110)
GLUCOSE: 101 mg/dL — AB (ref 65–99)
Potassium: 4.4 mmol/L (ref 3.5–5.3)
SODIUM: 141 mmol/L (ref 135–146)
Total Protein: 7.1 g/dL (ref 6.1–8.1)

## 2017-01-01 LAB — LIPID PANEL
CHOL/HDL RATIO: 3.8 ratio (ref <5.0)
Cholesterol: 142 mg/dL (ref <200)
HDL: 37 mg/dL — ABNORMAL LOW (ref >40)
LDL Cholesterol: 86 mg/dL (ref <100)
Triglycerides: 93 mg/dL (ref <150)
VLDL: 19 mg/dL (ref <30)

## 2017-01-01 LAB — TSH: TSH: 1.11 m[IU]/L (ref 0.40–4.50)

## 2017-01-01 LAB — VITAMIN D 25 HYDROXY (VIT D DEFICIENCY, FRACTURES): Vit D, 25-Hydroxy: 20 ng/mL — ABNORMAL LOW (ref 30–100)

## 2017-01-01 MED ORDER — ERGOCALCIFEROL 1.25 MG (50000 UT) PO CAPS: 50000.0000 [IU] | ORAL_CAPSULE | ORAL | 1 refills | Status: DC

## 2017-01-15 ENCOUNTER — Telehealth: Payer: Self-pay | Admitting: Family Medicine

## 2017-01-15 NOTE — Telephone Encounter (Signed)
Mailed results to him also

## 2017-01-15 NOTE — Telephone Encounter (Signed)
Pt did not read his e msg re lab results and needs to start weekly vit D pls review msg with him, thanks

## 2017-02-07 ENCOUNTER — Emergency Department (HOSPITAL_COMMUNITY): Payer: 59

## 2017-02-07 ENCOUNTER — Emergency Department (HOSPITAL_COMMUNITY)
Admission: EM | Admit: 2017-02-07 | Discharge: 2017-02-07 | Disposition: A | Discharge status: Home / Self Care | Payer: 59 | Attending: Emergency Medicine | Admitting: Emergency Medicine

## 2017-02-07 ENCOUNTER — Encounter (HOSPITAL_COMMUNITY): Payer: Self-pay | Admitting: Emergency Medicine

## 2017-02-07 DIAGNOSIS — Z79899 Other long term (current) drug therapy: Secondary | ICD-10-CM | POA: Diagnosis not present

## 2017-02-07 DIAGNOSIS — Z8546 Personal history of malignant neoplasm of prostate: Secondary | ICD-10-CM | POA: Diagnosis not present

## 2017-02-07 DIAGNOSIS — Z859 Personal history of malignant neoplasm, unspecified: Secondary | ICD-10-CM | POA: Insufficient documentation

## 2017-02-07 DIAGNOSIS — M545 Low back pain, unspecified: Secondary | ICD-10-CM

## 2017-02-07 MED ORDER — DICLOFENAC SODIUM 50 MG PO TBEC: 50.0000 mg | DELAYED_RELEASE_TABLET | Freq: Two times a day (BID) | ORAL | 0 refills | Status: DC

## 2017-02-07 NOTE — ED Triage Notes (Addendum)
Pt c/o right mid lateral back pain, worse with movement. No urinary symptoms, hematuria, nausea, emesis. No CVAT.

## 2017-02-07 NOTE — Discharge Instructions (Signed)
Please read attached information. If you experience any new or worsening signs or symptoms please return to the emergency room for evaluation. Please follow-up with your primary care provider or specialist as discussed. Please use medication prescribed only as directed and discontinue taking if you have any concerning signs or symptoms.   °

## 2017-02-07 NOTE — ED Notes (Signed)
Discharge instructions, follow up care, and rx x1 reviewed with patient. Patient verbalized understanding. 

## 2017-02-07 NOTE — ED Provider Notes (Signed)
[facility] DEPT Provider Note   CSN: [id number] Arrival date & time: 02/07/17  1332 By signing my name below, I, Dyke Brackett, attest that this documentation has been prepared under the direction and in the presence of non-physician practitioner, Okey Regal, PA-C. Electronically Signed: Dyke Brackett, Scribe. 02/07/2017. 2:54 PM.   History   Chief Complaint Chief Complaint  Patient presents with  . Back Pain   HPI Anthony Erickson. is a 56 y.o. male with a history of low back pain who presents to the Emergency Department complaining of sudden onset, constant, moderate mid-right lateral back pain onset two days ago. Per pt, he woke up two days ago and noticed the pain. He states the pain worsened some yesterday, but has improved today. Pain is worsened by movement and is unchanged by direct palpation. No OTC treatments tried for these symptoms PTA. Pt is currently employed as a Training and development officer. No recent fall or injury. He denies any weakness, numbness, tingling, abdominal pain, nausea or vomiting. No hx of kidney stones. No urinary changes.   The history is provided by the patient. No language interpreter was used.   Past Medical History:  Diagnosis Date  . Cancer ([place])   . Condyloma acuminatum   . GERD (gastroesophageal reflux disease)   . Hyperlipidemia   . Obesity   . Prostate cancer ([place])   . Tendinitis    Of left hand    Patient Active Problem List   Diagnosis Date Noted  . Insomnia disorder 12/13/2016  . Neck pain on right side 05/18/2016  . Low back pain without sciatica 05/18/2016  . Seasonal allergies 11/22/2014  . Adenocarcinoma of prostate ([place]) 08/27/2011  . VISUAL ACUITY, DECREASED 09/17/2008  . Hyperlipidemia LDL goal <100 05/02/2008  . Obesity 05/02/2008  . GERD 05/02/2008    Past Surgical History:  Procedure Laterality Date  . APPENDECTOMY  12/13/2009  . CHEILECTOMY Right 01/24/2014   Procedure: CHEILECTOMY;  Surgeon: Carole Civil, MD;  Location: [facility];  Service: Orthopedics;  Laterality: Right;  . CHOLECYSTECTOMY  08/15/2010   Dr. Cornelia Copa  . ctr right hand      Home Medications    Prior to Admission medications   Medication Sig Start Date End Date Taking? Authorizing Provider  ciprofloxacin (CIPRO) 250 MG tablet Take 1 tablet (250 mg total) by mouth 2 (two) times daily. 12/07/16   Fayrene Helper, MD  diclofenac (VOLTAREN) 50 MG EC tablet Take 1 tablet (50 mg total) by mouth 2 (two) times daily. 02/07/17   Okey Regal, PA-C  ergocalciferol (VITAMIN D2) 50000 units capsule Take 1 capsule (50,000 Units total) by mouth once a week. One capsule once weekly 01/01/17   Fayrene Helper, MD  Nyoka Cowden Tea, Camillia sinensis, (GREEN TEA EXTRACT PO) Take 1 capsule by mouth daily.    Historical Provider, MD  Multiple Vitamin (MULTI VITAMIN MENS PO) Take 1 tablet by mouth daily.     Historical Provider, MD  temazepam (RESTORIL) 15 MG capsule Take 1 capsule (15 mg total) by mouth at bedtime. 12/07/16   Fayrene Helper, MD   Family History Family History  Problem Relation Age of Onset  . Hypertension Mother   . Diabetes Mother   . Hyperlipidemia Mother   . GER disease Father   . Hypertension Father   . Hyperlipidemia Father   . Diabetes Sister   . Hypertension Sister   . Cancer Sister     ovarian   . Diabetes  Family History   . Cancer      Family History     Social History Social History  Substance Use Topics  . Smoking status: Never Smoker  . Smokeless tobacco: Never Used  . Alcohol use No     Comment: Occasional     Allergies   Patient has no known allergies.  Review of Systems Review of Systems 10 systems reviewed and are negative for acute change except as noted in the HPI.  Physical Exam Updated Vital Signs BP 128/84 (BP Location: Right Arm)   Pulse 86   Temp 98.5 F (36.9 C) (Oral)   Resp 18   Ht 5\' 6"  (1.676 m)   Wt 101.8 kg   SpO2 97%   BMI 36.24 kg/m   Physical Exam  Constitutional: He is  oriented to person, place, and time. He appears well-developed and well-nourished. No distress.  HENT:  Head: Normocephalic and atraumatic.  Eyes: Conjunctivae are normal.  Cardiovascular: Normal rate.   Pulmonary/Chest: Effort normal.  Abdominal: He exhibits no distension.  Musculoskeletal:  NTTP of CTL spine or remainder of back, no CVA tenderness   Distal sensation strength and motor function intact   Neurological: He is alert and oriented to person, place, and time.  Skin: Skin is warm and dry.  Psychiatric: He has a normal mood and affect.  Nursing note and vitals reviewed.  ED Treatments / Results  DIAGNOSTIC STUDIES:  Oxygen Saturation is 100% on RA, normal by my interpretation.    COORDINATION OF CARE:  2:54 PM Will order imaging. Discussed treatment plan with pt at bedside and pt agreed to plan.   Labs (all labs ordered are listed, but only abnormal results are displayed) Labs Reviewed - No data to display  EKG  EKG Interpretation None       Radiology Dg Lumbar Spine Complete  Result Date: 02/07/2017 CLINICAL DATA:  Back pain for 3 days. EXAM: LUMBAR SPINE - COMPLETE 4+ VIEW COMPARISON:  05/15/2016 FINDINGS: Moderate degenerative lumbar spondylosis with multilevel disc disease and facet disease. This appears relatively stable. No acute bony findings or destructive bony changes. The visualized bony pelvis is intact. IMPRESSION: Degenerative lumbar spondylosis with multilevel disc disease and facet disease not significantly change when compared to prior examination. No acute bony findings. Electronically Signed   By: Marijo Sanes M.D.   On: 02/07/2017 15:29    Procedures Procedures (including critical care time)  Medications Ordered in ED Medications - No data to display   Initial Impression / Assessment and Plan / ED Course  I have reviewed the triage vital signs and the nursing notes.  Pertinent labs & imaging results that were available during my care of  the patient were reviewed by me and considered in my medical decision making (see chart for details).      Final Clinical Impressions(s) / ED Diagnoses   Final diagnoses:  Acute right-sided low back pain without sciatica    56 year old male presents today with complaints of right lower back pain.  Patients pain worsened with movement, nontender palpation no infectious etiology.  He has no abdominal pain, urinary changes, or acute concerns for anterior abdominal pathology.  Patient will be discharged home with symptomatic care instructions and primary care follow-up.  He is given strict return precautions.  He verbalized understanding and agreement to today's plan had no further questions or concerns  New Prescriptions Discharge Medication List as of 02/07/2017  4:18 PM    START taking these medications  Details  diclofenac (VOLTAREN) 50 MG EC tablet Take 1 tablet (50 mg total) by mouth 2 (two) times daily., Starting Sun 02/07/2017, Print       I personally performed the services described in this documentation, which was scribed in my presence. The recorded information has been reviewed and is accurate.   Okey Regal, PA-C 02/07/17 [place], PA-C 02/07/17 [place], MD 02/08/17 [phone]

## 2017-02-10 DIAGNOSIS — M545 Low back pain: Secondary | ICD-10-CM | POA: Diagnosis not present

## 2017-02-10 MED FILL — METHYLPREDNISOLONE 4 MG TAB: 4 | 6 days supply | Qty: 21 | Fill #0

## 2017-02-10 MED FILL — OMEPRAZOLE DR 20 MG CAPSULE: 20 | 30 days supply | Qty: 30 | Fill #0

## 2017-02-25 MED FILL — GABAPENTIN 400 MG CAPSULE: 400 | 30 days supply | Qty: 30 | Fill #1

## 2017-03-24 DIAGNOSIS — M545 Low back pain: Secondary | ICD-10-CM | POA: Diagnosis not present

## 2017-04-16 DIAGNOSIS — M545 Low back pain: Secondary | ICD-10-CM | POA: Diagnosis not present

## 2017-04-20 DIAGNOSIS — M545 Low back pain: Secondary | ICD-10-CM | POA: Diagnosis not present

## 2017-04-20 DIAGNOSIS — M48061 Spinal stenosis, lumbar region without neurogenic claudication: Secondary | ICD-10-CM | POA: Diagnosis not present

## 2017-04-20 MED FILL — DICLOFENAC SOD 75 MG TAB EC: 75 | 30 days supply | Qty: 60 | Fill #0

## 2017-04-20 MED FILL — OMEPRAZOLE DR 20 MG CAPSULE: 20 | 30 days supply | Qty: 30 | Fill #0

## 2017-05-03 DIAGNOSIS — M545 Low back pain: Secondary | ICD-10-CM | POA: Diagnosis not present

## 2017-05-03 DIAGNOSIS — M48061 Spinal stenosis, lumbar region without neurogenic claudication: Secondary | ICD-10-CM | POA: Diagnosis not present

## 2017-05-06 ENCOUNTER — Encounter: Payer: Self-pay | Admitting: Family Medicine

## 2017-05-06 ENCOUNTER — Ambulatory Visit (INDEPENDENT_AMBULATORY_CARE_PROVIDER_SITE_OTHER): Payer: 59 | Admitting: Family Medicine

## 2017-05-06 VITALS — BP 116/84 | HR 90 | Temp 98.3°F | Ht 66.0 in | Wt 231.1 lb

## 2017-05-06 DIAGNOSIS — H543 Unqualified visual loss, both eyes: Secondary | ICD-10-CM | POA: Insufficient documentation

## 2017-05-06 DIAGNOSIS — Z1211 Encounter for screening for malignant neoplasm of colon: Secondary | ICD-10-CM

## 2017-05-06 DIAGNOSIS — Z0000 Encounter for general adult medical examination without abnormal findings: Secondary | ICD-10-CM | POA: Insufficient documentation

## 2017-05-06 DIAGNOSIS — R972 Elevated prostate specific antigen [PSA]: Secondary | ICD-10-CM | POA: Diagnosis not present

## 2017-05-06 NOTE — Assessment & Plan Note (Signed)

## 2017-05-06 NOTE — Progress Notes (Signed)
   Lymon Emi Belfast.     MRN: [id number]      DOB: 09/10/1961   HPI: Patient is in for annual physical exam. No other health concerns are expressed or addressed at the visit. Recent labs, if available are reviewed. Immunization is reviewed , and  updated if needed.    PE; BP 116/84 (BP Location: Right Arm, Patient Position: Sitting, Cuff Size: Normal)   Pulse 90   Temp 98.3 F (36.8 C)   Ht 5\' 6"  (1.676 m)   Wt 231 lb 1.9 oz (104.8 kg)   SpO2 98%   BMI 37.30 kg/m   Pleasant male, alert and oriented x 3, in no cardio-pulmonary distress. Afebrile. HEENT No facial trauma or asymetry. Sinuses non tender. EOMI, pupils equally reactive to light. External ears normal, tympanic membranes clear. Oropharynx moist, no exudate. Neck: supple, no adenopathy,JVD or thyromegaly.No bruits.  Chest: Clear to ascultation bilaterally.No crackles or wheezes. Non tender to palpation  Breast: No asymetry,no masses. No nipple discharge or inversion. No axillary or supraclavicular adenopathy  Cardiovascular system; Heart sounds normal,  S1 and  S2 ,no S3.  No murmur, or thrill. Apical beat not displaced Peripheral pulses normal.  Abdomen: Soft, non tender, no organomegaly or masses. No bruits. Bowel sounds normal. No guarding, tenderness or rebound.  Rectal:  Normal sphincter tone. No hemorrhoids or  masses. guaiac negative stool. Prostate smooth and firm    Musculoskeletal exam: Full ROM of spine, hips , shoulders and knees. No deformity ,swelling or crepitus noted. No muscle wasting or atrophy.   Neurologic: Cranial nerves 2 to 12 intact. Power, tone ,sensation and reflexes normal throughout. No disturbance in gait. No tremor.  Skin: Intact, no ulceration, erythema , scaling or rash noted. Pigmentation normal throughout  Psych; Normal mood and affect. Judgement and concentration normal   Assessment & Plan:  No problem-specific Assessment & Plan notes found for  this encounter.

## 2017-05-06 NOTE — Patient Instructions (Addendum)
F/u in 5 months, call if you need me before  It is important that you exercise regularly at least 30 minutes 5 times a week. If you develop chest pain, have severe difficulty breathing, or feel very tired, stop exercising immediately and seek medical attention   Please work on good  health habits so that your health will improve. 1. Commitment to daily physical activity for 30 to 60  minutes, if you are able to do this.  2. Commitment to wise food choices. Aim for half of your  food intake to be vegetable and fruit, one quarter starchy foods, and one quarter protein. Try to eat on a regular schedule  3 meals per day, snacking between meals should be limited to vegetables or fruits or small portions of nuts. 64 ounces of water per day is generally recommended, unless you have specific health conditions, like heart failure or kidney failure where you will need to limit fluid intake.  3. Commitment to sufficient and a  good quality of physical and mental rest daily, generally between 6 to 8 hours per day.  WITH PERSISTANCE AND PERSEVERANCE, THE IMPOSSIBLE , BECOMES THE NORM!   Thank you  for choosing [facility]. We consider it a privelige to serve you.  Delivering excellent health care in a caring and  compassionate way is our goal.  Partnering with you,  so that together we can achieve this goal is our strategy.

## 2017-05-06 NOTE — Assessment & Plan Note (Signed)
Rectal exam: heme negative stool

## 2017-05-07 ENCOUNTER — Telehealth: Payer: Self-pay

## 2017-05-07 LAB — POC HEMOCCULT BLD/STL (OFFICE/1-CARD/DIAGNOSTIC)
Card #1 Date: 7
Fecal Occult Blood, POC: NEGATIVE

## 2017-05-07 NOTE — Telephone Encounter (Signed)
-----   Message from Christia Reading, LPN sent at 02/05/8591  8:03 AM EDT ----- Regarding: FW: pls doc fob from 7/5 visit, tx! This was negative, since I do not have the card information in front of me, would you please document this result in his chart for me?  ----- Message ----- From: Fayrene Helper, MD Sent: 05/06/2017   6:11 PM To: Fayrene Helper, MD, Christia Reading, LPN Subject: pls doc fob from 7/5 visit, tx!

## 2017-05-07 NOTE — Addendum Note (Signed)
Addended by: Ova Freshwater on: 05/07/2017 04:29 PM   Modules accepted: Orders

## 2017-05-14 ENCOUNTER — Ambulatory Visit (INDEPENDENT_AMBULATORY_CARE_PROVIDER_SITE_OTHER): Payer: 59 | Admitting: Urology

## 2017-05-14 DIAGNOSIS — Z8546 Personal history of malignant neoplasm of prostate: Secondary | ICD-10-CM | POA: Diagnosis not present

## 2017-05-14 DIAGNOSIS — R972 Elevated prostate specific antigen [PSA]: Secondary | ICD-10-CM | POA: Diagnosis not present

## 2017-05-18 DIAGNOSIS — M48061 Spinal stenosis, lumbar region without neurogenic claudication: Secondary | ICD-10-CM | POA: Diagnosis not present

## 2017-05-18 DIAGNOSIS — M545 Low back pain: Secondary | ICD-10-CM | POA: Diagnosis not present

## 2017-05-31 DIAGNOSIS — M48061 Spinal stenosis, lumbar region without neurogenic claudication: Secondary | ICD-10-CM | POA: Diagnosis not present

## 2017-05-31 DIAGNOSIS — M545 Low back pain: Secondary | ICD-10-CM | POA: Diagnosis not present

## 2017-06-08 DIAGNOSIS — H5203 Hypermetropia, bilateral: Secondary | ICD-10-CM | POA: Diagnosis not present

## 2017-06-08 DIAGNOSIS — H52203 Unspecified astigmatism, bilateral: Secondary | ICD-10-CM | POA: Diagnosis not present

## 2017-06-08 DIAGNOSIS — H524 Presbyopia: Secondary | ICD-10-CM | POA: Diagnosis not present

## 2017-06-08 DIAGNOSIS — H2513 Age-related nuclear cataract, bilateral: Secondary | ICD-10-CM | POA: Diagnosis not present

## 2017-06-21 ENCOUNTER — Other Ambulatory Visit: Payer: Self-pay | Admitting: Family Medicine

## 2017-06-21 NOTE — Telephone Encounter (Signed)
Seen 7 [phone] 18 

## 2017-08-18 ENCOUNTER — Telehealth: Payer: Self-pay | Admitting: Family Medicine

## 2017-08-18 MED ORDER — GABAPENTIN 400 MG PO CAPS: ORAL_CAPSULE | ORAL | 1 refills | Status: DC

## 2017-08-18 MED ORDER — TEMAZEPAM 15 MG PO CAPS: 15.0000 mg | ORAL_CAPSULE | Freq: Every day | ORAL | 4 refills | Status: DC

## 2017-08-18 NOTE — Telephone Encounter (Signed)
Patient uses the outpatient pharmacy at [facility] and he picks up from [facility].  He is requesting a refill on Gabapentin and Temazepam.  He has not heard back from the pharmacy or our office.  Pt is completely out.   cb  [phone]

## 2017-10-06 ENCOUNTER — Encounter: Payer: Self-pay | Admitting: Family Medicine

## 2017-10-06 ENCOUNTER — Ambulatory Visit (INDEPENDENT_AMBULATORY_CARE_PROVIDER_SITE_OTHER): Payer: 59 | Admitting: Family Medicine

## 2017-10-06 VITALS — BP 118/82 | Resp 16 | Ht 66.0 in | Wt 231.4 lb

## 2017-10-06 DIAGNOSIS — E785 Hyperlipidemia, unspecified: Secondary | ICD-10-CM

## 2017-10-06 DIAGNOSIS — E669 Obesity, unspecified: Secondary | ICD-10-CM

## 2017-10-06 DIAGNOSIS — K219 Gastro-esophageal reflux disease without esophagitis: Secondary | ICD-10-CM

## 2017-10-06 DIAGNOSIS — E559 Vitamin D deficiency, unspecified: Secondary | ICD-10-CM | POA: Diagnosis not present

## 2017-10-06 DIAGNOSIS — J302 Other seasonal allergic rhinitis: Secondary | ICD-10-CM

## 2017-10-06 DIAGNOSIS — C61 Malignant neoplasm of prostate: Secondary | ICD-10-CM

## 2017-10-06 DIAGNOSIS — F5101 Primary insomnia: Secondary | ICD-10-CM

## 2017-10-06 DIAGNOSIS — R7301 Impaired fasting glucose: Secondary | ICD-10-CM

## 2017-10-06 MED ORDER — GABAPENTIN 400 MG PO CAPS: ORAL_CAPSULE | ORAL | 1 refills | Status: DC

## 2017-10-06 MED ORDER — PANTOPRAZOLE SODIUM 40 MG PO TBEC: DELAYED_RELEASE_TABLET | ORAL | 3 refills | Status: DC

## 2017-10-06 MED ORDER — TEMAZEPAM 15 MG PO CAPS: 15.0000 mg | ORAL_CAPSULE | Freq: Every day | ORAL | 1 refills | Status: DC

## 2017-10-06 MED FILL — PANTOPRAZOLE SOD DR 40 MG T: 40 | 60 days supply | Qty: 60 | Fill #0

## 2017-10-06 NOTE — Patient Instructions (Addendum)
F/U first week in April, call if you need me before  Weight loss goal of 10 pounds   CBC, fasting lipid, chem 7, hBA1C, vit D and TSH last week in March  Do not eat less than 2 hours before lying down, and no caffeine to reduce reflux  Three meds sent to [facility], protonix, gabapentin and  temazepam  It is important that you exercise regularly at least 30 minutes 5 times a week. If you develop chest pain, have severe difficulty breathing, or feel very tired, stop exercising immediately and seek medical attention    Please work on good  health habits so that your health will improve. 1. Commitment to daily physical activity for 30 to 60  minutes, if you are able to do this.  2. Commitment to wise food choices. Aim for half of your  food intake to be vegetable and fruit, one quarter starchy foods, and one quarter protein. Try to eat on a regular schedule  3 meals per day, snacking between meals should be limited to vegetables or fruits or small portions of nuts. 64 ounces of water per day is generally recommended, unless you have specific health conditions, like heart failure or kidney failure where you will need to limit fluid intake.  3. Commitment to sufficient and a  good quality of physical and mental rest daily, generally between 6 to 8 hours per day.  WITH PERSISTANCE AND PERSEVERANCE, THE IMPOSSIBLE , BECOMES THE NORM!

## 2017-10-06 NOTE — Progress Notes (Signed)
   Anthony Erickson.     MRN: [id number]      DOB: Nov 26, 1960   HPI Mr. Anthony Erickson is here for follow up and re-evaluation of chronic medical conditions, medication management and review of any available recent lab and radiology data.  Preventive health is updated, specifically  Cancer screening and Immunization.   Questions or concerns regarding consultations or procedures which the PT has had in the interim are  addressed. The PT denies any adverse reactions to current medications since the last visit.  Has been out of medication for reflux and sleep and is suffering unfortunately, as a result C/o intermittent cramping low back pain radiating own legs , has established disc disease, followed , followed by Dr Percell Slowey 1 week h/o intermittent stomach pain and bloating  ROS Denies recent fever or chills. Denies sinus pressure, nasal congestion, ear pain or sore throat. Denies chest congestion, productive cough or wheezing. Denies chest pains, palpitations and leg swelling Denies diarrhea or constipation.   Denies dysuria, frequency, hesitancy or incontinence. . Denies headaches, seizures, numbness, or tingling. Denies depression or  anxiety  Denies skin break down or rash.   PE  BP 118/82   Resp 16   Ht 5\' 6"  (1.676 m)   Wt 231 lb 6.4 oz (105 kg)   SpO2 94%   BMI 37.35 kg/m   Patient alert and oriented and in no cardiopulmonary distress.  HEENT: No facial asymmetry, EOMI,   oropharynx pink and moist.  Neck supple no JVD, no mass.  Chest: Clear to auscultation bilaterally.  CVS: S1, S2 no murmurs, no S3.Regular rate.  ABD: Soft no localized tenderness , guarding or rebound Ext: No edema  MS: Decreased  ROM lumbar  spine, adequate in shoulders, hips and knees.  Skin: Intact, no ulcerations or rash noted.  Psych: Good eye contact, normal affect. Memory intact not anxious or depressed appearing.  CNS: CN 2-12 intact, power,  normal throughout.no focal deficits  noted.   Assessment & Plan Insomnia disorder Sleep hygiene reviewed and written information offered also. Prescription sent for  medication needed.   Dyslipidemia Hyperlipidemia:Low fat diet discussed and encouraged.   Lipid Panel  Lab Results  Component Value Date   CHOL 142 12/31/2016   HDL 37 (L) 12/31/2016   LDLCALC 86 12/31/2016   TRIG 93 12/31/2016   CHOLHDL 3.8 12/31/2016    Needs to increase exercise commitment  Updated lab needed at/ before next visit.    GERD Uncontrolled, stop caffeine , work on weight loss and resume medication  Low back pain without sciatica Chronic and unchanged, gabapentin controls his pain and he will work on weight loss and back strengthening exercise  Adenocarcinoma of prostate In remission, followed locally   Obesity (BMI 35.0-39.9 without comorbidity) Deteriorated. Patient re-educated about  the importance of commitment to a  minimum of 150 minutes of exercise per week.  The importance of healthy food choices with portion control discussed. Encouraged to start a food diary, count calories and to consider  joining a support group. Sample diet sheets offered. Goals set by the patient for the next several months.   Weight /BMI 10/06/2017 05/06/2017 02/07/2017  WEIGHT 231 lb 6.4 oz 231 lb 1.9 oz 224 lb 8 oz  HEIGHT 5\' 6"  5\' 6"  5\' 6"   BMI 37.35 kg/m2 37.3 kg/m2 36.24 kg/m2      Seasonal allergies No current flare

## 2017-10-10 ENCOUNTER — Encounter: Payer: Self-pay | Admitting: Family Medicine

## 2017-10-10 NOTE — Assessment & Plan Note (Signed)
Sleep hygiene reviewed and written information offered also. Prescription sent for  medication needed.  

## 2017-10-10 NOTE — Assessment & Plan Note (Signed)
Uncontrolled, stop caffeine , work on weight loss and resume medication

## 2017-10-10 NOTE — Assessment & Plan Note (Signed)
Chronic and unchanged, gabapentin controls his pain and he will work on weight loss and back strengthening exercise

## 2017-10-10 NOTE — Assessment & Plan Note (Signed)
Deteriorated. Patient re-educated about  the importance of commitment to a  minimum of 150 minutes of exercise per week.  The importance of healthy food choices with portion control discussed. Encouraged to start a food diary, count calories and to consider  joining a support group. Sample diet sheets offered. Goals set by the patient for the next several months.   Weight /BMI 10/06/2017 05/06/2017 02/07/2017  WEIGHT 231 lb 6.4 oz 231 lb 1.9 oz 224 lb 8 oz  HEIGHT 5\' 6"  5\' 6"  5\' 6"   BMI 37.35 kg/m2 37.3 kg/m2 36.24 kg/m2

## 2017-10-10 NOTE — Assessment & Plan Note (Signed)
No current flare 

## 2017-10-10 NOTE — Assessment & Plan Note (Signed)
In remission, followed locally

## 2017-10-10 NOTE — Assessment & Plan Note (Signed)
Hyperlipidemia:Low fat diet discussed and encouraged.   Lipid Panel  Lab Results  Component Value Date   CHOL 142 12/31/2016   HDL 37 (L) 12/31/2016   LDLCALC 86 12/31/2016   TRIG 93 12/31/2016   CHOLHDL 3.8 12/31/2016    Needs to increase exercise commitment  Updated lab needed at/ before next visit.

## 2017-10-12 ENCOUNTER — Emergency Department (HOSPITAL_COMMUNITY): Payer: 59

## 2017-10-12 ENCOUNTER — Other Ambulatory Visit: Payer: Self-pay

## 2017-10-12 ENCOUNTER — Encounter (HOSPITAL_COMMUNITY): Payer: Self-pay | Admitting: Emergency Medicine

## 2017-10-12 ENCOUNTER — Emergency Department (HOSPITAL_COMMUNITY)
Admission: EM | Admit: 2017-10-12 | Discharge: 2017-10-12 | Disposition: A | Discharge status: Home / Self Care | Payer: 59 | Attending: Emergency Medicine | Admitting: Emergency Medicine

## 2017-10-12 DIAGNOSIS — R1031 Right lower quadrant pain: Secondary | ICD-10-CM | POA: Insufficient documentation

## 2017-10-12 DIAGNOSIS — Z8546 Personal history of malignant neoplasm of prostate: Secondary | ICD-10-CM | POA: Diagnosis not present

## 2017-10-12 DIAGNOSIS — K5900 Constipation, unspecified: Secondary | ICD-10-CM | POA: Insufficient documentation

## 2017-10-12 DIAGNOSIS — Z79899 Other long term (current) drug therapy: Secondary | ICD-10-CM | POA: Insufficient documentation

## 2017-10-12 DIAGNOSIS — R1011 Right upper quadrant pain: Secondary | ICD-10-CM | POA: Diagnosis not present

## 2017-10-12 DIAGNOSIS — R109 Unspecified abdominal pain: Secondary | ICD-10-CM

## 2017-10-12 DIAGNOSIS — R1030 Lower abdominal pain, unspecified: Secondary | ICD-10-CM | POA: Diagnosis not present

## 2017-10-12 LAB — COMPREHENSIVE METABOLIC PANEL
ALK PHOS: 63 U/L (ref 38–126)
ALT: 25 U/L (ref 17–63)
ANION GAP: 7 (ref 5–15)
AST: 24 U/L (ref 15–41)
Albumin: 4.1 g/dL (ref 3.5–5.0)
BILIRUBIN TOTAL: 1 mg/dL (ref 0.3–1.2)
BUN: 17 mg/dL (ref 6–20)
CALCIUM: 9.1 mg/dL (ref 8.9–10.3)
CO2: 26 mmol/L (ref 22–32)
CREATININE: 1.2 mg/dL (ref 0.61–1.24)
Chloride: 105 mmol/L (ref 101–111)
Glucose, Bld: 116 mg/dL — ABNORMAL HIGH (ref 65–99)
Potassium: 3.6 mmol/L (ref 3.5–5.1)
SODIUM: 138 mmol/L (ref 135–145)
TOTAL PROTEIN: 7.1 g/dL (ref 6.5–8.1)

## 2017-10-12 LAB — CBC WITH DIFFERENTIAL/PLATELET
BASOS ABS: 0 10*3/uL (ref 0.0–0.1)
BASOS PCT: 0 %
EOS ABS: 0 10*3/uL (ref 0.0–0.7)
EOS PCT: 1 %
HCT: 45.2 % (ref 39.0–52.0)
HEMOGLOBIN: 14.8 g/dL (ref 13.0–17.0)
LYMPHS ABS: 3.3 10*3/uL (ref 0.7–4.0)
Lymphocytes Relative: 59 %
MCH: 31.6 pg (ref 26.0–34.0)
MCHC: 32.7 g/dL (ref 30.0–36.0)
MCV: 96.6 fL (ref 78.0–100.0)
Monocytes Absolute: 0.5 10*3/uL (ref 0.1–1.0)
Monocytes Relative: 9 %
NEUTROS PCT: 31 %
Neutro Abs: 1.7 10*3/uL (ref 1.7–7.7)
PLATELETS: 204 10*3/uL (ref 150–400)
RBC: 4.68 MIL/uL (ref 4.22–5.81)
RDW: 13.2 % (ref 11.5–15.5)
WBC: 5.5 10*3/uL (ref 4.0–10.5)

## 2017-10-12 LAB — LIPASE, BLOOD: LIPASE: 24 U/L (ref 11–51)

## 2017-10-12 MED ORDER — MAGNESIUM HYDROXIDE 400 MG/5ML PO SUSP
30.0000 mL | Freq: Once | ORAL | Status: AC
  Administered 2017-10-12: 30 mL via ORAL
  Filled 2017-10-12: qty 30

## 2017-10-12 MED ORDER — IOPAMIDOL (ISOVUE-300) INJECTION 61%
100.0000 mL | Freq: Once | INTRAVENOUS | Status: AC | PRN
  Administered 2017-10-12: 100 mL via INTRAVENOUS

## 2017-10-12 NOTE — ED Provider Notes (Signed)
[facility] EMERGENCY DEPARTMENT Provider Note   CSN: [id number] Arrival date & time: 10/12/17  0907     History   Chief Complaint Chief Complaint  Patient presents with  . Abdominal Pain    HPI Anthony Erickson is a 56 y.o. male.  Patient is a 56 year old male who presents to the emergency department with a complaint of lower abdomen pain.  The patient states this problem has been going on for nearly 2 weeks.  He has been seen by his primary physician.  It was determined that this was probably related to a GERD related problem.  The patient was treated with a proton pump inhibitor as well as a antacid medication.  Patient states he is not been taking this medication recently.  He states that this morning approximately 4 AM he was awakened with a sharp and burning type pain in his lower abdomen between his bellybutton and his pubis.  The patient states the pain was about a 5 on a 1-10 scale.  The pain has mostly resolved now since he has been in the emergency department.  It was not accompanied by any vomiting, or with any diarrhea.  He is not had any high fever or chills to be reported.  No recent injury or trauma to the abdomen.  No recent operations or procedures.  There is been no recent changes in his stool.  No blood noted in the stool and no blood noted in the urine.  He is not had any recent changes in his diet.  And he is not had any recent changes in his medications.   The history is provided by the patient.  Abdominal Pain   Pertinent negatives include fever, diarrhea, vomiting, dysuria, frequency, hematuria and arthralgias.    Past Medical History:  Diagnosis Date  . Cancer ([place])   . Condyloma acuminatum   . GERD (gastroesophageal reflux disease)   . Hyperlipidemia   . Obesity   . Prostate cancer ([place])   . Tendinitis    Of left hand     Patient Active Problem List   Diagnosis Date Noted  . Vision loss, bilateral 05/06/2017  . Insomnia disorder 12/13/2016  .  Neck pain on right side 05/18/2016  . Low back pain without sciatica 05/18/2016  . Seasonal allergies 11/22/2014  . Adenocarcinoma of prostate ([place]) 08/27/2011  . VISUAL ACUITY, DECREASED 09/17/2008  . Dyslipidemia 05/02/2008  . Obesity (BMI 35.0-39.9 without comorbidity) 05/02/2008  . GERD 05/02/2008    Past Surgical History:  Procedure Laterality Date  . APPENDECTOMY  12/13/2009  . CHEILECTOMY Right 01/24/2014   Procedure: CHEILECTOMY;  Surgeon: Carole Civil, MD;  Location: [facility];  Service: Orthopedics;  Laterality: Right;  . CHOLECYSTECTOMY  08/15/2010   Dr. Cornelia Copa  . ctr right hand         Home Medications    Prior to Admission medications   Medication Sig Start Date End Date Taking? Authorizing Provider  diclofenac (VOLTAREN) 75 MG EC tablet TAKE 1 TABLET BY MOUTH TWICE DAILY AS NEEDED WITH FOOD 04/20/17   [provider]  ergocalciferol (VITAMIN D2) 50000 units capsule Take 1 capsule (50,000 Units total) by mouth once a week. One capsule once weekly 01/01/17   Fayrene Helper, MD  gabapentin (NEURONTIN) 400 MG capsule TAKE 1 CAPSULE BY MOUTH DAILY AT BEDTIME 10/06/17   Fayrene Helper, MD  Eloise Levels, Camillia sinensis, (GREEN TEA EXTRACT PO) Take 1 capsule by mouth daily.    [provider]  Multiple Vitamin (MULTI VITAMIN MENS PO) Take 1 tablet by mouth daily.     [provider]  omeprazole (PRILOSEC) 20 MG capsule TAKE 1 CAPSULE BY MOUTH ONCE DAILY AS NEEDED WHEN TAKING ANTI INFLAMMATORY MEDICINE 04/20/17   [provider]  pantoprazole (PROTONIX) 40 MG tablet One tablet once daily as needed for reflux 10/06/17   Fayrene Helper, MD  temazepam (RESTORIL) 15 MG capsule Take 1 capsule (15 mg total) by mouth at bedtime. 10/06/17   Fayrene Helper, MD    Family History Family History  Problem Relation Age of Onset  . Hypertension Mother   . Diabetes Mother   . Hyperlipidemia Mother   . GER disease Father   .  Hypertension Father   . Hyperlipidemia Father   . Diabetes Sister   . Hypertension Sister   . Cancer Sister        ovarian   . Diabetes Unknown        Family History   . Cancer Unknown        Family History     Social History Social History   Tobacco Use  . Smoking status: Never Smoker  . Smokeless tobacco: Never Used  Substance Use Topics  . Alcohol use: No    Comment: Occasional   . Drug use: No     Allergies   Patient has no known allergies.   Review of Systems Review of Systems  Constitutional: Negative for activity change, chills and fever.       All ROS Neg except as noted in HPI  HENT: Negative for nosebleeds.   Eyes: Negative for photophobia and discharge.  Respiratory: Negative for cough, shortness of breath and wheezing.   Cardiovascular: Negative for chest pain and palpitations.  Gastrointestinal: Positive for abdominal pain. Negative for blood in stool, diarrhea and vomiting.  Genitourinary: Negative for dysuria, frequency and hematuria.  Musculoskeletal: Negative for arthralgias, back pain and neck pain.  Skin: Negative.   Neurological: Negative for dizziness, seizures and speech difficulty.  Psychiatric/Behavioral: Negative for confusion and hallucinations.     Physical Exam Updated Vital Signs BP 132/83 (BP Location: Right Arm)   Pulse 97   Temp 98.2 F (36.8 C) (Oral)   Resp 17   SpO2 97%   Physical Exam  Constitutional: He is oriented to person, place, and time. He appears well-developed and well-nourished.  Non-toxic appearance.  HENT:  Head: Normocephalic.  Right Ear: Tympanic membrane and external ear normal.  Left Ear: Tympanic membrane and external ear normal.  Eyes: EOM and lids are normal. Pupils are equal, round, and reactive to light.  Neck: Normal range of motion. Neck supple. Carotid bruit is not present.  Cardiovascular: Normal rate, regular rhythm, normal heart sounds, intact distal pulses and normal pulses.    Pulmonary/Chest: Breath sounds normal. No respiratory distress.  Abdominal: Soft. He exhibits no distension, no pulsatile liver, no ascites, no pulsatile midline mass and no mass. There is no splenomegaly or hepatomegaly. There is tenderness in the right upper quadrant, right lower quadrant and periumbilical area. There is no rigidity and no guarding.  Musculoskeletal: Normal range of motion.  Lymphadenopathy:       Head (right side): No submandibular adenopathy present.       Head (left side): No submandibular adenopathy present.    He has no cervical adenopathy.  Neurological: He is alert and oriented to person, place, and time. He has normal strength. No cranial nerve deficit or sensory  deficit.  Skin: Skin is warm and dry.  Psychiatric: He has a normal mood and affect. His speech is normal.  Nursing note and vitals reviewed.    ED Treatments / Results  Labs (all labs ordered are listed, but only abnormal results are displayed) Labs Reviewed  COMPREHENSIVE METABOLIC PANEL  LIPASE, BLOOD  CBC WITH DIFFERENTIAL/PLATELET  URINALYSIS, ROUTINE W REFLEX MICROSCOPIC    EKG  EKG Interpretation None       Radiology No results found.  Procedures Procedures (including critical care time)  Medications Ordered in ED Medications - No data to display   Initial Impression / Assessment and Plan / ED Course  I have reviewed the triage vital signs and the nursing notes.  Pertinent labs & imaging results that were available during my care of the patient were reviewed by me and considered in my medical decision making (see chart for details).       Final Clinical Impressions(s) / ED Diagnoses MDM Vital signs within normal limits.  Pulse oximetry is 94- 97% on room air. Abdominal pain is in the right upper and lower quadrant.  Pain is also in the periumbilical area.  Complaints of metabolic panel is well within normal limits.  Lipase is normal.  Complete blood count is normal.   CT scan shows a moderate stool burden.  There is no acute abdominal problem noted on CT.  The patient will be treated in the emergency department with milk of magnesia.  Patient will be given information on high-fiber medications.  He is asked to see his GI specialist for continued workup of this pain.   Final diagnoses:  Constipation, unspecified constipation type  Abdominal pain, unspecified abdominal location    ED Discharge Orders    None       Lily Kocher, PA-C 10/12/17 [place], MD 10/13/17 [phone]

## 2017-10-12 NOTE — ED Triage Notes (Signed)
Pt c/o lower abd pain x 2 weeks. Seen dr Moshe Cipro and was given a rx last week but did not get it filled. A/o. Nad. Denies n/v/d. Denies gu sx. lnbm yesterday, denies blood

## 2017-10-12 NOTE — Discharge Instructions (Signed)
Your blood work is well within normal limits.  The CT scan shows increased stool burden, but otherwise negative for any acute problem.  Please increase the fiber in your diet.  Please increase water and juices.  Please see Dr. Moshe Cipro to complete the workup of your abdominal discomfort.  You may need a GI consult to also evaluate this pain.  Please use Tylenol every 4 hours.  Use the stool softener of your choice as well as the high-fiber diet.  Please return to the emergency department if any emergent changes, problems, or concerns.

## 2017-10-28 ENCOUNTER — Other Ambulatory Visit: Payer: Self-pay

## 2017-10-28 ENCOUNTER — Telehealth: Payer: Self-pay | Admitting: Family Medicine

## 2017-10-28 DIAGNOSIS — R1030 Lower abdominal pain, unspecified: Secondary | ICD-10-CM

## 2017-10-28 NOTE — Progress Notes (Signed)
Referral entered per recommendation of ER

## 2017-10-28 NOTE — Telephone Encounter (Signed)
Referral entered per recommendation of ER

## 2017-10-28 NOTE — Telephone Encounter (Signed)
Needs a referral to a stomach dr--please call him to advise

## 2017-11-04 ENCOUNTER — Ambulatory Visit (INDEPENDENT_AMBULATORY_CARE_PROVIDER_SITE_OTHER): Payer: 59 | Admitting: Internal Medicine

## 2017-11-10 ENCOUNTER — Telehealth (INDEPENDENT_AMBULATORY_CARE_PROVIDER_SITE_OTHER): Payer: Self-pay | Admitting: *Deleted

## 2017-11-10 ENCOUNTER — Ambulatory Visit (INDEPENDENT_AMBULATORY_CARE_PROVIDER_SITE_OTHER): Payer: 59 | Admitting: Internal Medicine

## 2017-11-10 ENCOUNTER — Encounter (INDEPENDENT_AMBULATORY_CARE_PROVIDER_SITE_OTHER): Payer: Self-pay | Admitting: *Deleted

## 2017-11-10 ENCOUNTER — Encounter (INDEPENDENT_AMBULATORY_CARE_PROVIDER_SITE_OTHER): Payer: Self-pay | Admitting: Internal Medicine

## 2017-11-10 VITALS — BP 140/80 | HR 88 | Temp 98.1°F | Ht 66.0 in | Wt 229.4 lb

## 2017-11-10 DIAGNOSIS — R1030 Lower abdominal pain, unspecified: Secondary | ICD-10-CM | POA: Insufficient documentation

## 2017-11-10 MED ORDER — PEG 3350-KCL-NA BICARB-NACL 420 G PO SOLR: 4000.0000 mL | Freq: Once | ORAL | 0 refills | Status: AC

## 2017-11-10 NOTE — Progress Notes (Signed)
   Subjective:    Patient ID: Anthony Erickson, male    DOB: 08-05-1961, 57 y.o.   MRN: [id number]  HPI Referred by Dr. Moshe Cipro for lower abdominal pain.  Seen in the ED 10/12/2018 with constipation. Had not had a BM in 3 days. Denies prior hx of constipation.  He was given MOM and a stool softener. He says he had a BM two days after going to the ED.  States now he is having BM regularly. He still has pain across his mid abdomen in the morning. The pain resolves as the day progresses.  He underwent a CT scan abdomen which revealed no acute pelvic pathology. His last colonoscopy was by Dr. Gala Romney  In 2011 with one polyp and was hyperplastic.  There is no pain at this time.  No NSAIDS No family hx of colon cancer.  Hx of prostate cancer.     Review of Systems Past Medical History:  Diagnosis Date  . Cancer ([place])   . Condyloma acuminatum   . GERD (gastroesophageal reflux disease)   . Hyperlipidemia   . Obesity   . Prostate cancer ([place])   . Tendinitis    Of left hand     Past Surgical History:  Procedure Laterality Date  . APPENDECTOMY  12/13/2009  . CHEILECTOMY Right 01/24/2014   Procedure: CHEILECTOMY;  Surgeon: Carole Civil, MD;  Location: [facility];  Service: Orthopedics;  Laterality: Right;  . CHOLECYSTECTOMY  08/15/2010   Dr. Cornelia Copa  . ctr right hand      No Known Allergies  Current Outpatient Medications on File Prior to Visit  Medication Sig Dispense Refill  . diclofenac (VOLTAREN) 75 MG EC tablet TAKE 1 TABLET BY MOUTH TWICE DAILY AS NEEDED WITH FOOD  1  . ergocalciferol (VITAMIN D2) 50000 units capsule Take 1 capsule (50,000 Units total) by mouth once a week. One capsule once weekly 12 capsule 1  . Green Tea, Camillia sinensis, (GREEN TEA EXTRACT PO) Take 1 capsule by mouth daily.    . Multiple Vitamin (MULTI VITAMIN MENS PO) Take 1 tablet by mouth daily.     Marland Kitchen omeprazole (PRILOSEC) 20 MG capsule TAKE 1 CAPSULE BY MOUTH ONCE DAILY AS NEEDED WHEN TAKING ANTI INFLAMMATORY  MEDICINE  2  . pantoprazole (PROTONIX) 40 MG tablet One tablet once daily as needed for reflux 60 tablet 3  . temazepam (RESTORIL) 15 MG capsule Take 1 capsule (15 mg total) by mouth at bedtime. 90 capsule 1   No current facility-administered medications on file prior to visit.         Objective:   Physical Exam Blood pressure 140/80, pulse 88, temperature 98.1 F (36.7 C), height 5\' 6"  (1.676 m), weight 229 lb 6.4 oz (104.1 kg).  Alert and oriented. Skin warm and dry. Oral mucosa is moist.   . Sclera anicteric, conjunctivae is pink. Thyroid not enlarged. No cervical lymphadenopathy. Lungs clear. Heart regular rate and rhythm.  Abdomen is soft. Bowel sounds are positive. No hepatomegaly. No abdominal masses felt. No tenderness.  No edema to lower extremities.  Stool brown and guaiac negative.         Assessment & Plan:  Constipation. Resolved. Still having some abdominal pain. CT was normal. Abdominal pain: I think he needs a colonoscopy to rule out colon carcinoma.  The risks of bleeding, perforation and infection were reviewed with patient.

## 2017-11-10 NOTE — Telephone Encounter (Signed)
Patient needs trilyte 

## 2017-11-10 NOTE — Patient Instructions (Signed)
Colonoscopy. The risks of bleeding, perforation and infection were reviewed with patient.  

## 2017-11-30 DIAGNOSIS — Z8546 Personal history of malignant neoplasm of prostate: Secondary | ICD-10-CM | POA: Diagnosis not present

## 2017-12-03 ENCOUNTER — Ambulatory Visit (INDEPENDENT_AMBULATORY_CARE_PROVIDER_SITE_OTHER): Payer: 59 | Admitting: Urology

## 2017-12-03 ENCOUNTER — Ambulatory Visit: Payer: 59 | Admitting: Urology

## 2017-12-03 DIAGNOSIS — Z8546 Personal history of malignant neoplasm of prostate: Secondary | ICD-10-CM

## 2017-12-15 ENCOUNTER — Other Ambulatory Visit: Payer: Self-pay

## 2017-12-15 ENCOUNTER — Encounter (HOSPITAL_COMMUNITY): Payer: Self-pay | Admitting: *Deleted

## 2017-12-15 ENCOUNTER — Ambulatory Visit (HOSPITAL_COMMUNITY)
Admission: RE | Admit: 2017-12-15 | Discharge: 2017-12-15 | Disposition: A | Discharge status: Home / Self Care | Payer: 59 | Source: Ambulatory Visit | Attending: Internal Medicine | Admitting: Internal Medicine

## 2017-12-15 ENCOUNTER — Encounter (HOSPITAL_COMMUNITY)
Admission: RE | Disposition: A | Discharge status: Home / Self Care | Payer: Self-pay | Source: Ambulatory Visit | Attending: Internal Medicine

## 2017-12-15 DIAGNOSIS — E669 Obesity, unspecified: Secondary | ICD-10-CM | POA: Diagnosis not present

## 2017-12-15 DIAGNOSIS — Z79899 Other long term (current) drug therapy: Secondary | ICD-10-CM | POA: Diagnosis not present

## 2017-12-15 DIAGNOSIS — Z1211 Encounter for screening for malignant neoplasm of colon: Secondary | ICD-10-CM | POA: Diagnosis not present

## 2017-12-15 DIAGNOSIS — Z8546 Personal history of malignant neoplasm of prostate: Secondary | ICD-10-CM | POA: Insufficient documentation

## 2017-12-15 DIAGNOSIS — K5730 Diverticulosis of large intestine without perforation or abscess without bleeding: Secondary | ICD-10-CM | POA: Diagnosis not present

## 2017-12-15 DIAGNOSIS — K648 Other hemorrhoids: Secondary | ICD-10-CM | POA: Diagnosis not present

## 2017-12-15 DIAGNOSIS — D121 Benign neoplasm of appendix: Secondary | ICD-10-CM | POA: Diagnosis not present

## 2017-12-15 DIAGNOSIS — K219 Gastro-esophageal reflux disease without esophagitis: Secondary | ICD-10-CM | POA: Diagnosis not present

## 2017-12-15 DIAGNOSIS — Z6836 Body mass index (BMI) 36.0-36.9, adult: Secondary | ICD-10-CM | POA: Insufficient documentation

## 2017-12-15 DIAGNOSIS — R1030 Lower abdominal pain, unspecified: Secondary | ICD-10-CM | POA: Insufficient documentation

## 2017-12-15 DIAGNOSIS — D120 Benign neoplasm of cecum: Secondary | ICD-10-CM | POA: Diagnosis not present

## 2017-12-15 HISTORY — PX: POLYPECTOMY: SHX5525

## 2017-12-15 HISTORY — PX: COLONOSCOPY: SHX5424

## 2017-12-15 SURGERY — COLONOSCOPY
Anesthesia: Moderate Sedation

## 2017-12-15 MED ORDER — MIDAZOLAM HCL 5 MG/5ML IJ SOLN
INTRAMUSCULAR | Status: AC
  Filled 2017-12-15: qty 10

## 2017-12-15 MED ORDER — STERILE WATER FOR IRRIGATION IR SOLN
Status: DC | PRN
  Administered 2017-12-15: 14:00:00

## 2017-12-15 MED ORDER — SODIUM CHLORIDE 0.9 % IV SOLN
INTRAVENOUS | Status: DC
  Administered 2017-12-15: 1000 mL via INTRAVENOUS

## 2017-12-15 MED ORDER — MIDAZOLAM HCL 5 MG/5ML IJ SOLN
INTRAMUSCULAR | Status: DC | PRN
  Administered 2017-12-15: 2 mg via INTRAVENOUS
  Administered 2017-12-15: 1 mg via INTRAVENOUS
  Administered 2017-12-15 (×2): 2 mg via INTRAVENOUS

## 2017-12-15 MED ORDER — MEPERIDINE HCL 50 MG/ML IJ SOLN
INTRAMUSCULAR | Status: DC | PRN
  Administered 2017-12-15 (×2): 25 mg via INTRAVENOUS

## 2017-12-15 MED ORDER — MEPERIDINE HCL 50 MG/ML IJ SOLN
INTRAMUSCULAR | Status: AC
  Filled 2017-12-15: qty 1

## 2017-12-15 NOTE — Op Note (Signed)
[facility] Patient Name: Anthony Erickson Procedure Date: 12/15/2017 1:37 PM MRN: [id number] Date of Birth: 07-18-1961 Attending MD: Hildred Laser , MD CSN: [id number] Age: 57 Admit Type: Outpatient Procedure:                Colonoscopy Indications:              Screening for colorectal malignant neoplasm Providers:                Hildred Laser, MD, Otis Peak B. Sharon Seller, RN, Nelma Rothman, Technician Referring MD:             Norwood Levo. Moshe Cipro, MD Medicines:                Meperidine 50 mg IV, Midazolam 7 mg IV Complications:            No immediate complications. Estimated Blood Loss:     Estimated blood loss was minimal. Procedure:                Pre-Anesthesia Assessment:                           - Prior to the procedure, a History and Physical                            was performed, and patient medications and                            allergies were reviewed. The patient's tolerance of                            previous anesthesia was also reviewed. The risks                            and benefits of the procedure and the sedation                            options and risks were discussed with the patient.                            All questions were answered, and informed consent                            was obtained. Prior Anticoagulants: The patient has                            taken no previous anticoagulant or antiplatelet                            agents. ASA Grade Assessment: I - A normal, healthy                            patient. After reviewing the risks and benefits,  the patient was deemed in satisfactory condition to                            undergo the procedure.                           After obtaining informed consent, the colonoscope                            was passed under direct vision. Throughout the                            procedure, the patient's blood pressure, pulse, and                   oxygen saturations were monitored continuously. The                            [id number] ([id number]) scope was introduced through                            the anus and advanced to the the cecum, identified                            by appendiceal orifice and ileocecal valve. The                            colonoscopy was somewhat difficult. Successful                            completion of the procedure was aided by changing                            the patient's position and using manual pressure.                            The patient tolerated the procedure well. The                            quality of the bowel preparation was adequate. The                            ileocecal valve, appendiceal orifice, and rectum                            were photographed. Scope In: 1:55:31 PM Scope Out: 2:25:56 PM Scope Withdrawal Time: 0 hours 19 minutes 25 seconds  Total Procedure Duration: 0 hours 30 minutes 25 seconds  Findings:      The perianal and digital rectal examinations were normal.      A 6 to 8 mm polyp was found adjacent to appendiceal orifice/stump. The       polyp was carpet-like. Biopsies were taken with a cold forceps for       histology. The pathology specimen was placed into Bottle Number 1.       Coagulation for destruction of  remaining portion of lesion using argon       plasma was successful.      A few small-mouthed diverticula were found in the sigmoid colon.      Internal hemorrhoids were found during retroflexion. The hemorrhoids       were medium-sized. Impression:               - One 6 to 8 mm polyp adjacent to appendiceal                            orifice. Biopsied. Treated with argon plasma                            coagulation ([facility]C).                           - Diverticulosis in the sigmoid colon.                           - Internal hemorrhoids. Moderate Sedation:      Moderate (conscious) sedation was administered by the endoscopy  nurse       and supervised by the endoscopist. The following parameters were       monitored: oxygen saturation, heart rate, blood pressure, CO2       capnography and response to care. Total physician intraservice time was       35 minutes. Recommendation:           - Patient has a contact number available for                            emergencies. The signs and symptoms of potential                            delayed complications were discussed with the                            patient. Return to normal activities tomorrow.                            Written discharge instructions were provided to the                            patient.                           - High fiber diet today.                           - Continue present medications.                           - No aspirin, ibuprofen, naproxen, or other                            non-steroidal anti-inflammatory drugs for 7 days.                           -  Await pathology results.                           - Repeat colonoscopy is recommended. The                            colonoscopy date will be determined after pathology                            results from today's exam become available for                            review. Procedure Code(s):        --- Professional ---                           [phone], Colonoscopy, flexible; with ablation of                            tumor(s), polyp(s), or other lesion(s) (includes                            pre- and post-dilation and guide wire passage, when                            performed)                           99152, Moderate sedation services provided by the                            same physician or other qualified health care                            professional performing the diagnostic or                            therapeutic service that the sedation supports,                            requiring the presence of an independent trained                             observer to assist in the monitoring of the                            patient's level of consciousness and physiological                            status; initial 15 minutes of intraservice time,                            patient age 8 years or older                           [phone],  Moderate sedation services; each additional                            15 minutes intraservice time Diagnosis Code(s):        --- Professional ---                           Z12.11, Encounter for screening for malignant                            neoplasm of colon                           D12.1, Benign neoplasm of appendix                           K64.8, Other hemorrhoids                           K57.30, Diverticulosis of large intestine without                            perforation or abscess without bleeding CPT copyright 2016 American Medical Association. All rights reserved. The codes documented in this report are preliminary and upon coder review may  be revised to meet current compliance requirements. Hildred Laser, MD Hildred Laser, MD 12/15/2017 2:36:45 PM This report has been signed electronically. Number of Addenda: 0

## 2017-12-15 NOTE — H&P (Signed)
Anthony Erickson is an 57 y.o. male.   Chief Complaint: Patient is here for colonoscopy. HPI: Male who is here for screening colonoscopy.  Few weeks ago he had self-limiting abdominal pain in setting of constipation.  He denies melena or rectal bleeding anorexia or weight loss.  His bowels are now regular. Personal history significant for prostate carcinoma. Family history is negative for CRC.  Past Medical History:  Diagnosis Date  . Cancer ([place])   . Condyloma acuminatum   . GERD (gastroesophageal reflux disease)   . Hyperlipidemia   . Obesity   . Prostate cancer ([place])   . Tendinitis    Of left hand     Past Surgical History:  Procedure Laterality Date  . APPENDECTOMY  12/13/2009  . CHEILECTOMY Right 01/24/2014   Procedure: CHEILECTOMY;  Surgeon: Carole Civil, MD;  Location: [facility];  Service: Orthopedics;  Laterality: Right;  . CHOLECYSTECTOMY  08/15/2010   Dr. Cornelia Copa  . ctr right hand    . prostate seed implant     2015    Family History  Problem Relation Age of Onset  . Hypertension Mother   . Diabetes Mother   . Hyperlipidemia Mother   . GER disease Father   . Hypertension Father   . Hyperlipidemia Father   . Diabetes Sister   . Hypertension Sister   . Cancer Sister        ovarian   . Diabetes Unknown        Family History   . Cancer Unknown        Family History    Social History:  reports that  has never smoked. he has never used smokeless tobacco. He reports that he does not drink alcohol or use drugs.  Allergies: No Known Allergies  Medications Prior to Admission  Medication Sig Dispense Refill  . Cholecalciferol (VITAMIN D3) 1000 units CAPS Take 1,000 Units by mouth daily.    . Multiple Vitamin (MULTI VITAMIN MENS PO) Take 1 tablet by mouth daily.     . pantoprazole (PROTONIX) 40 MG tablet One tablet once daily as needed for reflux (Patient taking differently: Take 40 mg by mouth daily as needed (Acid reflux). ) 60 tablet 3  . ergocalciferol  (VITAMIN D2) 50000 units capsule Take 1 capsule (50,000 Units total) by mouth once a week. One capsule once weekly (Patient not taking: Reported on 12/13/2017) 12 capsule 1  . temazepam (RESTORIL) 15 MG capsule Take 1 capsule (15 mg total) by mouth at bedtime. (Patient not taking: Reported on 12/13/2017) 90 capsule 1    No results found for this or any previous visit (from the past 48 hour(s)). No results found.  ROS  Blood pressure (!) 144/91, pulse (!) 103, temperature 99.1 F (37.3 C), temperature source Oral, resp. rate 16, height 5\' 6"  (1.676 m), weight 224 lb (101.6 kg), SpO2 99 %. Physical Exam  Constitutional: He appears well-developed and well-nourished.  HENT:  Mouth/Throat: Oropharynx is clear and moist.  Eyes: Conjunctivae are normal. No scleral icterus.  Neck: No thyromegaly present.  Cardiovascular: Normal rate, regular rhythm and normal heart sounds.  No murmur heard. Respiratory: Effort normal and breath sounds normal.  GI:  Abdomen is symmetrical with laparoscopy scars.  Abdomen is soft and nontender with organomegaly or masses.  Musculoskeletal: He exhibits no edema.  Lymphadenopathy:    He has no cervical adenopathy.  Neurological: He is alert.  Skin: Skin is warm and dry.     Assessment/Plan Average  risk screening colonoscopy.  Hildred Laser, MD 12/15/2017, 1:45 PM

## 2017-12-15 NOTE — Discharge Instructions (Signed)
Colonoscopy, Adult, Care After This sheet gives you information about how to care for yourself after your procedure. Your doctor may also give you more specific instructions. If you have problems or questions, call your doctor. Follow these instructions at home: General instructions   For the first 24 hours after the procedure: ? Do not drive or use machinery. ? Do not sign important documents. ? Do not drink alcohol. ? Do your daily activities more slowly than normal. ? Eat foods that are soft and easy to digest. ? Rest often.  Take over-the-counter or prescription medicines only as told by your doctor.  It is up to you to get the results of your procedure. Ask your doctor, or the department performing the procedure, when your results will be ready. To help cramping and bloating:  Try walking around.  Put heat on your belly (abdomen) as told by your doctor. Use a heat source that your doctor recommends, such as a moist heat pack or a heating pad. ? Put a towel between your skin and the heat source. ? Leave the heat on for 20-30 minutes. ? Remove the heat if your skin turns bright red. This is especially important if you cannot feel pain, heat, or cold. You can get burned. Eating and drinking  Drink enough fluid to keep your pee (urine) clear or pale yellow.  Return to your normal diet as told by your doctor. Avoid heavy or fried foods that are hard to digest.  Avoid drinking alcohol for as long as told by your doctor. Contact a doctor if:  You have blood in your poop (stool) 2-3 days after the procedure. Get help right away if:  You have more than a small amount of blood in your poop.  You see large clumps of tissue (blood clots) in your poop.  Your belly is swollen.  You feel sick to your stomach (nauseous).  You throw up (vomit).  You have a fever.  You have belly pain that gets worse, and medicine does not help your pain. This information is not intended to  replace advice given to you by your health care provider. Make sure you discuss any questions you have with your health care provider. Document Released: 11/21/2010 Document Revised: 07/13/2016 Document Reviewed: 07/13/2016 Elsevier Interactive Patient Education  2017 [place]. Colon Polyps Polyps are tissue growths inside the body. Polyps can grow in many places, including the large intestine (colon). A polyp may be a round bump or a mushroom-shaped growth. You could have one polyp or several. Most colon polyps are noncancerous (benign). However, some colon polyps can become cancerous over time. What are the causes? The exact cause of colon polyps is not known. What increases the risk? This condition is more likely to develop in people who:  Have a family history of colon cancer or colon polyps.  Are older than 79 or older than 45 if they are African American.  Have inflammatory bowel disease, such as ulcerative colitis or Crohn disease.  Are overweight.  Smoke cigarettes.  Do not get enough exercise.  Drink too much alcohol.  Eat a diet that is: ? High in fat and red meat. ? Low in fiber.  Had childhood cancer that was treated with abdominal radiation.  What are the signs or symptoms? Most polyps do not cause symptoms. If you have symptoms, they may include:  Blood coming from your rectum when having a bowel movement.  Blood in your stool.The stool may look dark red  or black.  A change in bowel habits, such as constipation or diarrhea.  How is this diagnosed? This condition is diagnosed with a colonoscopy. This is a procedure that uses a lighted, flexible scope to look at the inside of your colon. How is this treated? Treatment for this condition involves removing any polyps that are found. Those polyps will then be tested for cancer. If cancer is found, your health care provider will talk to you about options for colon cancer treatment. Follow these instructions  at home: Diet  Eat plenty of fiber, such as fruits, vegetables, and whole grains.  Eat foods that are high in calcium and vitamin D, such as milk, cheese, yogurt, eggs, liver, fish, and broccoli.  Limit foods high in fat, red meats, and processed meats, such as hot dogs, sausage, bacon, and lunch meats.  Maintain a healthy weight, or lose weight if recommended by your health care provider. General instructions  Do not smoke cigarettes.  Do not drink alcohol excessively.  Keep all follow-up visits as told by your health care provider. This is important. This includes keeping regularly scheduled colonoscopies. Talk to your health care provider about when you need a colonoscopy.  Exercise every day or as told by your health care provider. Contact a health care provider if:  You have new or worsening bleeding during a bowel movement.  You have new or increased blood in your stool.  You have a change in bowel habits.  You unexpectedly lose weight. This information is not intended to replace advice given to you by your health care provider. Make sure you discuss any questions you have with your health care provider. Document Released: 07/15/2004 Document Revised: 03/26/2016 Document Reviewed: 09/09/2015 Elsevier Interactive Patient Education  [employer]. Diverticulosis Diverticulosis is a condition that develops when small pouches (diverticula) form in the wall of the large intestine (colon). The colon is where water is absorbed and stool is formed. The pouches form when the inside layer of the colon pushes through weak spots in the outer layers of the colon. You may have a few pouches or many of them. What are the causes? The cause of this condition is not known. What increases the risk? The following factors may make you more likely to develop this condition:  Being older than age 47. Your risk for this condition increases with age. Diverticulosis is rare among people  younger than age 3. By age 46, many people have it.  Eating a low-fiber diet.  Having frequent constipation.  Being overweight.  Not getting enough exercise.  Smoking.  Taking over-the-counter pain medicines, like aspirin and ibuprofen.  Having a family history of diverticulosis.  What are the signs or symptoms? In most people, there are no symptoms of this condition. If you do have symptoms, they may include:  Bloating.  Cramps in the abdomen.  Constipation or diarrhea.  Pain in the lower left side of the abdomen.  How is this diagnosed? This condition is most often diagnosed during an exam for other colon problems. Because diverticulosis usually has no symptoms, it often cannot be diagnosed independently. This condition may be diagnosed by:  Using a flexible scope to examine the colon (colonoscopy).  Taking an X-ray of the colon after dye has been put into the colon (barium enema).  Doing a CT scan.  How is this treated? You may not need treatment for this condition if you have never developed an infection related to diverticulosis. If you have had  an infection before, treatment may include:  Eating a high-fiber diet. This may include eating more fruits, vegetables, and grains.  Taking a fiber supplement.  Taking a live bacteria supplement (probiotic).  Taking medicine to relax your colon.  Taking antibiotic medicines.  Follow these instructions at home:  Drink 6-8 glasses of water or more each day to prevent constipation.  Try not to strain when you have a bowel movement.  If you have had an infection before: ? Eat more fiber as directed by your health care provider or your diet and nutrition specialist (dietitian). ? Take a fiber supplement or probiotic, if your health care provider approves.  Take over-the-counter and prescription medicines only as told by your health care provider.  If you were prescribed an antibiotic, take it as told by your  health care provider. Do not stop taking the antibiotic even if you start to feel better.  Keep all follow-up visits as told by your health care provider. This is important. Contact a health care provider if:  You have pain in your abdomen.  You have bloating.  You have cramps.  You have not had a bowel movement in 3 days. Get help right away if:  Your pain gets worse.  Your bloating becomes very bad.  You have a fever or chills, and your symptoms suddenly get worse.  You vomit.  You have bowel movements that are bloody or black.  You have bleeding from your rectum. Summary  Diverticulosis is a condition that develops when small pouches (diverticula) form in the wall of the large intestine (colon).  You may have a few pouches or many of them.  This condition is most often diagnosed during an exam for other colon problems.  If you have had an infection related to diverticulosis, treatment may include increasing the fiber in your diet, taking supplements, or taking medicines. This information is not intended to replace advice given to you by your health care provider. Make sure you discuss any questions you have with your health care provider. Document Released: 07/16/2004 Document Revised: 09/07/2016 Document Reviewed: 09/07/2016 Elsevier Interactive Patient Education  2017 [place]. Aspirin or NSAIDs for 1 week. Resume usual medications as before. High-fiber diet. No driving for 24 hours. Physician will call with biopsy results.

## 2017-12-21 ENCOUNTER — Telehealth (INDEPENDENT_AMBULATORY_CARE_PROVIDER_SITE_OTHER): Payer: Self-pay | Admitting: *Deleted

## 2017-12-21 ENCOUNTER — Encounter (HOSPITAL_COMMUNITY): Payer: Self-pay | Admitting: Internal Medicine

## 2017-12-21 NOTE — Telephone Encounter (Signed)
Patient left message returning a call about his results.

## 2017-12-22 NOTE — Telephone Encounter (Signed)
Notes recorded by Rogene Houston, MD on 12/21/2017 at 5:44 PM EST Results reviewed with patient. He had polyp right next to the appendiceal stump and it is sessile serrated polyp. Next colonoscopy in 3 years. Report to PCP.

## 2017-12-22 NOTE — Telephone Encounter (Signed)
3 yr TCS noted in recall  

## 2018-01-17 MED FILL — GABAPENTIN 400 MG CAPSULE: 400 | 90 days supply | Qty: 90 | Fill #0

## 2018-01-31 ENCOUNTER — Ambulatory Visit: Payer: 59 | Admitting: Family Medicine

## 2018-04-09 IMAGING — CT CT ABD-PELV W/ CM
2 of 5 series · 16 of 46 positions shown, 18 images · IV contrast (Isovue)
Comparison: None.

CLINICAL DATA: Right upper quadrant right lower quadrant pain

EXAM:
CT ABDOMEN AND PELVIS WITH CONTRAST
TECHNIQUE: Multidetector CT imaging of the abdomen and pelvis was performed
using the standard protocol following bolus administration of
intravenous contrast.
CONTRAST:  100mL [id number] IOPAMIDOL ([id number]) INJECTION 61%

[Series 2: axial st · axial · 0.75mm/px · z∈[+846,+1276]mm · 13 of 98 slices shown, 15 images]
[im 6/98  soft-tissue]
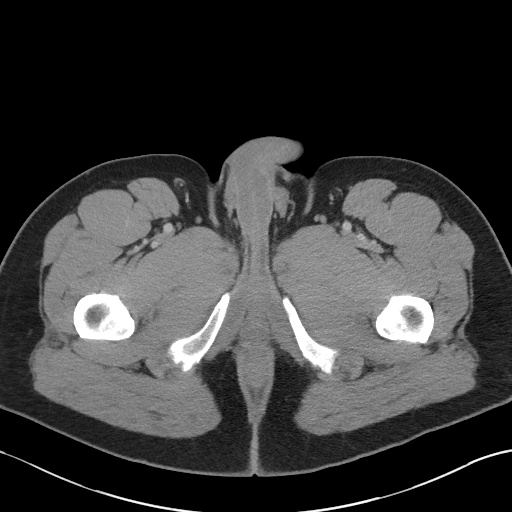
[im 6/98  bone]
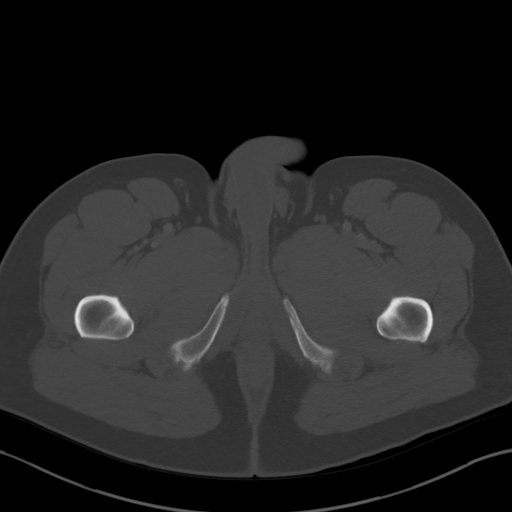
[im 12/98  soft-tissue]
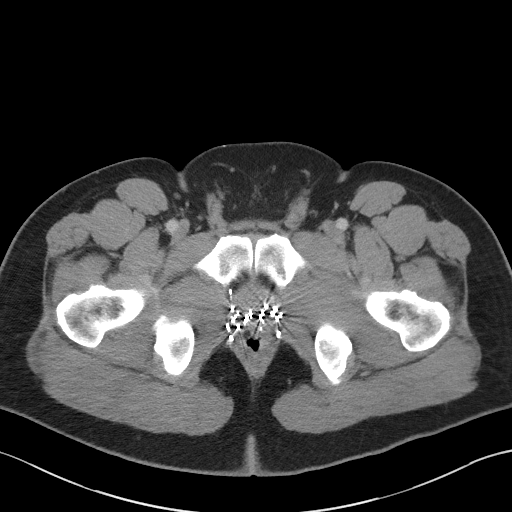
[im 23/98  soft-tissue]
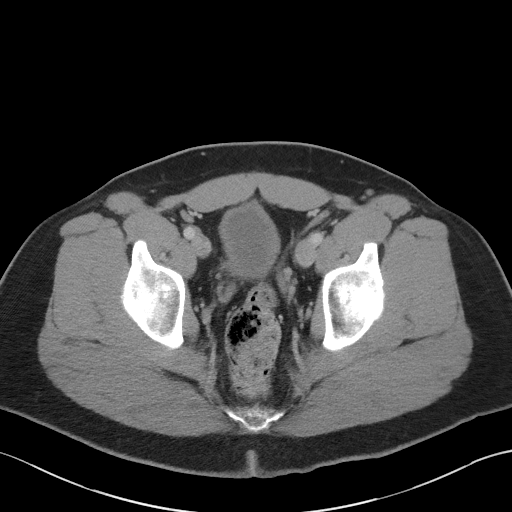
[im 29/98  soft-tissue]
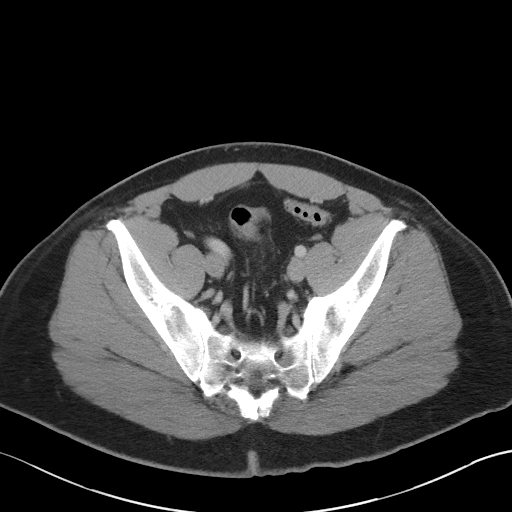
[im 35/98  soft-tissue]
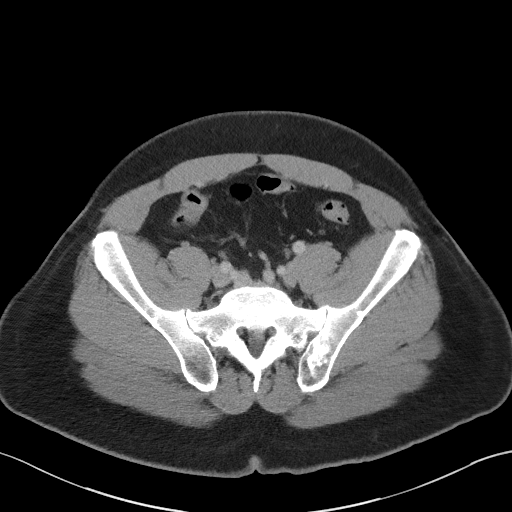
[im 40/98  soft-tissue]
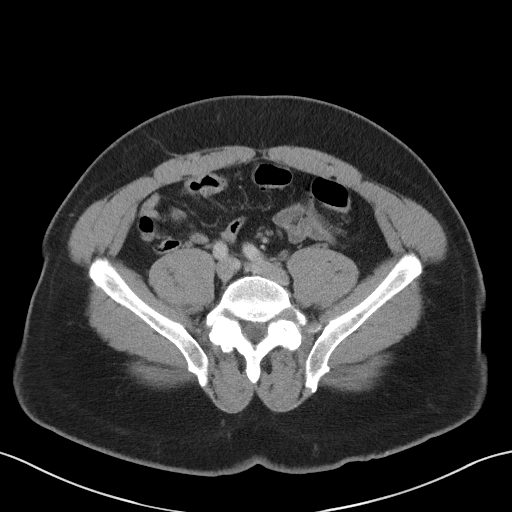
[im 52/98  soft-tissue]
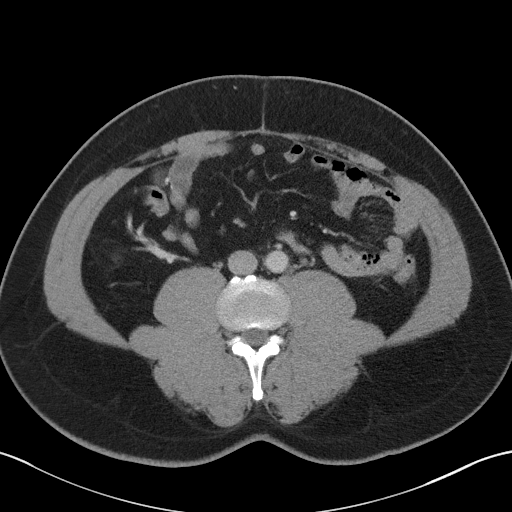
[im 58/98  soft-tissue]
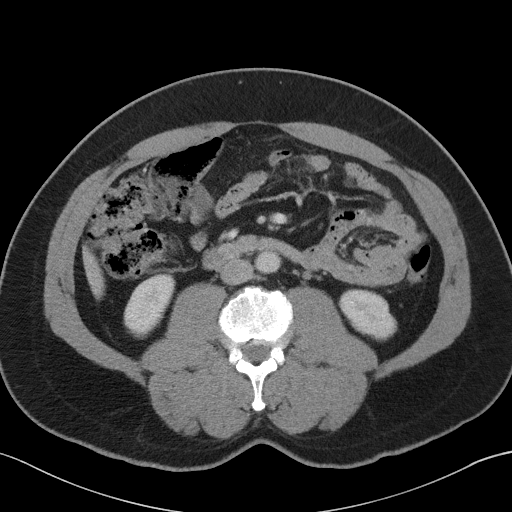
[im 63/98  soft-tissue]
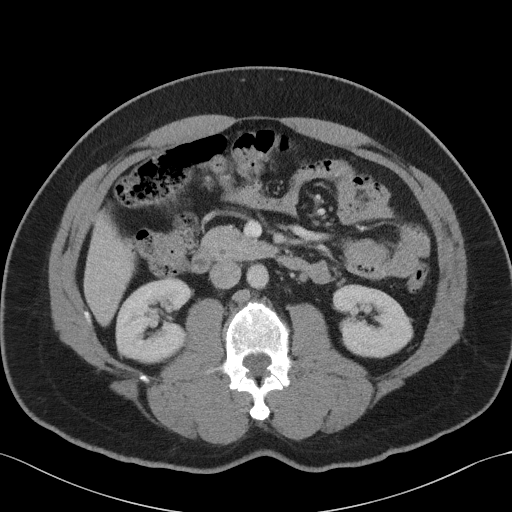
[im 63/98  bone]
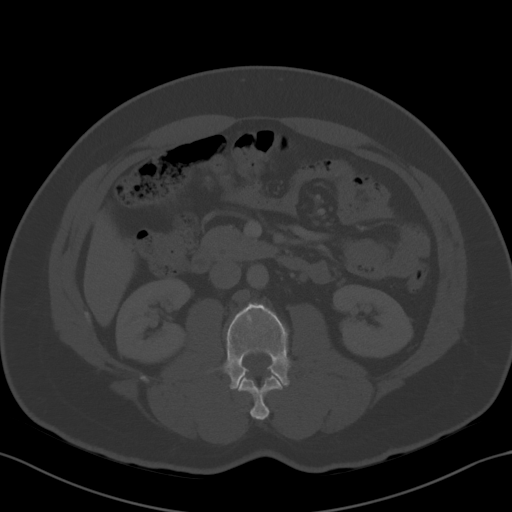
[im 69/98  soft-tissue]
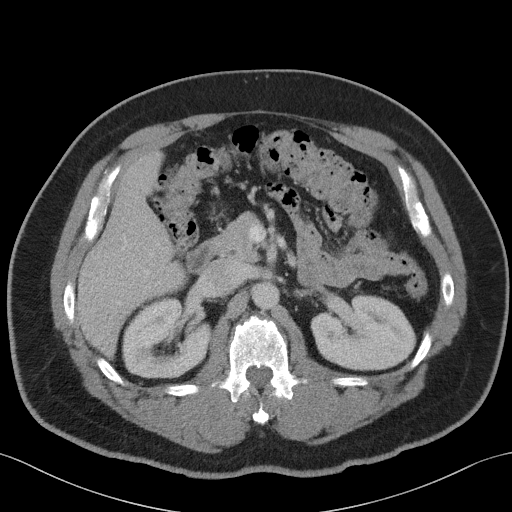
[im 75/98  soft-tissue]
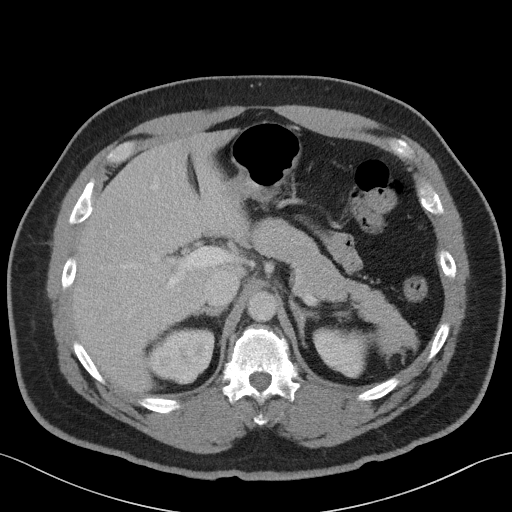
[im 86/98  soft-tissue]
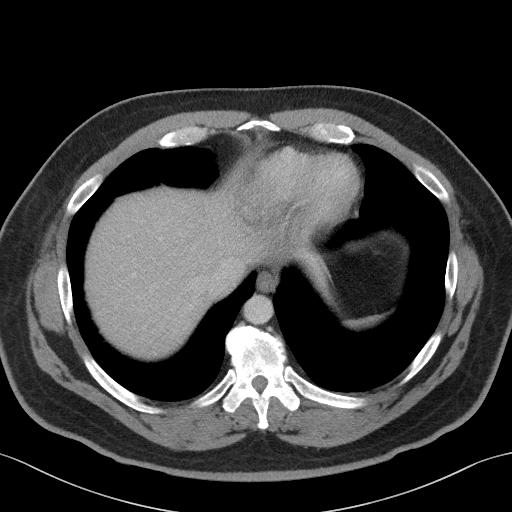
[im 92/98  soft-tissue]
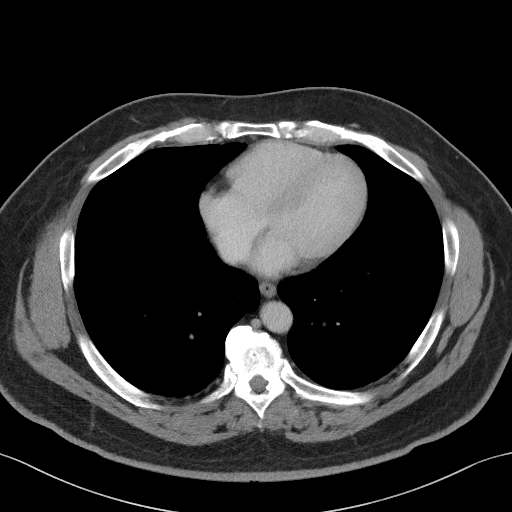

[Series 6: coronal st · coronal · 0.85mm/px · 3 of 116 slices shown]
[im 39/116  soft-tissue]
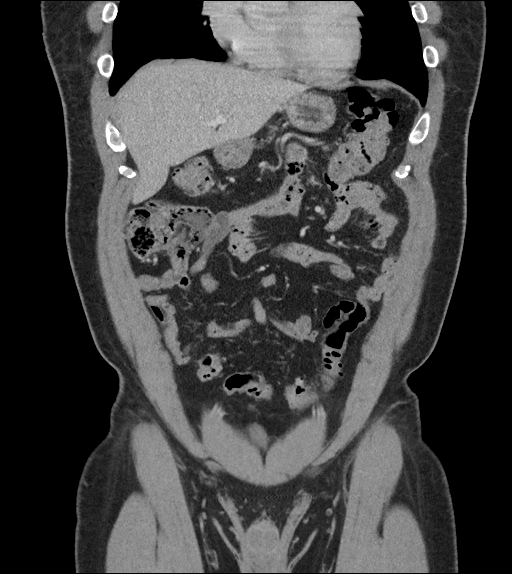
[im 52/116  soft-tissue]
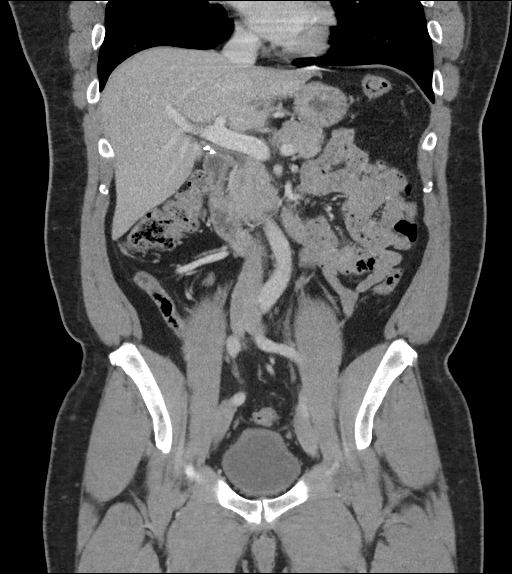
[im 64/116  soft-tissue]
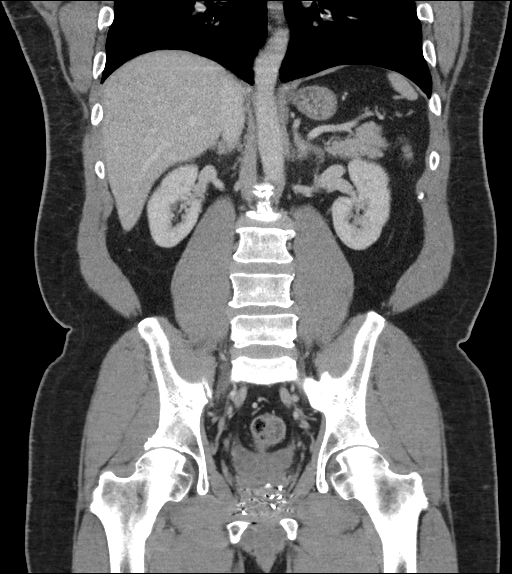

[16 of 46 positions shown; findings below may reference images not displayed]

FINDINGS: Lower chest: No acute abnormality.

Hepatobiliary: No focal liver abnormality is seen. Status post
cholecystectomy. No biliary dilatation.

Pancreas: Unremarkable. No pancreatic ductal dilatation or
surrounding inflammatory changes.

Spleen: Normal in size without focal abnormality.

Adrenals/Urinary Tract: Adrenal glands are unremarkable. Kidneys are
normal, without renal calculi, focal lesion, or hydronephrosis.
Bladder is unremarkable.

Stomach/Bowel: Stomach is within normal limits. Prior appendectomy.
Moderate amount of stool throughout the colon. No evidence of bowel
wall thickening, distention, or inflammatory changes.

Vascular/Lymphatic: No significant vascular findings are present. No
enlarged abdominal or pelvic lymph nodes.

Reproductive: Prostate is unremarkable.

Other: No abdominal wall hernia or abnormality. No abdominopelvic
ascites.

Musculoskeletal: No acute osseous abnormality. No aggressive lytic
or sclerotic osseous lesion. Peripherally sclerotic bone lesion in
the left posterior ilium unchanged compared with 06/25/2010 likely
reflecting a benign fibro-osseous lesion. Mild osteoarthritis of
bilateral sacroiliac joints. Mild thoracolumbar spondylosis.
IMPRESSION: 1. No acute abdominal or pelvic pathology.

## 2018-07-20 ENCOUNTER — Telehealth: Payer: Self-pay | Admitting: Family Medicine

## 2018-07-20 DIAGNOSIS — Z8546 Personal history of malignant neoplasm of prostate: Secondary | ICD-10-CM | POA: Diagnosis not present

## 2018-07-20 DIAGNOSIS — E785 Hyperlipidemia, unspecified: Secondary | ICD-10-CM

## 2018-07-20 DIAGNOSIS — E559 Vitamin D deficiency, unspecified: Secondary | ICD-10-CM

## 2018-07-20 DIAGNOSIS — R7301 Impaired fasting glucose: Secondary | ICD-10-CM

## 2018-07-20 NOTE — Telephone Encounter (Signed)
Lab order mailed.

## 2018-07-20 NOTE — Telephone Encounter (Signed)
Patients labs are expired, please order new labs & mail to him. Scheduled end of October.

## 2018-07-22 ENCOUNTER — Ambulatory Visit (INDEPENDENT_AMBULATORY_CARE_PROVIDER_SITE_OTHER): Payer: 59 | Admitting: Urology

## 2018-07-22 DIAGNOSIS — Z8546 Personal history of malignant neoplasm of prostate: Secondary | ICD-10-CM | POA: Diagnosis not present

## 2018-08-22 ENCOUNTER — Ambulatory Visit (INDEPENDENT_AMBULATORY_CARE_PROVIDER_SITE_OTHER): Payer: 59 | Admitting: Family Medicine

## 2018-08-22 ENCOUNTER — Ambulatory Visit (HOSPITAL_COMMUNITY)
Admission: RE | Admit: 2018-08-22 | Discharge: 2018-08-22 | Disposition: A | Discharge status: Home / Self Care | Payer: 59 | Source: Ambulatory Visit | Attending: Family Medicine | Admitting: Family Medicine

## 2018-08-22 ENCOUNTER — Encounter: Payer: Self-pay | Admitting: Family Medicine

## 2018-08-22 VITALS — BP 118/64 | HR 98 | Resp 12 | Ht 66.0 in | Wt 238.0 lb

## 2018-08-22 DIAGNOSIS — M545 Low back pain, unspecified: Secondary | ICD-10-CM

## 2018-08-22 DIAGNOSIS — Z23 Encounter for immunization: Secondary | ICD-10-CM | POA: Diagnosis not present

## 2018-08-22 DIAGNOSIS — E559 Vitamin D deficiency, unspecified: Secondary | ICD-10-CM

## 2018-08-22 DIAGNOSIS — M25562 Pain in left knee: Secondary | ICD-10-CM | POA: Diagnosis not present

## 2018-08-22 DIAGNOSIS — C61 Malignant neoplasm of prostate: Secondary | ICD-10-CM | POA: Diagnosis not present

## 2018-08-22 DIAGNOSIS — E785 Hyperlipidemia, unspecified: Secondary | ICD-10-CM | POA: Diagnosis not present

## 2018-08-22 DIAGNOSIS — E669 Obesity, unspecified: Secondary | ICD-10-CM

## 2018-08-22 DIAGNOSIS — G8929 Other chronic pain: Secondary | ICD-10-CM | POA: Diagnosis not present

## 2018-08-22 MED ORDER — IBUPROFEN 800 MG PO TABS: 800.0000 mg | ORAL_TABLET | Freq: Three times a day (TID) | ORAL | 0 refills | Status: DC | PRN

## 2018-08-22 MED ORDER — METHYLPREDNISOLONE ACETATE 80 MG/ML IJ SUSP
80.0000 mg | Freq: Once | INTRAMUSCULAR | Status: AC
  Administered 2018-08-22: 80 mg via INTRAMUSCULAR

## 2018-08-22 MED ORDER — GABAPENTIN 400 MG PO CAPS: 400.0000 mg | ORAL_CAPSULE | Freq: Every day | ORAL | 3 refills | Status: DC

## 2018-08-22 MED ORDER — PREDNISONE 10 MG (21) PO TBPK: ORAL_TABLET | ORAL | 0 refills | Status: DC

## 2018-08-22 MED ORDER — KETOROLAC TROMETHAMINE 60 MG/2ML IM SOLN
60.0000 mg | Freq: Once | INTRAMUSCULAR | Status: AC
  Administered 2018-08-22: 60 mg via INTRAMUSCULAR

## 2018-08-22 NOTE — Assessment & Plan Note (Signed)
Recently evaluated by Urology, in remission

## 2018-08-22 NOTE — Assessment & Plan Note (Signed)
After obtaining informed consent, the vaccine is  administered by LPN.  

## 2018-08-22 NOTE — Patient Instructions (Addendum)
Physical exam with MD in 2 to  3 months and for Zostavax # 2, call if you need me sooner  Zostavax #1 administered at visit  Labs today, lipid, cmp and EGFr, TSH, vit D  Toradol 60 mg IM and depo medrol 80 mg im in office today for back and bilateral lower extremity pain, and medication sent in for next 1 week, ibuprofen and prednisone   Xr ay of left knee today, and senmd message if you need orhto if knnee persits  It is important that you exercise regularly at least 30 minutes 5 times a week. If you develop chest pain, have severe difficulty breathing, or feel very tired, stop exercising immediately and seek medical attention   Please work on good  health habits so that your health will improve. 1. Commitment to daily physical activity for 30 to 60  minutes, if you are able to do this.  2. Commitment to wise food choices. Aim for half of your  food intake to be vegetable and fruit, one quarter starchy foods, and one quarter protein. Try to eat on a regular schedule  3 meals per day, snacking between meals should be limited to vegetables or fruits or small portions of nuts. 64 ounces of water per day is generally recommended, unless you have specific health conditions, like heart failure or kidney failure where you will need to limit fluid intake.  3. Commitment to sufficient and a  good quality of physical and mental rest daily, generally between 6 to 8 hours per day.  WITH PERSISTANCE AND PERSEVERANCE, THE IMPOSSIBLE , BECOMES THE NORM!

## 2018-08-22 NOTE — Progress Notes (Signed)
Anthony Erickson     MRN: [id number]      DOB: 12/03/1960   HPI Mr. Sawyers is here for follow up and re-evaluation of chronic medical conditions, medication management and review of any available recent lab and radiology data.  Preventive health is updated, specifically  Cancer screening and Immunization.   Questions or concerns regarding consultations or procedures which the PT has had in the interim are  addressed. The PT denies any adverse reactions to current medications since the last visit.  C/o uncontrolled LBP works 9 hrs / day 5 days per week, rated at a 10 x 5 months, had epidural in the past which helped Left knee pain rated at an 8 esp climbing stairs, has some instability in the knee also x 3 dayas  ROS Denies recent fever or chills. Denies sinus pressure, nasal congestion, ear pain or sore throat. Denies chest congestion, productive cough or wheezing. Denies chest pains, palpitations and leg swelling Denies abdominal pain, nausea, vomiting,diarrhea or constipation.   Denies dysuria, frequency, hesitancy or incontinence.  Denies headaches, seizures, numbness, or tingling. Denies depression, anxiety or insomnia. Denies skin break down or rash.   PE  BP 118/64 (BP Location: Left Arm, Patient Position: Sitting, Cuff Size: Large)   Pulse 98   Resp 12   Ht 5\' 6"  (1.676 m)   Wt 238 lb 0.6 oz (108 kg)   SpO2 97% Comment: room air  BMI 38.42 kg/m   Patient alert and oriented and in no cardiopulmonary distress.  HEENT: No facial asymmetry, EOMI,   oropharynx pink and moist.  Neck supple no JVD, no mass.  Chest: Clear to auscultation bilaterally.  CVS: S1, S2 no murmurs, no S3.Regular rate.  ABD: Soft non tender.   Ext: No edema  MS: Decreased  ROM lumbar spine,adequate in shoulders, hips and reduced in left knee with point tenderness on lateral and on medial aspects.  Skin: Intact, no ulcerations or rash noted.  Psych: Good eye contact, normal affect. Memory  intact not anxious or depressed appearing.  CNS: CN 2-12 intact, power,  normal throughout.no focal deficits noted.   Assessment & Plan  Left anterior knee pain Left knee pain and instability x 3 days, Xray to further assess , no known trauma  Low back pain without sciatica Uncontrolled.Toradol and depo medrol administered IM in the office , to be followed by a short course of oral prednisone and NSAIDS. Will call back if persists for referral for epidural  Obesity (BMI 35.0-39.9 without comorbidity) Deteriorated. Patient re-educated about  the importance of commitment to a  minimum of 150 minutes of exercise per week.  The importance of healthy food choices with portion control discussed. Encouraged to start a food diary, count calories and to consider  joining a support group. Sample diet sheets offered. Goals set by the patient for the next several months.   Weight /BMI 08/22/2018 12/15/2017 11/10/2017  WEIGHT 238 lb 0.6 oz 224 lb 229 lb 6.4 oz  HEIGHT 5\' 6"  5\' 6"  5\' 6"   BMI 38.42 kg/m2 36.15 kg/m2 37.03 kg/m2      Need for shingles vaccine After obtaining informed consent, the vaccine is  administered by LPN.   Adenocarcinoma of prostate Recently evaluated by Urology, in remission  Dyslipidemia Hyperlipidemia:Low fat diet discussed and encouraged.   Lipid Panel  Lab Results  Component Value Date   CHOL 142 12/31/2016   HDL 37 (L) 12/31/2016   LDLCALC 86 12/31/2016   TRIG 93  12/31/2016   CHOLHDL 3.8 12/31/2016   Updated lab needed

## 2018-08-22 NOTE — Assessment & Plan Note (Signed)
Deteriorated. Patient re-educated about  the importance of commitment to a  minimum of 150 minutes of exercise per week.  The importance of healthy food choices with portion control discussed. Encouraged to start a food diary, count calories and to consider  joining a support group. Sample diet sheets offered. Goals set by the patient for the next several months.   Weight /BMI 08/22/2018 12/15/2017 11/10/2017  WEIGHT 238 lb 0.6 oz 224 lb 229 lb 6.4 oz  HEIGHT 5\' 6"  5\' 6"  5\' 6"   BMI 38.42 kg/m2 36.15 kg/m2 37.03 kg/m2

## 2018-08-22 NOTE — Assessment & Plan Note (Signed)
Hyperlipidemia:Low fat diet discussed and encouraged.   Lipid Panel  Lab Results  Component Value Date   CHOL 142 12/31/2016   HDL 37 (L) 12/31/2016   LDLCALC 86 12/31/2016   TRIG 93 12/31/2016   CHOLHDL 3.8 12/31/2016   Updated lab needed

## 2018-08-22 NOTE — Assessment & Plan Note (Signed)
Uncontrolled.Toradol and depo medrol administered IM in the office , to be followed by a short course of oral prednisone and NSAIDS. Will call back if persists for referral for epidural

## 2018-08-22 NOTE — Assessment & Plan Note (Addendum)
Left knee pain and instability x 3 days, Xray to further assess , no known trauma

## 2018-08-23 ENCOUNTER — Telehealth: Payer: Self-pay

## 2018-08-23 NOTE — Telephone Encounter (Signed)
I have already reviewed them since yesterday and sent a msg to a nurse to follow up, please look for the message and take over from there since you received the message. pls put urgent msgs on my desk as well, thanks

## 2018-08-23 NOTE — Telephone Encounter (Signed)
CALL REPORT- from Opal Sidles at [facility] radiology. Called to report patient left knee xray is abnormal and bone scan recommended. Results given immediately to Dr Moshe Cipro for review. 8:20am

## 2018-08-24 NOTE — Telephone Encounter (Signed)
Attempted to contact pt this am and had to leave a voicemail to call back or stop by the office if he was in the area. He has not read the [employer]

## 2018-08-25 ENCOUNTER — Telehealth: Payer: Self-pay | Admitting: Family Medicine

## 2018-08-25 NOTE — Telephone Encounter (Signed)
Spoke with patient and read to him the [place] message that Dr Moshe Cipro sent to him and the report was faxed to [facility] and they are aware and confirmed receipt

## 2018-08-25 NOTE — Telephone Encounter (Signed)
Left  message for him to contact office/ nurse re X ray report, pls also mail a letter to him today if no response

## 2018-08-25 NOTE — Telephone Encounter (Signed)
Spoke with patient face to face in office and he is aware of abnormal imagiing and that it was being faxed to his urologist for further review due to his history of prostate cancer

## 2018-09-01 ENCOUNTER — Telehealth: Payer: Self-pay | Admitting: Family Medicine

## 2018-09-01 NOTE — Telephone Encounter (Signed)
Please contact pt and let him know that Dr  Roni Bread, his Urologist  , was in touch with me directly about the abnormal x ray of the knee, he does not believe that this is related to his prostate since that checked out extremely well  I spoke with the pt directly, he is aware , of Dr renn's response, I have referred him to Dr Aline Brochure still has intermittent knee pain

## 2018-09-13 ENCOUNTER — Other Ambulatory Visit: Payer: Self-pay | Admitting: Family Medicine

## 2018-09-13 DIAGNOSIS — M25562 Pain in left knee: Principal | ICD-10-CM

## 2018-09-13 DIAGNOSIS — G8929 Other chronic pain: Secondary | ICD-10-CM

## 2018-10-24 ENCOUNTER — Ambulatory Visit (INDEPENDENT_AMBULATORY_CARE_PROVIDER_SITE_OTHER): Payer: 59 | Admitting: Orthopedic Surgery

## 2018-10-24 ENCOUNTER — Ambulatory Visit (INDEPENDENT_AMBULATORY_CARE_PROVIDER_SITE_OTHER): Payer: 59

## 2018-10-24 ENCOUNTER — Encounter: Payer: Self-pay | Admitting: Orthopedic Surgery

## 2018-10-24 VITALS — BP 129/81 | HR 98 | Ht 66.0 in | Wt 235.0 lb

## 2018-10-24 DIAGNOSIS — M1710 Unilateral primary osteoarthritis, unspecified knee: Secondary | ICD-10-CM

## 2018-10-24 DIAGNOSIS — G8929 Other chronic pain: Secondary | ICD-10-CM

## 2018-10-24 DIAGNOSIS — M2242 Chondromalacia patellae, left knee: Secondary | ICD-10-CM

## 2018-10-24 DIAGNOSIS — M25562 Pain in left knee: Secondary | ICD-10-CM

## 2018-10-24 NOTE — Progress Notes (Signed)
NEW PATIENT OFFICE VISIT  Chief Complaint  Patient presents with  . Knee Pain    Left knee 6 months    My knee hurts and feels like it is going to give way when I go up the steps  57 year old male history of left knee pain for 6 months no injury.  This came on gradually and seems to bother him when he steps up on his steps with his left leg with dull aching pain in the front of the knee.  No previous treatment.  Giving way symptoms only on stairclimbing.   Review of Systems  All other systems reviewed and are negative.    Past Medical History:  Diagnosis Date  . Cancer ([place])   . Condyloma acuminatum   . GERD (gastroesophageal reflux disease)   . Hyperlipidemia   . Obesity   . Prostate cancer ([place])   . Tendinitis    Of left hand     Past Surgical History:  Procedure Laterality Date  . APPENDECTOMY  12/13/2009  . CHEILECTOMY Right 01/24/2014   Procedure: CHEILECTOMY;  Surgeon: Carole Civil, MD;  Location: [facility];  Service: Orthopedics;  Laterality: Right;  . CHOLECYSTECTOMY  08/15/2010   Dr. Cornelia Copa  . COLONOSCOPY N/A 12/15/2017   Procedure: COLONOSCOPY;  Surgeon: Rogene Houston, MD;  Location: [facility];  Service: Endoscopy;  Laterality: N/A;  1:45  . ctr right hand    . POLYPECTOMY  12/15/2017   Procedure: POLYPECTOMY;  Surgeon: Rogene Houston, MD;  Location: [facility];  Service: Endoscopy;;  colon  . prostate seed implant     2015    Family History  Problem Relation Age of Onset  . Hypertension Mother   . Diabetes Mother   . Hyperlipidemia Mother   . GER disease Father   . Hypertension Father   . Hyperlipidemia Father   . Diabetes Sister   . Hypertension Sister   . Cancer Sister        ovarian   . Diabetes Unknown        Family History   . Cancer Unknown        Family History    Social History   Tobacco Use  . Smoking status: Never Smoker  . Smokeless tobacco: Never Used  Substance Use Topics  . Alcohol use: No    Comment:  Occasional   . Drug use: No    No Known Allergies  Current Meds  Medication Sig  . Cholecalciferol (VITAMIN D3) 1000 units CAPS Take 1,000 Units by mouth daily.  . Multiple Vitamin (MULTI VITAMIN MENS PO) Take 1 tablet by mouth daily.     BP 129/81   Pulse 98   Ht 5\' 6"  (1.676 m)   Wt 235 lb (106.6 kg)   BMI 37.93 kg/m   Physical Exam Vitals signs reviewed.  Constitutional:      Appearance: He is well-developed.     Comments: Vital signs have been reviewed and are stable. Gen. appearance the patient is well-developed and well-nourished with normal grooming and hygiene.   Musculoskeletal:     Right knee: He exhibits no effusion.     Left knee: He exhibits no effusion.  Skin:    General: Skin is warm and dry.     Findings: No erythema.  Neurological:     Mental Status: He is alert and oriented to person, place, and time.     Gait: Gait and tandem walk normal.  Right Knee Exam   Muscle Strength  The patient has normal right knee strength.  Tenderness  The patient is experiencing no tenderness.   Range of Motion  Extension: normal  Flexion: normal   Tests  Mc[place]ray:  Medial - negative Lateral - negative Varus: negative Valgus: negative Drawer:  Anterior - negative    Posterior - negative  Other  Erythema: absent Scars: absent Sensation: normal Pulse: present Swelling: none Effusion: no effusion present  Comments:  Right hip motion normal   Left Knee Exam   Muscle Strength  The patient has normal left knee strength.  Tenderness  The patient is experiencing no tenderness.   Range of Motion  Extension: normal  Flexion: normal   Tests  McMurray:  Medial - negative Lateral - negative Varus: negative Valgus: negative Drawer:  Anterior - negative     Posterior - negative Patellar apprehension: negative  Other  Erythema: absent Scars: absent Sensation: normal Pulse: present Swelling: none Effusion: no effusion present  Comments:   Positive quadriceps contraction test for patellar pain with crepitance and pain on patellar motion no excessive motion medially laterally tenderness in the peripatellar region  Normal range of motion in the hip        MEDICAL DECISION SECTION  Xrays were done at [facility] took 4 views of his knee [facility] internal/external oblique and lateral those x-rays show a moderate amount of arthritis of the joint medially and laterally  I took a sunrise view of the knee because most of his pain was in the patella region and it showed  Normal axial alignment with mild osteophytes peripherally  Encounter Diagnoses  Name Primary?  . Chronic pain of left knee   . Primary localized osteoarthritis of knee left knee   . Chondromalacia patellae, left Yes    PLAN: (Rx., injectx, surgery, frx, mri/ct) Quadricep strengthening Activity modification to avoid stair climbing or lead with the opposite leg Also offered injection and anti-inflammatory medication   Procedure note left knee injection verbal consent was obtained to inject left knee joint  Timeout was completed to confirm the site of injection  The medications used were 40 mg of Depo-Medrol and 1% lidocaine 3 cc  Anesthesia was provided by ethyl chloride and the skin was prepped with alcohol.  After cleaning the skin with alcohol a 20-gauge needle was used to inject the left knee joint. There were no complications. A sterile bandage was applied.   No orders of the defined types were placed in this encounter.   Arther Abbott, MD  10/24/2018 11:29 AM

## 2018-10-24 NOTE — Patient Instructions (Signed)
Watch your weight try to lose 10 lbs  Avoid steps and try to lead with the left leg and climb one at a time  Start quadriceps exercises

## 2018-11-15 ENCOUNTER — Encounter: Payer: 59 | Admitting: Family Medicine

## 2018-12-08 ENCOUNTER — Telehealth: Payer: Self-pay | Admitting: *Deleted

## 2018-12-08 NOTE — Telephone Encounter (Signed)
FMLA received via fax Noted in Epic  Copied Sleeved

## 2018-12-19 ENCOUNTER — Encounter: Payer: 59 | Admitting: Family Medicine

## 2019-01-23 ENCOUNTER — Other Ambulatory Visit: Payer: Self-pay

## 2019-01-23 ENCOUNTER — Ambulatory Visit (INDEPENDENT_AMBULATORY_CARE_PROVIDER_SITE_OTHER): Payer: 59

## 2019-01-23 ENCOUNTER — Encounter: Payer: 59 | Admitting: Family Medicine

## 2019-01-23 DIAGNOSIS — Z23 Encounter for immunization: Secondary | ICD-10-CM | POA: Diagnosis not present

## 2019-01-23 MED ORDER — GABAPENTIN 400 MG PO CAPS: 400.0000 mg | ORAL_CAPSULE | Freq: Every day | ORAL | 3 refills | Status: DC

## 2019-01-23 NOTE — Progress Notes (Signed)
2nd shingrix given in left deltoid with no complications.

## 2019-02-17 IMAGING — DX DG KNEE COMPLETE 4+V*L*
4 series · 4 of 4 positions shown · non-contrast
Comparison: None.

CLINICAL DATA: 57-year-old male with anterior left knee pain for 3
days. No known injury. Initial encounter.

EXAM:
LEFT KNEE - COMPLETE 4+ VIEW

[knee ap]
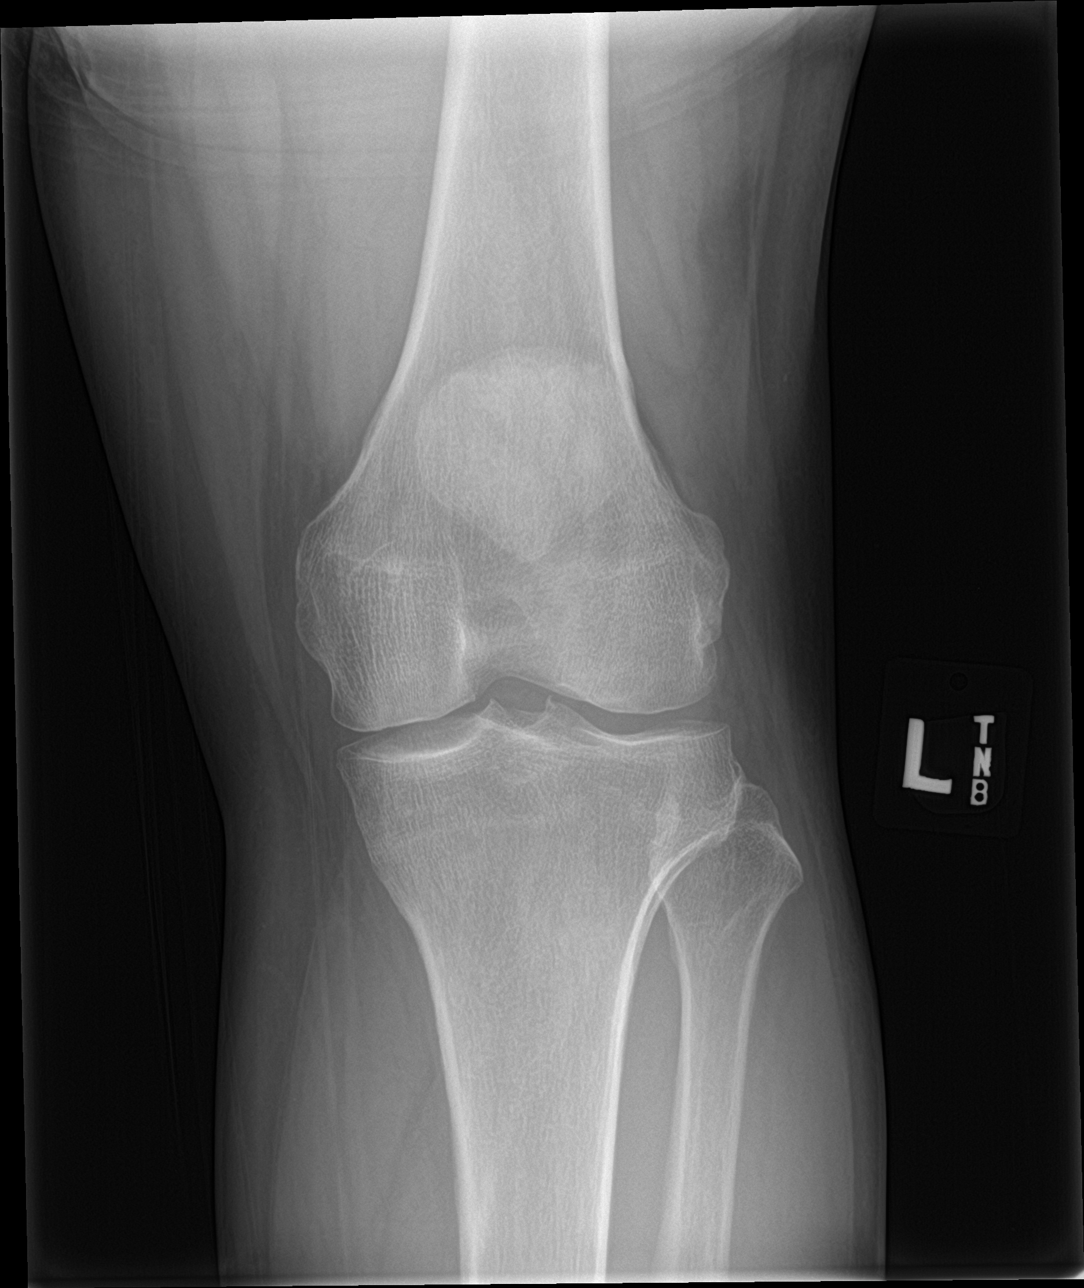

[tunnel]
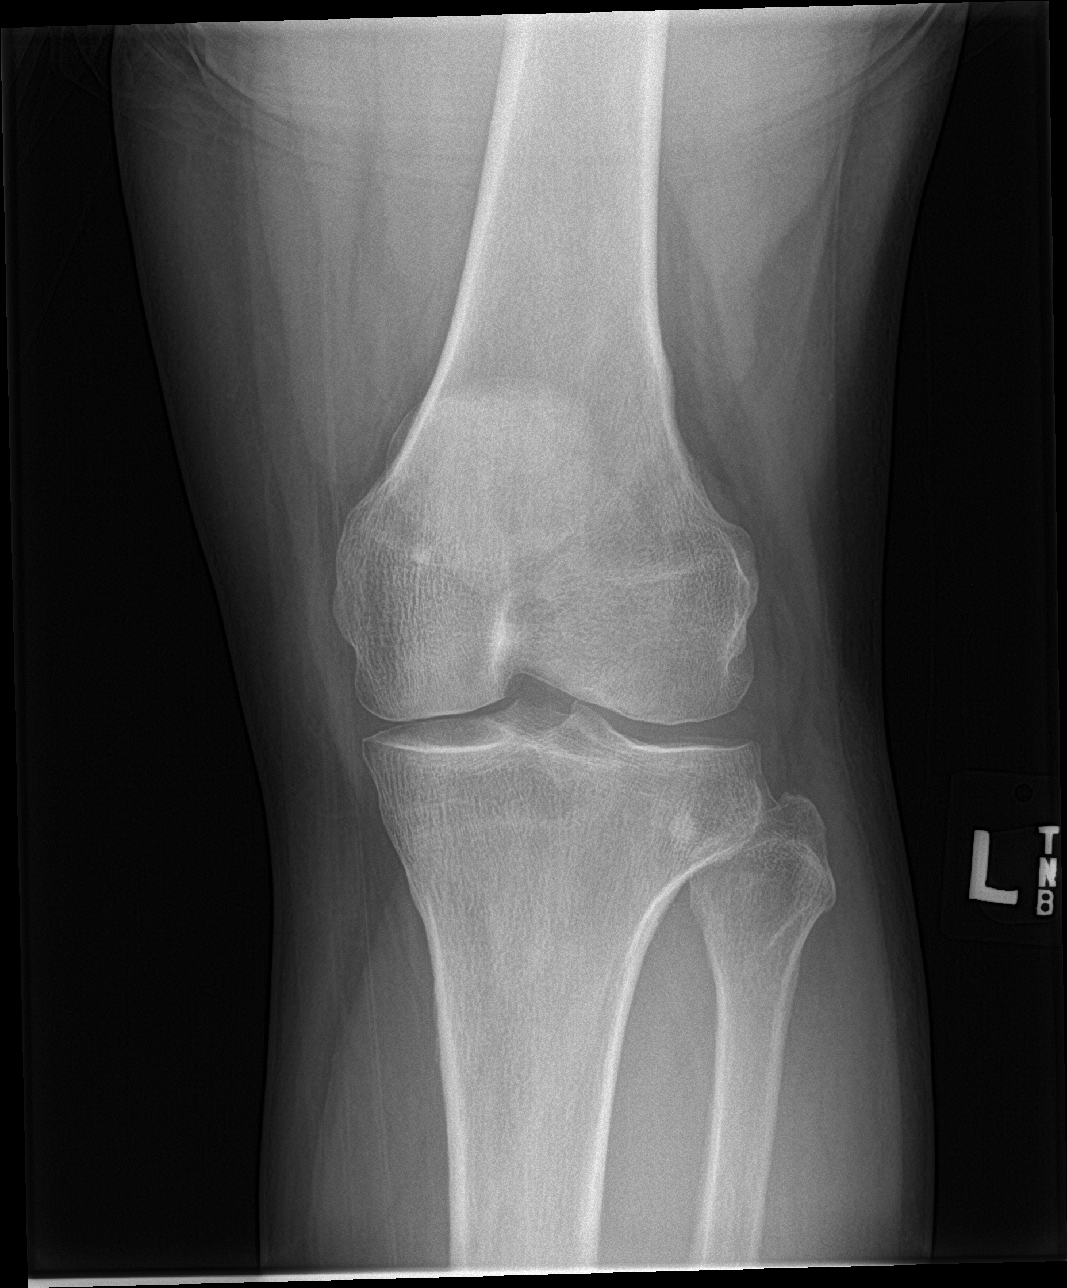

[knee lat]
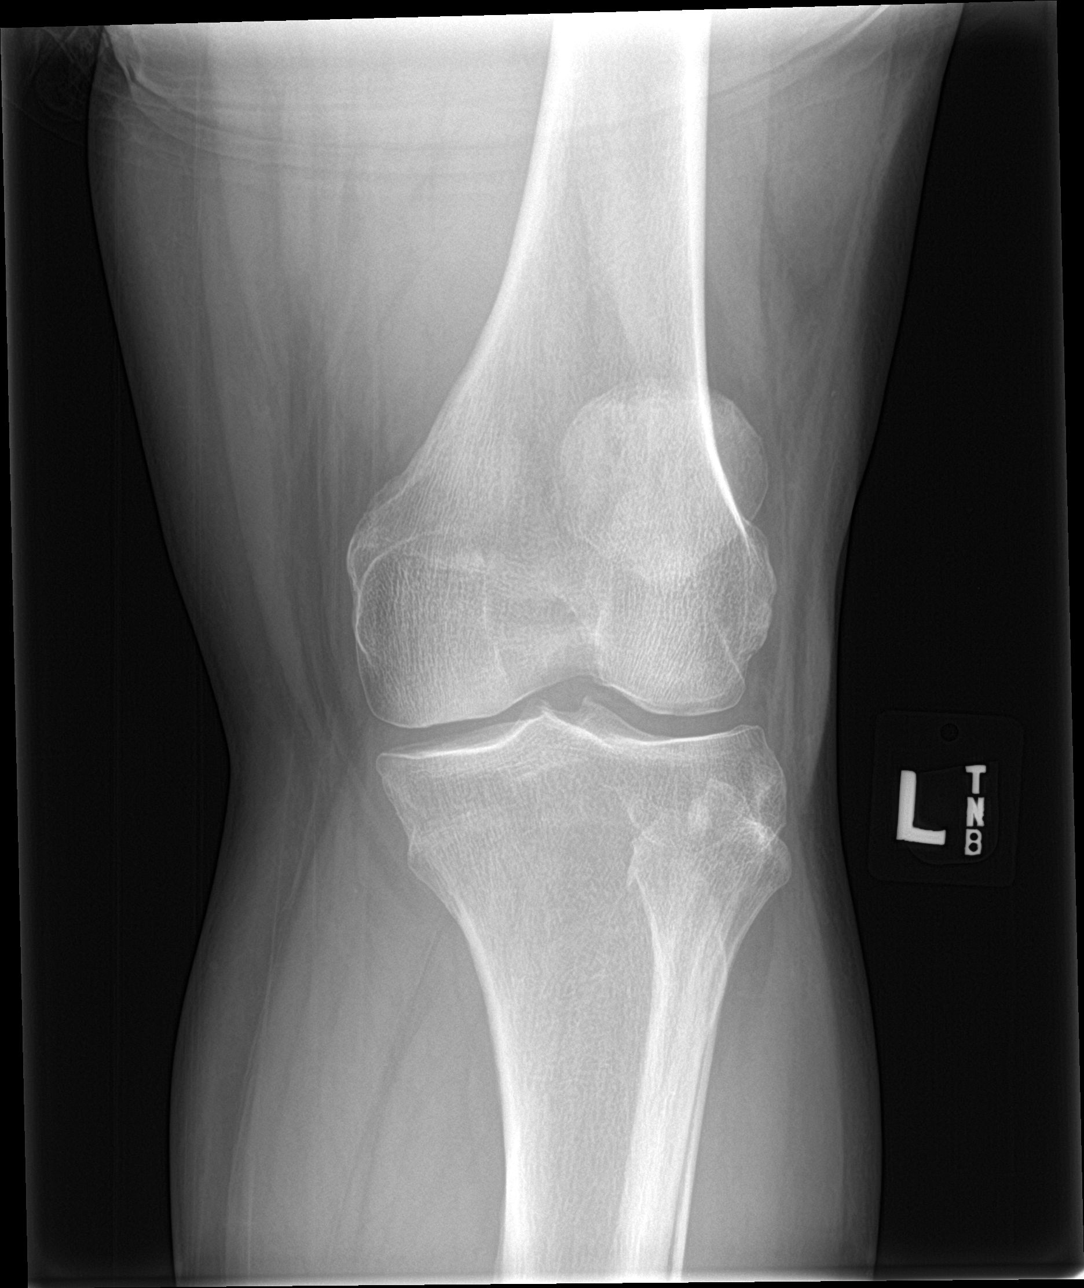

[knee sunrise]
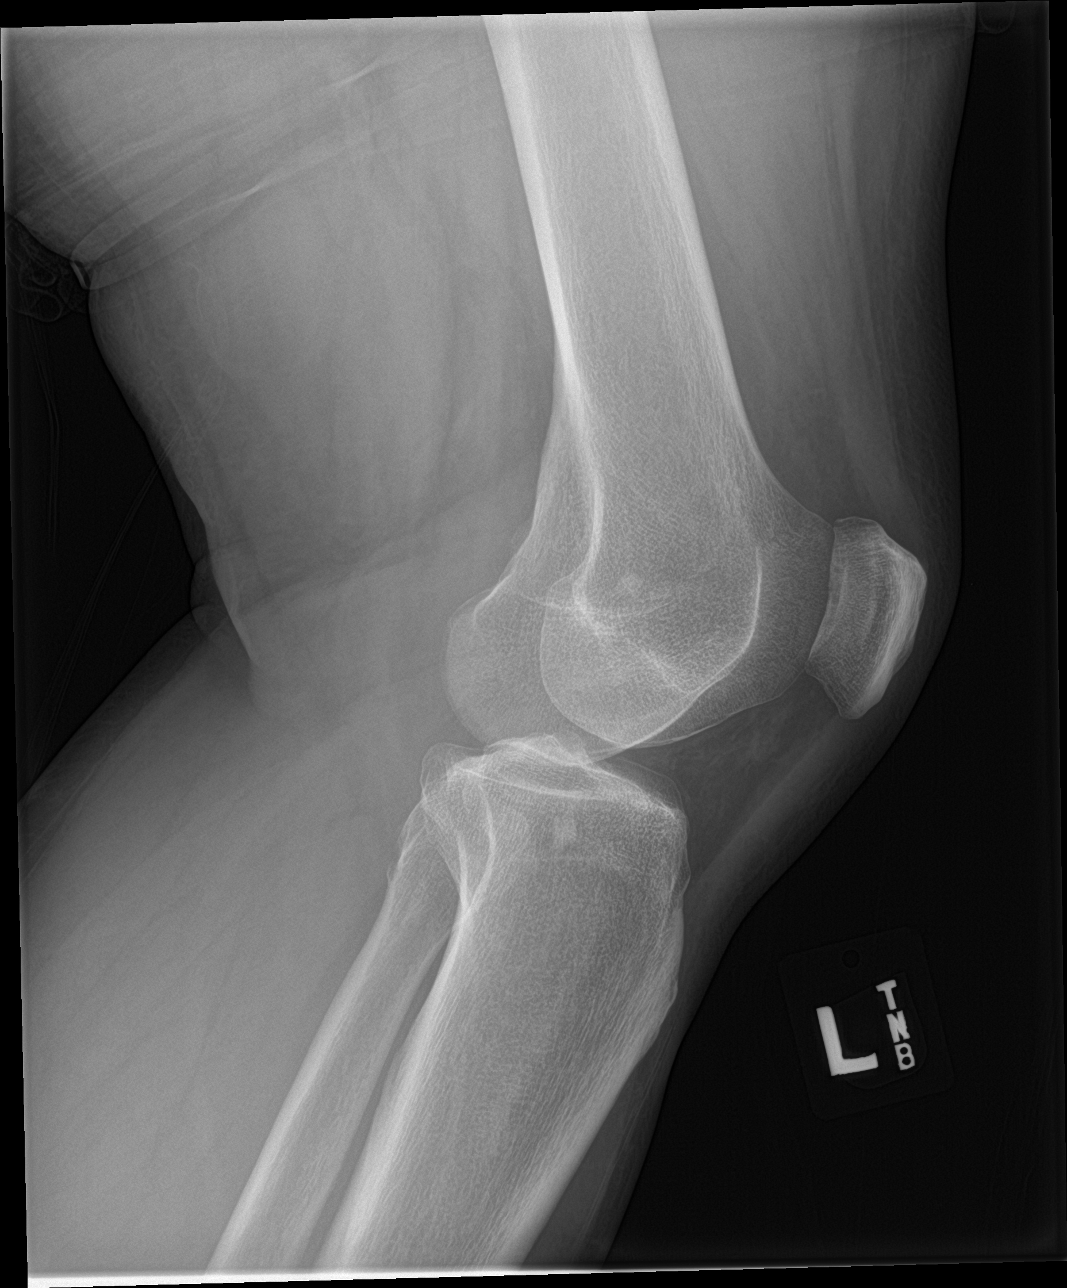

[4 of 4 positions shown; findings below may reference images not displayed]

FINDINGS: No fracture or dislocation. No significant joint space narrowing. No
evidence of joint effusion. Subcentimeter sclerotic focus left
lateral tibial plateau and left medial femoral condyle.
IMPRESSION: 1. Subcentimeter sclerotic focus left lateral tibial plateau and
left medial femoral condyle. Patient has a history of prostate
cancer and therefore sclerotic metastatic disease is a possibility.
Patient may benefit from bone scan.
2. Otherwise negative.

These results will be called to the ordering clinician or
representative by the Radiologist Assistant, and communication
documented in the PACS or zVision Dashboard..

## 2019-03-21 ENCOUNTER — Ambulatory Visit (INDEPENDENT_AMBULATORY_CARE_PROVIDER_SITE_OTHER): Payer: 59

## 2019-03-21 ENCOUNTER — Encounter: Payer: Self-pay | Admitting: Family Medicine

## 2019-03-21 ENCOUNTER — Ambulatory Visit (INDEPENDENT_AMBULATORY_CARE_PROVIDER_SITE_OTHER): Payer: 59 | Admitting: Family Medicine

## 2019-03-21 VITALS — BP 129/81 | Ht 66.0 in | Wt 215.0 lb

## 2019-03-21 DIAGNOSIS — M545 Low back pain, unspecified: Secondary | ICD-10-CM

## 2019-03-21 DIAGNOSIS — E785 Hyperlipidemia, unspecified: Secondary | ICD-10-CM | POA: Diagnosis not present

## 2019-03-21 DIAGNOSIS — M79605 Pain in left leg: Secondary | ICD-10-CM

## 2019-03-21 DIAGNOSIS — G4709 Other insomnia: Secondary | ICD-10-CM | POA: Diagnosis not present

## 2019-03-21 DIAGNOSIS — E559 Vitamin D deficiency, unspecified: Secondary | ICD-10-CM

## 2019-03-21 DIAGNOSIS — E669 Obesity, unspecified: Secondary | ICD-10-CM | POA: Diagnosis not present

## 2019-03-21 DIAGNOSIS — M79604 Pain in right leg: Secondary | ICD-10-CM

## 2019-03-21 MED ORDER — PANTOPRAZOLE SODIUM 40 MG PO TBEC: DELAYED_RELEASE_TABLET | ORAL | 3 refills | Status: DC

## 2019-03-21 MED ORDER — GABAPENTIN 400 MG PO CAPS: ORAL_CAPSULE | ORAL | 1 refills | Status: DC

## 2019-03-21 MED ORDER — METHYLPREDNISOLONE ACETATE 80 MG/ML IJ SUSP
80.0000 mg | Freq: Once | INTRAMUSCULAR | Status: AC
  Administered 2019-03-21: 80 mg via INTRAMUSCULAR

## 2019-03-21 MED ORDER — KETOROLAC TROMETHAMINE 60 MG/2ML IM SOLN
60.0000 mg | Freq: Once | INTRAMUSCULAR | Status: AC
  Administered 2019-03-21: 60 mg via INTRAMUSCULAR

## 2019-03-21 MED ORDER — IBUPROFEN 600 MG PO TABS: ORAL_TABLET | ORAL | 0 refills | Status: DC

## 2019-03-21 MED ORDER — PREDNISONE 10 MG (21) PO TBPK: ORAL_TABLET | ORAL | 0 refills | Status: DC

## 2019-03-21 MED FILL — predniSONE 10 MG (21) TBPK: 10 | 6 days supply | Qty: 21 | Fill #0

## 2019-03-21 MED FILL — PANTOPRAZOLE SOD DR 40 MG T: 40 | 60 days supply | Qty: 60 | Fill #0

## 2019-03-21 MED FILL — IBUPROFEN 600 MG TABLET: 600 | 10 days supply | Qty: 30 | Fill #0

## 2019-03-21 NOTE — Progress Notes (Signed)
Virtual Visit via Telephone Note  I connected with Anthony Erickson on 03/21/19 at  3:20 PM EDT by telephone and verified that I am speaking with the correct person using two identifiers.  Location: Patient: home , however came into the office following phone visit on he same day for iM injections  Provider: office   I discussed the limitations, risks, security and privacy concerns of performing an evaluation and management service by telephone and the availability of in person appointments. I also discussed with the patient that there may be a patient responsible charge related to this service. The patient expressed understanding and agreed to proceed.   History of Present Illness: Uncontrolled back pain radiating down both legs x 3 months, rated between 8 and 10, disturbsmhis sleep and limits his ability to function Denies lower extremity weakness and  Numbness, denies incontinence Denies recent fever or chills. Denies sinus pressure, nasal congestion, ear pain or sore throat. Denies chest congestion, productive cough or wheezing. Denies chest pains, palpitations and leg swelling Denies abdominal pain, nausea, vomiting,diarrhea or constipation.   Denies dysuria, frequency, hesitancy or incontinence.  Denies headaches, seizures, numbness, or tingling. Denies depression, anxiety or insomnia. Denies skin break down or rash.       Observations/Objective:  BP 129/81   Ht 5\' 6"  (1.676 m)   Wt 215 lb (97.5 kg)   BMI 34.70 kg/m  Good communication with no confusion and intact memory. Alert and oriented x 3 No signs of respiratory distress during speech, pt in pain   Assessment and Plan: Lumbar pain with radiation down both legs 3 month h/o increased and uncontrolled pain Uncontrolled.Toradol and depo medrol administered IM in the office , to be followed by a short course of oral prednisone and NSAIDS.   Obesity (BMI 35.0-39.9 without comorbidity)  Patient re-educated about   the importance of commitment to a  minimum of 150 minutes of exercise per week as able.  The importance of healthy food choices with portion control discussed, as well as eating regularly and within a 12 hour window most days. The need to choose "clean , green" food 50 to 75% of the time is discussed, as well as to make water the primary drink and set a goal of 64 ounces water daily.  Encouraged to start a food diary,  and to consider  joining a support group. Sample diet sheets offered. Goals set by the patient for the next several months.   Weight /BMI 03/21/2019 10/24/2018 08/22/2018  WEIGHT 215 lb 235 lb 238 lb 0.6 oz  HEIGHT 5\' 6"  5\' 6"  5\' 6"   BMI 34.7 kg/m2 37.93 kg/m2 38.42 kg/m2      Dyslipidemia Hyperlipidemia:Low fat diet discussed and encouraged.   Lipid Panel  Lab Results  Component Value Date   CHOL 142 12/31/2016   HDL 37 (L) 12/31/2016   LDLCALC 86 12/31/2016   TRIG 93 12/31/2016   CHOLHDL 3.8 12/31/2016       Insomnia disorder Sleep hygiene reviewed and written information offered also. Prescription sent for  medication needed.     Follow Up Instructions:    I discussed the assessment and treatment plan with the patient. The patient was provided an opportunity to ask questions and all were answered. The patient agreed with the plan and demonstrated an understanding of the instructions.   The patient was advised to call back or seek an in-person evaluation if the symptoms worsen or if the condition fails to improve as anticipated.  I provided 24 minutes of non-face-to-face time during this encounter.   Tula Nakayama, MD

## 2019-03-21 NOTE — Patient Instructions (Addendum)
Annual physical exam in 6  Weeks and review of chronic back pain, call if you need me sooner, also shingrix #1 to be offered at visit  Toradol 60 mg IM and depo medrol 80 mg IM in office today for pain, and ibuprofen, prednisone are prescribed, also increase gabapentin to two at bedtime   Fasting labs are past due, please get these 1 week before next appointment , you have not had labs since 2018!! Needs Fasting CBC, lipid, cmp and eGFR , TSH and vit d level  Please get  An X ray of your low back this week, ordered for [facility] radiology dept  Social distancing. Frequent hand washing with soap and water Keeping your hands off of your face. These 3 practices will help to keep both you and your community healthy during this time. Please practice them faithfully!   exercise Think about what you will eat, plan ahead. Choose " clean, green, fresh or frozen" over canned, processed or packaged foods which are more sugary, salty and fatty. 70 to 75% of food eaten should be vegetables and fruit. Three meals at set times with snacks allowed between meals, but they must be fruit or vegetables. Aim to eat over a 12 hour period , example 7 am to 7 pm, and STOP after  your last meal of the day. Drink water,generally about 64 ounces per day, no other drink is as healthy. Fruit juice is best enjoyed in a healthy way, by EATING the fruit.

## 2019-03-21 NOTE — Assessment & Plan Note (Addendum)
3 month h/o increased and uncontrolled pain Uncontrolled.Toradol and depo medrol administered IM in the office , to be followed by a short course of oral prednisone and NSAIDS.

## 2019-03-21 NOTE — Progress Notes (Signed)
Patient came in today for depo medrol injection and toradol injection per Dr.Simpson, both given in upper left outer quad per his request, with no issues. Patient encouraged to call with any questions or concerns.

## 2019-03-22 ENCOUNTER — Other Ambulatory Visit: Payer: Self-pay

## 2019-03-22 ENCOUNTER — Ambulatory Visit (HOSPITAL_COMMUNITY)
Admission: RE | Admit: 2019-03-22 | Discharge: 2019-03-22 | Disposition: A | Discharge status: Home / Self Care | Payer: 59 | Source: Ambulatory Visit | Attending: Family Medicine | Admitting: Family Medicine

## 2019-03-22 DIAGNOSIS — M545 Low back pain: Secondary | ICD-10-CM | POA: Diagnosis not present

## 2019-03-22 DIAGNOSIS — M79604 Pain in right leg: Secondary | ICD-10-CM

## 2019-03-22 DIAGNOSIS — M79605 Pain in left leg: Secondary | ICD-10-CM

## 2019-03-26 ENCOUNTER — Encounter: Payer: Self-pay | Admitting: Family Medicine

## 2019-03-26 NOTE — Assessment & Plan Note (Signed)
Hyperlipidemia:Low fat diet discussed and encouraged.   Lipid Panel  Lab Results  Component Value Date   CHOL 142 12/31/2016   HDL 37 (L) 12/31/2016   LDLCALC 86 12/31/2016   TRIG 93 12/31/2016   CHOLHDL 3.8 12/31/2016

## 2019-03-26 NOTE — Assessment & Plan Note (Signed)
  Patient re-educated about  the importance of commitment to a  minimum of 150 minutes of exercise per week as able.  The importance of healthy food choices with portion control discussed, as well as eating regularly and within a 12 hour window most days. The need to choose "clean , green" food 50 to 75% of the time is discussed, as well as to make water the primary drink and set a goal of 64 ounces water daily.  Encouraged to start a food diary,  and to consider  joining a support group. Sample diet sheets offered. Goals set by the patient for the next several months.   Weight /BMI 03/21/2019 10/24/2018 08/22/2018  WEIGHT 215 lb 235 lb 238 lb 0.6 oz  HEIGHT 5\' 6"  5\' 6"  5\' 6"   BMI 34.7 kg/m2 37.93 kg/m2 38.42 kg/m2

## 2019-03-26 NOTE — Assessment & Plan Note (Signed)
Sleep hygiene reviewed and written information offered also. Prescription sent for  medication needed.  

## 2019-05-17 DIAGNOSIS — E559 Vitamin D deficiency, unspecified: Secondary | ICD-10-CM | POA: Diagnosis not present

## 2019-05-17 DIAGNOSIS — E785 Hyperlipidemia, unspecified: Secondary | ICD-10-CM | POA: Diagnosis not present

## 2019-05-17 DIAGNOSIS — E669 Obesity, unspecified: Secondary | ICD-10-CM | POA: Diagnosis not present

## 2019-05-18 LAB — CBC
HCT: 42.5 % (ref 38.5–50.0)
Hemoglobin: 14.6 g/dL (ref 13.2–17.1)
MCH: 32.1 pg (ref 27.0–33.0)
MCHC: 34.4 g/dL (ref 32.0–36.0)
MCV: 93.4 fL (ref 80.0–100.0)
MPV: 11.6 fL (ref 7.5–12.5)
Platelets: 241 10*3/uL (ref 140–400)
RBC: 4.55 10*6/uL (ref 4.20–5.80)
RDW: 12.7 % (ref 11.0–15.0)
WBC: 5.7 10*3/uL (ref 3.8–10.8)

## 2019-05-18 LAB — COMPLETE METABOLIC PANEL WITH GFR
AG Ratio: 1.5 (calc) (ref 1.0–2.5)
ALT: 25 U/L (ref 9–46)
AST: 20 U/L (ref 10–35)
Albumin: 4.3 g/dL (ref 3.6–5.1)
Alkaline phosphatase (APISO): 80 U/L (ref 35–144)
BUN: 16 mg/dL (ref 7–25)
CO2: 28 mmol/L (ref 20–32)
Calcium: 9.2 mg/dL (ref 8.6–10.3)
Chloride: 108 mmol/L (ref 98–110)
Creat: 1.11 mg/dL (ref 0.70–1.33)
GFR, Est African American: 85 mL/min/{1.73_m2} (ref >=60)
GFR, Est Non African American: 73 mL/min/{1.73_m2} (ref >=60)
Globulin: 2.8 g/dL (calc) (ref 1.9–3.7)
Glucose, Bld: 86 mg/dL (ref 65–99)
Potassium: 4.3 mmol/L (ref 3.5–5.3)
Sodium: 141 mmol/L (ref 135–146)
Total Bilirubin: 0.5 mg/dL (ref 0.2–1.2)
Total Protein: 7.1 g/dL (ref 6.1–8.1)

## 2019-05-18 LAB — LIPID PANEL
Cholesterol: 170 mg/dL (ref <200)
HDL: 37 mg/dL — ABNORMAL LOW (ref >=40)
LDL Cholesterol (Calc): 101 mg/dL (calc) — ABNORMAL HIGH
Non-HDL Cholesterol (Calc): 133 mg/dL (calc) — ABNORMAL HIGH (ref <130)
Total CHOL/HDL Ratio: 4.6 (calc) (ref <5.0)
Triglycerides: 205 mg/dL — ABNORMAL HIGH (ref <150)

## 2019-05-18 LAB — VITAMIN D 25 HYDROXY (VIT D DEFICIENCY, FRACTURES): Vit D, 25-Hydroxy: 14 ng/mL — ABNORMAL LOW (ref 30–100)

## 2019-05-18 LAB — TSH: TSH: 0.98 mIU/L (ref 0.40–4.50)

## 2019-05-21 ENCOUNTER — Encounter: Payer: Self-pay | Admitting: Family Medicine

## 2019-05-24 ENCOUNTER — Encounter: Payer: Self-pay | Admitting: Family Medicine

## 2019-05-24 ENCOUNTER — Other Ambulatory Visit: Payer: Self-pay

## 2019-05-24 ENCOUNTER — Ambulatory Visit (INDEPENDENT_AMBULATORY_CARE_PROVIDER_SITE_OTHER): Payer: 59 | Admitting: Family Medicine

## 2019-05-24 VITALS — BP 114/82 | HR 95 | Temp 97.3°F | Resp 16 | Ht 66.0 in | Wt 235.0 lb

## 2019-05-24 DIAGNOSIS — M79605 Pain in left leg: Secondary | ICD-10-CM

## 2019-05-24 DIAGNOSIS — M545 Low back pain: Secondary | ICD-10-CM

## 2019-05-24 DIAGNOSIS — E785 Hyperlipidemia, unspecified: Secondary | ICD-10-CM | POA: Diagnosis not present

## 2019-05-24 DIAGNOSIS — C61 Malignant neoplasm of prostate: Secondary | ICD-10-CM | POA: Diagnosis not present

## 2019-05-24 DIAGNOSIS — R7301 Impaired fasting glucose: Secondary | ICD-10-CM

## 2019-05-24 DIAGNOSIS — Z0000 Encounter for general adult medical examination without abnormal findings: Secondary | ICD-10-CM

## 2019-05-24 DIAGNOSIS — M79604 Pain in right leg: Secondary | ICD-10-CM

## 2019-05-24 MED ORDER — GABAPENTIN 800 MG PO TABS: 800.0000 mg | ORAL_TABLET | Freq: Every day | ORAL | 2 refills | Status: DC

## 2019-05-24 MED ORDER — PANTOPRAZOLE SODIUM 40 MG PO TBEC: DELAYED_RELEASE_TABLET | ORAL | 5 refills | Status: DC

## 2019-05-24 MED ORDER — GABAPENTIN 400 MG PO CAPS: ORAL_CAPSULE | ORAL | 5 refills | Status: DC

## 2019-05-24 MED ORDER — IBUPROFEN 600 MG PO TABS: ORAL_TABLET | ORAL | 5 refills | Status: DC

## 2019-05-24 MED FILL — IBUPROFEN 600 MG TABLET: 600 | 10 days supply | Qty: 30 | Fill #0

## 2019-05-24 MED FILL — GABAPENTIN 800 MG TABLET: 800 | 90 days supply | Qty: 90 | Fill #0

## 2019-05-24 MED FILL — PANTOPRAZOLE SOD DR 40 MG T: 40 | 60 days supply | Qty: 60 | Fill #0

## 2019-05-24 NOTE — Progress Notes (Signed)
Anthony Erickson     MRN: [id number]      DOB: 10-05-1961   HPI: Patient is in for annual physical exam. C/o uncontrolled low back pain radiating down both legs , especially after standing for a prolonged period. C/o increased personal and family stress following recent loss of his Father, does not currently feel the need for theray but is open to the idea Recent labs, are reviewed.and are excellent overall Immunization is reviewed , and  Is up to date   PE; Pleasant BP 114/82   Pulse 95   Temp (!) 97.3 F (36.3 C) (Temporal)   Resp 16   Ht 5\' 6"  (1.676 m)   Wt 235 lb (106.6 kg)   SpO2 98%   BMI 37.93 kg/m   male, alert and oriented x 3, in no cardio-pulmonary distress. Afebrile. HEENT No facial trauma or asymetry. Sinuses non tender. EOMI, pupils equally reactive to light. External ears normal, Oropharynx moist, no exudate. Neck: supple, no adenopathy,JVD or thyromegaly.No bruits.  Chest: Clear to ascultation bilaterally.No crackles or wheezes. Non tender to palpation  Breast: No asymetry,no masses. No nipple discharge or inversion. No axillary or supraclavicular adenopathy  Cardiovascular system; Heart sounds normal,  S1 and  S2 ,no S3.  No murmur, or thrill. Apical beat not displaced Peripheral pulses normal.  Abdomen: Soft, non tender, no organomegaly or masses. No bruits. Bowel sounds normal. No guarding, tenderness or rebound.      Musculoskeletal exam: Decreased  ROM of lumbar  spine, adequate in hips , shoulders and knees. No deformity ,swelling or crepitus noted. No muscle wasting or atrophy.   Neurologic: Cranial nerves 2 to 12 intact. Power, tone ,sensation and reflexes normal throughout. No disturbance in gait. No tremor.  Skin: Intact, no ulceration, erythema , scaling or rash noted. Pigmentation normal throughout  Psych; Mildly anxious  mood and affect. Judgement and concentration normal   Assessment & Plan:  Annual physical exam  Annual exam as documented. Counseling done  re healthy lifestyle involving commitment to 150 minutes exercise per week, heart healthy diet, and attaining healthy weight.The importance of adequate sleep also discussed. Regular seat belt use and home safety, is also discussed. Changes in health habits are decided on by the patient with goals and time frames  set for achieving them. Immunization and cancer screening needs are specifically addressed at this visit.   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   Lipid Panel  Lab Results  Component Value Date   CHOL 170 05/17/2019   HDL 37 (L) 05/17/2019   LDLCALC 101 (H) 05/17/2019   TRIG 205 (H) 05/17/2019   CHOLHDL 4.6 05/17/2019     Needs to lower fat intake and increase exercise commitment  Morbid obesity ([place]) Obesity linked with arthritis and cancer and dyslipidemia  Patient re-educated about  the importance of commitment to a  minimum of 150 minutes of exercise per week as able.  The importance of healthy food choices with portion control discussed, as well as eating regularly and within a 12 hour window most days. The need to choose "clean , green" food 50 to 75% of the time is discussed, as well as to make water the  primary drink and set a goal of 64 ounces water daily.    Weight /BMI 05/24/2019 03/21/2019 10/24/2018  WEIGHT 235 lb 215 lb 235 lb  HEIGHT 5\' 6"  5\' 6"  5\' 6"   BMI 37.93 kg/m2 34.7 kg/m2 37.93 kg/m2

## 2019-05-24 NOTE — Patient Instructions (Addendum)
Follow-up with MD in 6 months call if you need me sooner.  Please work on weight loss , change in eating habits and commitment to more regular exercise .  Gabapentin for back and lower extremity lower leg pain is changed to 800 mg 1 capsule at bedtime.  Please reach out for help with coping with stress through employee health the number to call will be provided   Reduce fried and fatty foods to improve your cholesterol levels and lower your risk of heart disease.  Please get fasting lipid panel and fasting  blood sugar  the last week in December thank you  Think about what you will eat, plan ahead. Choose " clean, green, fresh or frozen" over canned, processed or packaged foods which are more sugary, salty and fatty. 70 to 75% of food eaten should be vegetables and fruit. Three meals at set times with snacks allowed between meals, but they must be fruit or vegetables. Aim to eat over a 12 hour period , example 7 am to 7 pm, and STOP after  your last meal of the day. Drink water,generally about 64 ounces per day, no other drink is as healthy. Fruit juice is best enjoyed in a healthy way, by EATING the fruit. Thanks for choosing [facility], we consider it a privelige to serve you.

## 2019-05-26 ENCOUNTER — Other Ambulatory Visit: Payer: 59

## 2019-05-26 DIAGNOSIS — R6889 Other general symptoms and signs: Secondary | ICD-10-CM | POA: Diagnosis not present

## 2019-05-26 DIAGNOSIS — Z20822 Contact with and (suspected) exposure to covid-19: Secondary | ICD-10-CM

## 2019-05-27 ENCOUNTER — Encounter: Payer: Self-pay | Admitting: Family Medicine

## 2019-05-27 NOTE — Assessment & Plan Note (Signed)
Obesity linked with arthritis and cancer and dyslipidemia  Patient re-educated about  the importance of commitment to a  minimum of 150 minutes of exercise per week as able.  The importance of healthy food choices with portion control discussed, as well as eating regularly and within a 12 hour window most days. The need to choose "clean , green" food 50 to 75% of the time is discussed, as well as to make water the primary drink and set a goal of 64 ounces water daily.    Weight /BMI 05/24/2019 03/21/2019 10/24/2018  WEIGHT 235 lb 215 lb 235 lb  HEIGHT 5\' 6"  5\' 6"  5\' 6"   BMI 37.93 kg/m2 34.7 kg/m2 37.93 kg/m2

## 2019-05-27 NOTE — Assessment & Plan Note (Signed)
Increased and uncontrolled especially after standing for a long period of time, gabapentin dose increased to 800 mg at bedtime Weight loss encouraged , would also benefit from physical therapy

## 2019-05-27 NOTE — Assessment & Plan Note (Addendum)
  Followed by Urology, in remission

## 2019-05-27 NOTE — Assessment & Plan Note (Signed)

## 2019-05-27 NOTE — Assessment & Plan Note (Signed)
Hyperlipidemia:Low fat diet discussed and encouraged.   Lipid Panel  Lab Results  Component Value Date   CHOL 170 05/17/2019   HDL 37 (L) 05/17/2019   LDLCALC 101 (H) 05/17/2019   TRIG 205 (H) 05/17/2019   CHOLHDL 4.6 05/17/2019     Needs to lower fat intake and increase exercise commitment

## 2019-05-29 LAB — NOVEL CORONAVIRUS, NAA: SARS-CoV-2, NAA: NOT DETECTED

## 2019-06-14 DIAGNOSIS — Z8546 Personal history of malignant neoplasm of prostate: Secondary | ICD-10-CM | POA: Diagnosis not present

## 2019-06-16 ENCOUNTER — Ambulatory Visit (INDEPENDENT_AMBULATORY_CARE_PROVIDER_SITE_OTHER): Payer: 59 | Admitting: Urology

## 2019-06-16 ENCOUNTER — Other Ambulatory Visit: Payer: Self-pay

## 2019-06-16 ENCOUNTER — Other Ambulatory Visit (HOSPITAL_COMMUNITY)
Admission: RE | Admit: 2019-06-16 | Discharge: 2019-06-16 | Disposition: A | Discharge status: Home / Self Care | Payer: 59 | Source: Ambulatory Visit | Attending: Urology | Admitting: Urology

## 2019-06-16 DIAGNOSIS — R3121 Asymptomatic microscopic hematuria: Secondary | ICD-10-CM | POA: Diagnosis not present

## 2019-06-16 DIAGNOSIS — R9721 Rising PSA following treatment for malignant neoplasm of prostate: Secondary | ICD-10-CM

## 2019-06-16 DIAGNOSIS — Z8546 Personal history of malignant neoplasm of prostate: Secondary | ICD-10-CM

## 2019-06-16 LAB — URINALYSIS, COMPLETE (UACMP) WITH MICROSCOPIC
Bacteria, UA: NONE SEEN
Bilirubin Urine: NEGATIVE
Glucose, UA: NEGATIVE mg/dL
Ketones, ur: NEGATIVE mg/dL
Leukocytes,Ua: NEGATIVE
Nitrite: NEGATIVE
Protein, ur: 30 mg/dL — AB
Specific Gravity, Urine: 1.023 (ref 1.005–1.030)
pH: 5 (ref 5.0–8.0)

## 2019-07-15 ENCOUNTER — Other Ambulatory Visit: Payer: Self-pay

## 2019-07-15 ENCOUNTER — Emergency Department (HOSPITAL_COMMUNITY)
Admission: EM | Admit: 2019-07-15 | Discharge: 2019-07-15 | Disposition: A | Discharge status: Home / Self Care | Payer: 59 | Attending: Emergency Medicine | Admitting: Emergency Medicine

## 2019-07-15 ENCOUNTER — Encounter (HOSPITAL_COMMUNITY): Payer: Self-pay | Admitting: Emergency Medicine

## 2019-07-15 DIAGNOSIS — Y92000 Kitchen of unspecified non-institutional (private) residence as  the place of occurrence of the external cause: Secondary | ICD-10-CM | POA: Insufficient documentation

## 2019-07-15 DIAGNOSIS — T25221A Burn of second degree of right foot, initial encounter: Secondary | ICD-10-CM | POA: Diagnosis not present

## 2019-07-15 DIAGNOSIS — Y999 Unspecified external cause status: Secondary | ICD-10-CM | POA: Diagnosis not present

## 2019-07-15 DIAGNOSIS — X12XXXA Contact with other hot fluids, initial encounter: Secondary | ICD-10-CM | POA: Insufficient documentation

## 2019-07-15 DIAGNOSIS — T310 Burns involving less than 10% of body surface: Secondary | ICD-10-CM | POA: Diagnosis not present

## 2019-07-15 DIAGNOSIS — T25321A Burn of third degree of right foot, initial encounter: Secondary | ICD-10-CM | POA: Insufficient documentation

## 2019-07-15 DIAGNOSIS — Y93G9 Activity, other involving cooking and grilling: Secondary | ICD-10-CM | POA: Insufficient documentation

## 2019-07-15 DIAGNOSIS — Z8546 Personal history of malignant neoplasm of prostate: Secondary | ICD-10-CM | POA: Insufficient documentation

## 2019-07-15 DIAGNOSIS — Z23 Encounter for immunization: Secondary | ICD-10-CM | POA: Insufficient documentation

## 2019-07-15 MED ORDER — SILVER SULFADIAZINE 1 % EX CREA
TOPICAL_CREAM | Freq: Once | CUTANEOUS | Status: AC
  Administered 2019-07-15: 1 via TOPICAL
  Filled 2019-07-15: qty 50

## 2019-07-15 MED ORDER — HYDROCODONE-ACETAMINOPHEN 5-325 MG PO TABS: 1.0000 | ORAL_TABLET | Freq: Four times a day (QID) | ORAL | 0 refills | Status: AC | PRN

## 2019-07-15 MED ORDER — SILVER SULFADIAZINE 1 % EX CREA: 1.0000 "application " | TOPICAL_CREAM | Freq: Every day | CUTANEOUS | 0 refills | Status: DC

## 2019-07-15 MED ORDER — HYDROCODONE-ACETAMINOPHEN 5-325 MG PO TABS
1.0000 | ORAL_TABLET | Freq: Once | ORAL | Status: AC
  Administered 2019-07-15: 1 via ORAL
  Filled 2019-07-15: qty 1

## 2019-07-15 MED ORDER — TETANUS-DIPHTH-ACELL PERTUSSIS 5-2.5-18.5 LF-MCG/0.5 IM SUSP
0.5000 mL | Freq: Once | INTRAMUSCULAR | Status: AC
  Administered 2019-07-15: 0.5 mL via INTRAMUSCULAR
  Filled 2019-07-15: qty 0.5
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
## 2019-07-15 NOTE — Discharge Instructions (Addendum)
Continue daily wound changes and applying Silvadene cream.  Follow-up with your primary care doctor so that they may continue to follow your wound healing.  Elevate your foot when you are not using it to help prevent swelling. ` If you have any new or worsening symptoms please return to the emergency department.  Please do your that this medication can make you sleepy or drowsy.  Do not take while driving or operating heavy machinery.  This medication can also be addictive and if not taken as prescribed that may cause deadly overdose.
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
## 2019-07-17 ENCOUNTER — Other Ambulatory Visit: Payer: Self-pay

## 2019-07-17 ENCOUNTER — Encounter: Payer: Self-pay | Admitting: Family Medicine

## 2019-07-17 ENCOUNTER — Ambulatory Visit (INDEPENDENT_AMBULATORY_CARE_PROVIDER_SITE_OTHER): Payer: 59 | Admitting: Family Medicine

## 2019-07-17 VITALS — BP 108/64 | HR 94 | Temp 97.1°F | Resp 15 | Ht 66.0 in | Wt 232.0 lb

## 2019-07-17 DIAGNOSIS — T25321A Burn of third degree of right foot, initial encounter: Secondary | ICD-10-CM

## 2019-07-17 DIAGNOSIS — K219 Gastro-esophageal reflux disease without esophagitis: Secondary | ICD-10-CM

## 2019-07-17 DIAGNOSIS — Z23 Encounter for immunization: Secondary | ICD-10-CM | POA: Diagnosis not present

## 2019-07-17 DIAGNOSIS — T25321D Burn of third degree of right foot, subsequent encounter: Secondary | ICD-10-CM | POA: Diagnosis not present

## 2019-07-17 HISTORY — DX: Burn of third degree of right foot, initial encounter: T25.321A

## 2019-07-17 MED ORDER — DOXYCYCLINE HYCLATE 100 MG PO TABS: 100.0000 mg | ORAL_TABLET | Freq: Two times a day (BID) | ORAL | 0 refills | Status: DC

## 2019-07-17 MED FILL — HYDROCODON-APAP 5-325: 5-325 | 2 days supply | Qty: 8 | Fill #0

## 2019-07-17 MED FILL — DOXYCYCLINE HYCLATE 100 MG: 100 | 10 days supply | Qty: 20 | Fill #0

## 2019-07-17 MED FILL — SSD 1% CREAM: 1 | 30 days supply | Qty: 50 | Fill #0

## 2019-07-17 NOTE — Assessment & Plan Note (Signed)
3 day history, painful with weight bearing, and some drainage noted. Cleaned with saline and dressed in office  doxycycline x 1 week and refer to wound clinic Work excuse form 09/14/to 07/20/2019

## 2019-07-17 NOTE — Assessment & Plan Note (Signed)
After obtaining informed consent, the vaccine is  administered , with no adverse effect noted at the time of administration.  

## 2019-07-17 NOTE — Assessment & Plan Note (Signed)
  Patient re-educated about  the importance of commitment to a  minimum of 150 minutes of exercise per week as able.  The importance of healthy food choices with portion control discussed, as well as eating regularly and within a 12 hour window most days. The need to choose "clean , green" food 50 to 75% of the time is discussed, as well as to make water the primary drink and set a goal of 64 ounces water daily.    Weight /BMI 07/17/2019 05/24/2019 03/21/2019  WEIGHT 232 lb 235 lb 215 lb  HEIGHT 5\' 6"  5\' 6"  5\' 6"   BMI 37.45 kg/m2 37.93 kg/m2 34.7 kg/m2
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
## 2019-07-17 NOTE — Assessment & Plan Note (Signed)
Controlled, no change in medication  

## 2019-07-17 NOTE — Patient Instructions (Addendum)
F/U as before , call if you need me sooner  10 day antibiotic course is prescribed  Flu vaccine today  You are referred urgently;y to the wound center , we will call with appt info  Work excuse for 09/14, 15 1nd 16/2020

## 2019-07-18 ENCOUNTER — Telehealth: Payer: Self-pay | Admitting: Family Medicine

## 2019-07-18 NOTE — Telephone Encounter (Signed)
FMLA Sleeved Noted  Copied

## 2019-07-24 ENCOUNTER — Telehealth: Payer: Self-pay | Admitting: *Deleted

## 2019-07-24 NOTE — Telephone Encounter (Signed)
Please advise 

## 2019-07-24 NOTE — Telephone Encounter (Signed)
Pt called said he would need an extended note for work. He goes to the wound center on Thursday and he would need to be out the rest of this week. He can be reached at [phone]

## 2019-07-24 NOTE — Telephone Encounter (Signed)
Pt reports changing  Bandage twice daily, getting less, wound not closing, still painful sporadically, and unable to weight bear, no fever or chills Has appt 9/24 , was 23 rd, at wound center, will extend work excuse to return 09/25 with clear understanding that wound center assumes care fully and further work extension

## 2019-07-25 NOTE — Telephone Encounter (Signed)
Patient aware work note ready

## 2019-07-25 NOTE — Telephone Encounter (Signed)
Please write new work excuse for patient to return 07/28/2019, same start date , have him collect, thanks

## 2019-07-26 ENCOUNTER — Encounter (HOSPITAL_BASED_OUTPATIENT_CLINIC_OR_DEPARTMENT_OTHER): Payer: 59

## 2019-07-26 DIAGNOSIS — K219 Gastro-esophageal reflux disease without esophagitis: Secondary | ICD-10-CM

## 2019-07-27 ENCOUNTER — Encounter (HOSPITAL_BASED_OUTPATIENT_CLINIC_OR_DEPARTMENT_OTHER): Payer: 59 | Attending: Internal Medicine

## 2019-07-27 ENCOUNTER — Other Ambulatory Visit: Payer: Self-pay

## 2019-07-27 DIAGNOSIS — X12XXXA Contact with other hot fluids, initial encounter: Secondary | ICD-10-CM | POA: Insufficient documentation

## 2019-07-27 DIAGNOSIS — Z6836 Body mass index (BMI) 36.0-36.9, adult: Secondary | ICD-10-CM | POA: Insufficient documentation

## 2019-07-27 DIAGNOSIS — Z8546 Personal history of malignant neoplasm of prostate: Secondary | ICD-10-CM | POA: Diagnosis not present

## 2019-07-27 DIAGNOSIS — T25221A Burn of second degree of right foot, initial encounter: Secondary | ICD-10-CM | POA: Diagnosis not present

## 2019-07-27 DIAGNOSIS — Z923 Personal history of irradiation: Secondary | ICD-10-CM | POA: Diagnosis not present

## 2019-07-28 DIAGNOSIS — T25221D Burn of second degree of right foot, subsequent encounter: Secondary | ICD-10-CM | POA: Diagnosis not present

## 2019-07-31 ENCOUNTER — Telehealth: Payer: Self-pay | Admitting: *Deleted

## 2019-07-31 NOTE — Telephone Encounter (Signed)
Disability forms received via fax Copied noted and sleeved

## 2019-08-03 ENCOUNTER — Other Ambulatory Visit: Payer: Self-pay

## 2019-08-03 ENCOUNTER — Encounter (HOSPITAL_BASED_OUTPATIENT_CLINIC_OR_DEPARTMENT_OTHER): Payer: 59 | Attending: Internal Medicine | Admitting: Internal Medicine

## 2019-08-03 DIAGNOSIS — Z09 Encounter for follow-up examination after completed treatment for conditions other than malignant neoplasm: Secondary | ICD-10-CM | POA: Insufficient documentation

## 2019-08-03 DIAGNOSIS — Z872 Personal history of diseases of the skin and subcutaneous tissue: Secondary | ICD-10-CM | POA: Insufficient documentation

## 2019-08-03 DIAGNOSIS — Z923 Personal history of irradiation: Secondary | ICD-10-CM | POA: Diagnosis not present
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
## 2019-11-06 ENCOUNTER — Encounter: Payer: Self-pay | Admitting: Family Medicine

## 2019-11-06 ENCOUNTER — Ambulatory Visit (INDEPENDENT_AMBULATORY_CARE_PROVIDER_SITE_OTHER): Payer: 59 | Admitting: Family Medicine

## 2019-11-06 ENCOUNTER — Other Ambulatory Visit: Payer: Self-pay

## 2019-11-06 VITALS — BP 108/64 | Ht 66.0 in | Wt 232.0 lb

## 2019-11-06 DIAGNOSIS — M25562 Pain in left knee: Secondary | ICD-10-CM

## 2019-11-06 DIAGNOSIS — E559 Vitamin D deficiency, unspecified: Secondary | ICD-10-CM

## 2019-11-06 DIAGNOSIS — G4709 Other insomnia: Secondary | ICD-10-CM | POA: Diagnosis not present

## 2019-11-06 DIAGNOSIS — C61 Malignant neoplasm of prostate: Secondary | ICD-10-CM

## 2019-11-06 DIAGNOSIS — E785 Hyperlipidemia, unspecified: Secondary | ICD-10-CM

## 2019-11-06 DIAGNOSIS — K219 Gastro-esophageal reflux disease without esophagitis: Secondary | ICD-10-CM | POA: Diagnosis not present

## 2019-11-06 MED ORDER — DICLOFENAC SODIUM 1 % EX GEL: CUTANEOUS | 1 refills | Status: DC

## 2019-11-06 MED ORDER — GABAPENTIN 800 MG PO TABS: 800.0000 mg | ORAL_TABLET | Freq: Every day | ORAL | 2 refills | Status: DC

## 2019-11-06 MED ORDER — ERGOCALCIFEROL 1.25 MG (50000 UT) PO CAPS: 50000.0000 [IU] | ORAL_CAPSULE | ORAL | 1 refills | Status: DC

## 2019-11-06 MED ORDER — TEMAZEPAM 15 MG PO CAPS: 15.0000 mg | ORAL_CAPSULE | Freq: Every evening | ORAL | 1 refills | Status: DC | PRN

## 2019-11-06 MED FILL — DICLOFENAC SODIUM 1 % GEL: 1 | 75 days supply | Qty: 300 | Fill #0

## 2019-11-06 MED FILL — GABAPENTIN 800 MG TABLET: 800 | 90 days supply | Qty: 90 | Fill #0

## 2019-11-06 MED FILL — VIT D2 1.25 MG (50,000 UNIT: 1.25 MG | 84 days supply | Qty: 12 | Fill #0

## 2019-11-06 MED FILL — TEMAZEPAM 15 MG CAPSULE: 15 | 30 days supply | Qty: 30 | Fill #0

## 2019-11-06 NOTE — Patient Instructions (Addendum)
Annual physical exam in office with mD in 6 months, call if you need me sooner  CBC, fasting lipid, cmp and EGFR, tSH and vit D 1 week before next visit New medications are , once weekly vitamin d, voltaren gel , and restoril  Please continue to work on weight loss, to protect your knee  Think about what you will eat, plan ahead. Choose " clean, green, fresh or frozen" over canned, processed or packaged foods which are more sugary, salty and fatty. 70 to 75% of food eaten should be vegetables and fruit. Three meals at set times with snacks allowed between meals, but they must be fruit or vegetables. Aim to eat over a 12 hour period , example 7 am to 7 pm, and STOP after  your last meal of the day. Drink water,generally about 64 ounces per day, no other drink is as healthy. Fruit juice is best enjoyed in a healthy way, by EATING the fruit.

## 2019-11-06 NOTE — Progress Notes (Signed)
Virtual Visit via Telephone Note  I connected with Anthony Erickson on 11/06/19 at  3:40 PM EST by telephone and verified that I am speaking with the correct person using two identifiers.  Location: Patient: home Provider: office   I discussed the limitations, risks, security and privacy concerns of performing an evaluation and management service by telephone and the availability of in person appointments. I also discussed with the patient that there may be a patient responsible charge related to this service. The patient expressed understanding and agreed to proceed.   History of Present Illness: Denies recent fever or chills. Denies sinus pressure, nasal congestion, ear pain or sore throat. Denies chest congestion, productive cough or wheezing. Denies chest pains, palpitations and leg swelling Denies abdominal pain, nausea, vomiting,diarrhea or constipation.   Denies dysuria, frequency, hesitancy or incontinence. Chronic knee and back pain, no int erst in Ortho eval at this time Denies headaches, seizures, numbness, or tingling. Denies depression, anxiety c/o  Insomnia on average 5 days/ week Denies skin break down or rash.       Observations/Objective: BP 108/64   Ht 5\' 6"  ([id number] m)   Wt 232 lb (105.2 kg)   BMI 37.45 kg/m  Good communication with no confusion and intact memory. Alert and oriented x 3 No signs of respiratory distress during speech    Assessment and Plan: Insomnia disorder Sleep hygiene reviewed and written information offered also. Prescription sent for  medication needed.   Morbid obesity ([place])  Patient re-educated about  the importance of commitment to a  minimum of 150 minutes of exercise per week as able.  The importance of healthy food choices with portion control discussed, as well as eating regularly and within a 12 hour window most days. The need to choose "clean , green" food 50 to 75% of the time is discussed, as well as to make water the  primary drink and set a goal of 64 ounces water daily.    Weight /BMI 11/06/2019 07/17/2019 05/24/2019  WEIGHT 232 lb 232 lb 235 lb  HEIGHT 5\' 6"  5\' 6"  5\' 6"   BMI 37.45 kg/m2 37.45 kg/m2 37.93 kg/m2      Left anterior knee pain Increasing pain and reduced mobility, holding off on Ortho eval Weight loss encouraged  Adenocarcinoma of prostate Followed by Urology  GERD Controlled, no change in medication     Follow Up Instructions:    I discussed the assessment and treatment plan with the patient. The patient was provided an opportunity to ask questions and all were answered. The patient agreed with the plan and demonstrated an understanding of the instructions.   The patient was advised to call back or seek an in-person evaluation if the symptoms worsen or if the condition fails to improve as anticipated.  I provided 15 minutes of non-face-to-face time during this encounter.   Tula Nakayama, MD

## 2019-11-10 ENCOUNTER — Encounter: Payer: Self-pay | Admitting: Family Medicine

## 2019-11-10 NOTE — Assessment & Plan Note (Signed)
Followed by Urology 

## 2019-11-10 NOTE — Assessment & Plan Note (Addendum)
Obesity linked with arthritis and insomnia Patient re-educated about  the importance of commitment to a  minimum of 150 minutes of exercise per week as able.  The importance of healthy food choices with portion control discussed, as well as eating regularly and within a 12 hour window most days. The need to choose "clean , green" food 50 to 75% of the time is discussed, as well as to make water the primary drink and set a goal of 64 ounces water daily.    Weight /BMI 11/06/2019 07/17/2019 05/24/2019  WEIGHT 232 lb 232 lb 235 lb  HEIGHT 5\' 6"  5\' 6"  5\' 6"   BMI 37.45 kg/m2 37.45 kg/m2 37.93 kg/m2

## 2019-11-10 NOTE — Assessment & Plan Note (Signed)
Increasing pain and reduced mobility, holding off on Ortho eval Weight loss encouraged

## 2019-11-10 NOTE — Assessment & Plan Note (Signed)
Controlled, no change in medication  

## 2019-11-10 NOTE — Assessment & Plan Note (Signed)
Sleep hygiene reviewed and written information offered also. Prescription sent for  medication needed.  

## 2020-01-16 ENCOUNTER — Telehealth: Payer: Self-pay

## 2020-01-16 DIAGNOSIS — M25562 Pain in left knee: Secondary | ICD-10-CM

## 2020-01-16 NOTE — Telephone Encounter (Signed)
Pt is calling to ask if we can refer him to Dr Aline Brochure, Knee has been hurting and now he cant stand on it

## 2020-01-17 NOTE — Telephone Encounter (Signed)
Referral entered for left knee pain and swelling
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
## 2020-02-12 ENCOUNTER — Encounter: Payer: Self-pay | Admitting: Orthopedic Surgery

## 2020-02-12 ENCOUNTER — Other Ambulatory Visit: Payer: Self-pay

## 2020-02-12 ENCOUNTER — Ambulatory Visit (INDEPENDENT_AMBULATORY_CARE_PROVIDER_SITE_OTHER): Payer: 59 | Admitting: Orthopedic Surgery

## 2020-02-12 VITALS — Temp 97.2°F | Ht 66.0 in | Wt 237.4 lb

## 2020-02-12 DIAGNOSIS — M1712 Unilateral primary osteoarthritis, left knee: Secondary | ICD-10-CM | POA: Diagnosis not present

## 2020-02-12 DIAGNOSIS — M2242 Chondromalacia patellae, left knee: Secondary | ICD-10-CM | POA: Diagnosis not present

## 2020-02-12 DIAGNOSIS — M1710 Unilateral primary osteoarthritis, unspecified knee: Secondary | ICD-10-CM

## 2020-02-12 DIAGNOSIS — G8929 Other chronic pain: Secondary | ICD-10-CM | POA: Diagnosis not present

## 2020-02-12 DIAGNOSIS — M23322 Other meniscus derangements, posterior horn of medial meniscus, left knee: Secondary | ICD-10-CM | POA: Diagnosis not present

## 2020-02-12 NOTE — Patient Instructions (Addendum)
You have been scheduled for an MRI scan//  OPEN UNIT   We will call your insurance company to do a precertification to get the MRI covered  You will receive a phone call regarding the date of the scan  Dr Aline Brochure will call you with the results  MRI LEFT KNEE   (no metal mild claustrophobia)

## 2020-03-08 ENCOUNTER — Ambulatory Visit
Admission: RE | Admit: 2020-03-08 | Discharge: 2020-03-08 | Disposition: A | Discharge status: Home / Self Care | Payer: 59 | Source: Ambulatory Visit | Attending: Orthopedic Surgery | Admitting: Orthopedic Surgery

## 2020-03-08 ENCOUNTER — Other Ambulatory Visit: Payer: Self-pay

## 2020-03-08 DIAGNOSIS — M25562 Pain in left knee: Secondary | ICD-10-CM

## 2020-03-08 DIAGNOSIS — M23322 Other meniscus derangements, posterior horn of medial meniscus, left knee: Secondary | ICD-10-CM

## 2020-03-08 DIAGNOSIS — R600 Localized edema: Secondary | ICD-10-CM | POA: Diagnosis not present

## 2020-03-08 DIAGNOSIS — G8929 Other chronic pain: Secondary | ICD-10-CM

## 2020-03-13 ENCOUNTER — Telehealth: Payer: Self-pay | Admitting: Orthopedic Surgery

## 2020-03-13 NOTE — Telephone Encounter (Signed)
Patient had left a voice message later this afternoon asking for his MRI results - visit notes indicate he will receive by phone call from Dr Aline Brochure.

## 2020-03-14 ENCOUNTER — Telehealth: Payer: Self-pay | Admitting: Orthopedic Surgery

## 2020-03-14 NOTE — Telephone Encounter (Signed)
Patient's MRI reviewed and findings delivered to the patient.  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  He says his knee is really hurting him and based on those findings and his symptoms I recommended he have an arthroscopic synovectomy.  He would like to do that on the 27th.  He understands will be on a protocol to rest 1 week and then home exercises for 3 weeks

## 2020-03-15 ENCOUNTER — Other Ambulatory Visit: Payer: Self-pay | Admitting: Orthopedic Surgery

## 2020-03-18 ENCOUNTER — Encounter: Payer: Self-pay | Admitting: Orthopedic Surgery

## 2020-03-22 NOTE — Patient Instructions (Signed)
Anthony Erickson  03/22/2020     @PREFPERIOPPHARMACY @   Your procedure is scheduled on  03/28/2020 .  Report to [facility] at  [facility]  Call this number if you have problems the morning of surgery:  [phone]   Remember:  Do not eat or drink after midnight.                      Take these medicines the morning of surgery with A SIP OF WATER None    Do not wear jewelry, make-up or nail polish.  Do not wear lotions, powders, or perfumes. Please wear deodorant and brush your teeth.  Do not shave 48 hours prior to surgery.  Men may shave face and neck.  Do not bring valuables to the hospital.  [facility] is not responsible for any belongings or valuables.  Contacts, dentures or bridgework may not be worn into surgery.  Leave your suitcase in the car.  After surgery it may be brought to your room.  For patients admitted to the hospital, discharge time will be determined by your treatment team.  Patients discharged the day of surgery will not be allowed to drive home.   Name and phone number of your driver:   family Special instructions:  DO NOT smoke the morning of your procedure.  Please read over the following fact sheets that you were given. Anesthesia Post-op Instructions and Care and Recovery After Surgery       Arthroscopic Knee Ligament Repair, Care After This sheet gives you information about how to care for yourself after your procedure. Your health care provider may also give you more specific instructions. If you have problems or questions, contact your health care provider. What can I expect after the procedure? After the procedure, it is common to have:  Pain in your knee.  Bruising and swelling on your knee, calf, and ankle for 3-4 days.  Fatigue. Follow these instructions at home: If you have a brace or immobilizer:  Wear the brace or immobilizer as told by your health care provider. Remove it only as told by your health care  provider.  Loosen the splint or immobilizer if your toes tingle, become numb, or turn cold and blue.  Keep the brace or immobilizer clean. Bathing  Do not take baths, swim, or use a hot tub until your health care provider approves. Ask your health care provider if you can take showers.  Keep your bandage (dressing) dry until your health care provider says that it can be removed. Cover it and your brace or immobilizer with a watertight covering when you take a shower. Incision care   Follow instructions from your health care provider about how to take care of your incision. Make sure you: ? Wash your hands with soap and water before you change your bandage (dressing). If soap and water are not available, use hand sanitizer. ? Change your dressing as told by your health care provider. ? Leave stitches (sutures), skin glue, or adhesive strips in place. These skin closures may need to stay in place for 2 weeks or longer. If adhesive strip edges start to loosen and curl up, you may trim the loose edges. Do not remove adhesive strips completely unless your health care provider tells you to do that.  Check your incision area every day for signs of infection. Check for: ? More redness, swelling, or pain. ?  More fluid or blood. ? Warmth. ? Pus or a bad smell. Managing pain, stiffness, and swelling   If directed, put ice on the affected area. ? If you have a removable brace or immobilizer, remove it as told by your health care provider. ? Put ice in a plastic bag. ? Place a towel between your skin and the bag or between your brace or immobilizer and the bag. ? Leave the ice on for 20 minutes, 2-3 times a day.  Move your toes often to avoid stiffness and to lessen swelling.  Raise (elevate) the injured area above the level of your heart while you are sitting or lying down. Driving  Do not drive until your health care provider approves. If you have a brace or immobilizer on your leg, ask  your health care provider when it is safe for you to drive.  Do not drive or use heavy machinery while taking prescription pain medicine. Activity  Rest as directed. Ask your health care provider what activities are safe for you.  Do physical therapy exercises as told by your health care provider. Physical therapy will help you regain strength and motion in your knee.  Follow instructions from your health care provider about: ? When you may start motion exercises. ? When you may start riding a stationary bike and doing other low-impact activities. ? When you may start to jog and do other high-impact activities. Safety  Do not use the injured limb to support your body weight until your health care provider says that you can. Use crutches as told by your health care provider. General instructions  Do not use any products that contain nicotine or tobacco, such as cigarettes and e-cigarettes. These can delay bone healing. If you need help quitting, ask your health care provider.  To prevent or treat constipation while you are taking prescription pain medicine, your health care provider may recommend that you: ? Drink enough fluid to keep your urine clear or pale yellow. ? Take over-the-counter or prescription medicines. ? Eat foods that are high in fiber, such as fresh fruits and vegetables, whole grains, and beans. ? Limit foods that are high in fat and processed sugars, such as fried and sweet foods.  Take over-the-counter and prescription medicines only as told by your health care provider.  Keep all follow-up visits as told by your health care provider. This is important. Contact a health care provider if:  You have more redness, swelling, or pain around an incision.  You have more fluid or blood coming from an incision.  Your incision feels warm to the touch.  You have a fever.  You have pain or swelling in your knee, and it gets worse.  You have pain that does not get  better with medicine. Get help right away if:  You have trouble breathing.  You have pus or a bad smell coming from an incision.  You have numbness and tingling near the knee joint. Summary  After the procedure, it is common to have knee pain with bruising and swelling on your knee, calf, and ankle.  Icing your knee and raising your leg above the level of your heart will help control the pain and the swelling.  Do physical therapy exercises as told by your health care provider. Physical therapy will help you regain strength and motion in your knee. This information is not intended to replace advice given to you by your health care provider. Make sure you discuss any questions you  have with your health care provider. Document Revised: 10/01/2017 Document Reviewed: 10/13/2016 Elsevier Patient Education  [place].  Spinal Anesthesia and Epidural Anesthesia, Care After This sheet gives you information about how to care for yourself after your procedure. Your doctor may also give you more specific instructions. If you have problems or questions, call your doctor. Follow these instructions at home: For at least 24 hours after the procedure:   Have a responsible adult stay with you. It is important to have someone help care for you until you are awake and alert.  Rest as needed.  Do not do activities where you could fall or get hurt (injured).  Do not drive.  Do not use heavy machinery.  Do not drink alcohol.  Do not take sleeping pills or medicines that make you sleepy (drowsy).  Do not make important decisions.  Do not sign legal documents.  Do not take care of children on your own. Eating and drinking  If you throw up (vomit), drink water, juice, or soup when nausea and vomiting stop.  Drink enough fluid to keep your pee (urine) pale yellow.  Make sure you do not feel like throwing up (nauseous) before you eat solid foods.  Follow the diet that your doctor  recommends. General instructions  Return to your normal activities as told by your doctor. Ask your doctor what activities are safe for you.  Take over-the-counter and prescription medicines only as told by your doctor.  If you have sleep apnea, surgery and certain medicines can raise your risk for breathing problems. Follow instructions from your doctor about when to wear your sleep device. Your doctor may tell you to wear your sleep device: ? Anytime you are sleeping, including during daytime naps. ? While taking prescription pain medicines, sleeping pills, or medicines that make you sleepy.  Do not use any products that contain nicotine or tobacco. This includes cigarettes and e-cigarettes. ? If you need help quitting, ask your doctor. ? If you smoke, do not smoke by yourself. Make sure someone is nearby in case you need help.  Keep all follow-up visits as told by your doctor. This is important. Contact a doctor if:  It has been more than one day since your procedure and you feel like throwing up.  It has been more than one day since your procedure and you throw up.  You have a rash. Get help right away if:  You have a fever.  You have a headache that lasts a long time.  You have a very bad headache.  Your vision is blurry.  You see two of a single object (double vision).  You are dizzy or light-headed.  You faint.  Your arms or legs tingle, feel weak, or get numb.  You have trouble breathing.  You cannot pee (urinate). Summary  After the procedure, have a responsible adult stay with you at home until you are fully awake and alert.  Do not do activities that might get you injured. Do not drive, use heavy machinery, drink alcohol, or make important decisions for 24 hours after the procedure.  Take medicines as told by your doctor. Do not use products that contain nicotine or tobacco.  Get help right away if you have a fever, blurry vision, difficulty breathing or  passing urine, or weakness or numbness in arms or legs. This information is not intended to replace advice given to you by your health care provider. Make sure you discuss any questions you  have with your health care provider. Document Revised: 10/01/2017 Document Reviewed: 02/10/2016 Elsevier Patient Education  2020 [place] Anesthesia, Adult, Care After This sheet gives you information about how to care for yourself after your procedure. Your health care provider may also give you more specific instructions. If you have problems or questions, contact your health care provider. What can I expect after the procedure? After the procedure, the following side effects are common:  Pain or discomfort at the IV site.  Nausea.  Vomiting.  Sore throat.  Trouble concentrating.  Feeling cold or chills.  Weak or tired.  Sleepiness and fatigue.  Soreness and body aches. These side effects can affect parts of the body that were not involved in surgery. Follow these instructions at home:  For at least 24 hours after the procedure:  Have a responsible adult stay with you. It is important to have someone help care for you until you are awake and alert.  Rest as needed.  Do not: ? Participate in activities in which you could fall or become injured. ? Drive. ? Use heavy machinery. ? Drink alcohol. ? Take sleeping pills or medicines that cause drowsiness. ? Make important decisions or sign legal documents. ? Take care of children on your own. Eating and drinking  Follow any instructions from your health care provider about eating or drinking restrictions.  When you feel hungry, start by eating small amounts of foods that are soft and easy to digest (bland), such as toast. Gradually return to your regular diet.  Drink enough fluid to keep your urine pale yellow.  If you vomit, rehydrate by drinking water, juice, or clear broth. General instructions  If you have sleep  apnea, surgery and certain medicines can increase your risk for breathing problems. Follow instructions from your health care provider about wearing your sleep device: ? Anytime you are sleeping, including during daytime naps. ? While taking prescription pain medicines, sleeping medicines, or medicines that make you drowsy.  Return to your normal activities as told by your health care provider. Ask your health care provider what activities are safe for you.  Take over-the-counter and prescription medicines only as told by your health care provider.  If you smoke, do not smoke without supervision.  Keep all follow-up visits as told by your health care provider. This is important. Contact a health care provider if:  You have nausea or vomiting that does not get better with medicine.  You cannot eat or drink without vomiting.  You have pain that does not get better with medicine.  You are unable to pass urine.  You develop a skin rash.  You have a fever.  You have redness around your IV site that gets worse. Get help right away if:  You have difficulty breathing.  You have chest pain.  You have blood in your urine or stool, or you vomit blood. Summary  After the procedure, it is common to have a sore throat or nausea. It is also common to feel tired.  Have a responsible adult stay with you for the first 24 hours after general anesthesia. It is important to have someone help care for you until you are awake and alert.  When you feel hungry, start by eating small amounts of foods that are soft and easy to digest (bland), such as toast. Gradually return to your regular diet.  Drink enough fluid to keep your urine pale yellow.  Return to your normal activities as told  by your health care provider. Ask your health care provider what activities are safe for you. This information is not intended to replace advice given to you by your health care provider. Make sure you discuss any  questions you have with your health care provider. Document Revised: 10/22/2017 Document Reviewed: 06/04/2017 Elsevier Patient Education  [place]. How to Use Chlorhexidine for Bathing Chlorhexidine gluconate (CHG) is a germ-killing (antiseptic) solution that is used to clean the skin. It can get rid of the bacteria that normally live on the skin and can keep them away for about 24 hours. To clean your skin with CHG, you may be given:  A CHG solution to use in the shower or as part of a sponge bath.  A prepackaged cloth that contains CHG. Cleaning your skin with CHG may help lower the risk for infection:  While you are staying in the intensive care unit of the hospital.  If you have a vascular access, such as a central line, to provide short-term or long-term access to your veins.  If you have a catheter to drain urine from your bladder.  If you are on a ventilator. A ventilator is a machine that helps you breathe by moving air in and out of your lungs.  After surgery. What are the risks? Risks of using CHG include:  A skin reaction.  Hearing loss, if CHG gets in your ears.  Eye injury, if CHG gets in your eyes and is not rinsed out.  The CHG product catching fire. Make sure that you avoid smoking and flames after applying CHG to your skin. Do not use CHG:  If you have a chlorhexidine allergy or have previously reacted to chlorhexidine.  On babies younger than 10 months of age. How to use CHG solution  Use CHG only as told by your health care provider, and follow the instructions on the label.  Use the full amount of CHG as directed. Usually, this is one bottle. During a shower Follow these steps when using CHG solution during a shower (unless your health care provider gives you different instructions): 1. Start the shower. 2. Use your normal soap and shampoo to wash your face and hair. 3. Turn off the shower or move out of the shower stream. 4. Pour the CHG  onto a clean washcloth. Do not use any type of brush or rough-edged sponge. 5. Starting at your neck, lather your body down to your toes. Make sure you follow these instructions: ? If you will be having surgery, pay special attention to the part of your body where you will be having surgery. Scrub this area for at least 1 minute. ? Do not use CHG on your head or face. If the solution gets into your ears or eyes, rinse them well with water. ? Avoid your genital area. ? Avoid any areas of skin that have broken skin, cuts, or scrapes. ? Scrub your back and under your arms. Make sure to wash skin folds. 6. Let the lather sit on your skin for 1-2 minutes or as long as told by your health care provider. 7. Thoroughly rinse your entire body in the shower. Make sure that all body creases and crevices are rinsed well. 8. Dry off with a clean towel. Do not put any substances on your body afterward--such as powder, lotion, or perfume--unless you are told to do so by your health care provider. Only use lotions that are recommended by the manufacturer. 9. Put on clean  clothes or pajamas. 10. If it is the night before your surgery, sleep in clean sheets.  During a sponge bath Follow these steps when using CHG solution during a sponge bath (unless your health care provider gives you different instructions): 1. Use your normal soap and shampoo to wash your face and hair. 2. Pour the CHG onto a clean washcloth. 3. Starting at your neck, lather your body down to your toes. Make sure you follow these instructions: ? If you will be having surgery, pay special attention to the part of your body where you will be having surgery. Scrub this area for at least 1 minute. ? Do not use CHG on your head or face. If the solution gets into your ears or eyes, rinse them well with water. ? Avoid your genital area. ? Avoid any areas of skin that have broken skin, cuts, or scrapes. ? Scrub your back and under your arms. Make  sure to wash skin folds. 4. Let the lather sit on your skin for 1-2 minutes or as long as told by your health care provider. 5. Using a different clean, wet washcloth, thoroughly rinse your entire body. Make sure that all body creases and crevices are rinsed well. 6. Dry off with a clean towel. Do not put any substances on your body afterward--such as powder, lotion, or perfume--unless you are told to do so by your health care provider. Only use lotions that are recommended by the manufacturer. 7. Put on clean clothes or pajamas. 8. If it is the night before your surgery, sleep in clean sheets. How to use CHG prepackaged cloths  Only use CHG cloths as told by your health care provider, and follow the instructions on the label.  Use the CHG cloth on clean, dry skin.  Do not use the CHG cloth on your head or face unless your health care provider tells you to.  When washing with the CHG cloth: ? Avoid your genital area. ? Avoid any areas of skin that have broken skin, cuts, or scrapes. Before surgery Follow these steps when using a CHG cloth to clean before surgery (unless your health care provider gives you different instructions): 1. Using the CHG cloth, vigorously scrub the part of your body where you will be having surgery. Scrub using a back-and-forth motion for 3 minutes. The area on your body should be completely wet with CHG when you are done scrubbing. 2. Do not rinse. Discard the cloth and let the area air-dry. Do not put any substances on the area afterward, such as powder, lotion, or perfume. 3. Put on clean clothes or pajamas. 4. If it is the night before your surgery, sleep in clean sheets.  For general bathing Follow these steps when using CHG cloths for general bathing (unless your health care provider gives you different instructions). 1. Use a separate CHG cloth for each area of your body. Make sure you wash between any folds of skin and between your fingers and toes. Wash  your body in the following order, switching to a new cloth after each step: ? The front of your neck, shoulders, and chest. ? Both of your arms, under your arms, and your hands. ? Your stomach and groin area, avoiding the genitals. ? Your right leg and foot. ? Your left leg and foot. ? The back of your neck, your back, and your buttocks. 2. Do not rinse. Discard the cloth and let the area air-dry. Do not put any substances on your  body afterward--such as powder, lotion, or perfume--unless you are told to do so by your health care provider. Only use lotions that are recommended by the manufacturer. 3. Put on clean clothes or pajamas. Contact a health care provider if:  Your skin gets irritated after scrubbing.  You have questions about using your solution or cloth. Get help right away if:  Your eyes become very red or swollen.  Your eyes itch badly.  Your skin itches badly and is red or swollen.  Your hearing changes.  You have trouble seeing.  You have swelling or tingling in your mouth or throat.  You have trouble breathing.  You swallow any chlorhexidine. Summary  Chlorhexidine gluconate (CHG) is a germ-killing (antiseptic) solution that is used to clean the skin. Cleaning your skin with CHG may help to lower your risk for infection.  You may be given CHG to use for bathing. It may be in a bottle or in a prepackaged cloth to use on your skin. Carefully follow your health care provider's instructions and the instructions on the product label.  Do not use CHG if you have a chlorhexidine allergy.  Contact your health care provider if your skin gets irritated after scrubbing. This information is not intended to replace advice given to you by your health care provider. Make sure you discuss any questions you have with your health care provider. Document Revised: 01/05/2019 Document Reviewed: 09/16/2017 Elsevier Patient Education  [place].

## 2020-03-26 ENCOUNTER — Encounter (HOSPITAL_COMMUNITY)
Admission: RE | Admit: 2020-03-26 | Discharge: 2020-03-26 | Disposition: A | Discharge status: Home / Self Care | Payer: 59 | Source: Ambulatory Visit | Attending: Orthopedic Surgery | Admitting: Orthopedic Surgery

## 2020-03-26 ENCOUNTER — Encounter (HOSPITAL_COMMUNITY): Payer: Self-pay

## 2020-03-26 ENCOUNTER — Other Ambulatory Visit (HOSPITAL_COMMUNITY)
Admission: RE | Admit: 2020-03-26 | Discharge: 2020-03-26 | Disposition: A | Discharge status: Home / Self Care | Payer: 59 | Source: Ambulatory Visit | Attending: Orthopedic Surgery | Admitting: Orthopedic Surgery

## 2020-03-26 ENCOUNTER — Other Ambulatory Visit: Payer: Self-pay

## 2020-03-26 DIAGNOSIS — Z01812 Encounter for preprocedural laboratory examination: Secondary | ICD-10-CM | POA: Diagnosis not present

## 2020-03-26 DIAGNOSIS — Z20822 Contact with and (suspected) exposure to covid-19: Secondary | ICD-10-CM | POA: Diagnosis not present

## 2020-03-26 HISTORY — DX: Sleep apnea, unspecified: G47.30

## 2020-03-26 LAB — CBC WITH DIFFERENTIAL/PLATELET
Abs Immature Granulocytes: 0.02 10*3/uL (ref 0.00–0.07)
Basophils Absolute: 0 10*3/uL (ref 0.0–0.1)
Basophils Relative: 1 %
Eosinophils Absolute: 0 10*3/uL (ref 0.0–0.5)
Eosinophils Relative: 0 %
HCT: 44.8 % (ref 39.0–52.0)
Hemoglobin: 15.2 g/dL (ref 13.0–17.0)
Immature Granulocytes: 0 %
Lymphocytes Relative: 46 %
Lymphs Abs: 2.7 10*3/uL (ref 0.7–4.0)
MCH: 32.5 pg (ref 26.0–34.0)
MCHC: 33.9 g/dL (ref 30.0–36.0)
MCV: 95.9 fL (ref 80.0–100.0)
Monocytes Absolute: 0.5 10*3/uL (ref 0.1–1.0)
Monocytes Relative: 10 %
Neutro Abs: 2.4 10*3/uL (ref 1.7–7.7)
Neutrophils Relative %: 43 %
Platelets: 218 10*3/uL (ref 150–400)
RBC: 4.67 MIL/uL (ref 4.22–5.81)
RDW: 12.8 % (ref 11.5–15.5)
WBC: 5.7 10*3/uL (ref 4.0–10.5)
nRBC: 0 % (ref 0.0–0.2)

## 2020-03-26 LAB — BASIC METABOLIC PANEL
Anion gap: 11 (ref 5–15)
BUN: 14 mg/dL (ref 6–20)
CO2: 24 mmol/L (ref 22–32)
Calcium: 9 mg/dL (ref 8.9–10.3)
Chloride: 103 mmol/L (ref 98–111)
Creatinine, Ser: 0.98 mg/dL (ref 0.61–1.24)
GFR calc Af Amer: >60 mL/min (ref >60)
GFR calc non Af Amer: >60 mL/min (ref >60)
Glucose, Bld: 147 mg/dL — ABNORMAL HIGH (ref 70–99)
Potassium: 3.7 mmol/L (ref 3.5–5.1)
Sodium: 138 mmol/L (ref 135–145)

## 2020-03-26 LAB — SARS CORONAVIRUS 2 (TAT 6-24 HRS): SARS Coronavirus 2: NEGATIVE
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
## 2020-03-28 ENCOUNTER — Ambulatory Visit (HOSPITAL_COMMUNITY): Payer: 59 | Admitting: Anesthesiology

## 2020-03-28 ENCOUNTER — Ambulatory Visit (HOSPITAL_COMMUNITY)
Admission: RE | Admit: 2020-03-28 | Discharge: 2020-03-28 | Disposition: A | Discharge status: Home / Self Care | Payer: 59 | Attending: Orthopedic Surgery | Admitting: Orthopedic Surgery

## 2020-03-28 ENCOUNTER — Encounter (HOSPITAL_COMMUNITY): Payer: Self-pay | Admitting: Orthopedic Surgery

## 2020-03-28 ENCOUNTER — Encounter: Payer: Self-pay | Admitting: Orthopedic Surgery

## 2020-03-28 ENCOUNTER — Encounter (HOSPITAL_COMMUNITY)
Admission: RE | Disposition: A | Discharge status: Home / Self Care | Payer: Self-pay | Source: Home / Self Care | Attending: Orthopedic Surgery

## 2020-03-28 DIAGNOSIS — Z8249 Family history of ischemic heart disease and other diseases of the circulatory system: Secondary | ICD-10-CM | POA: Insufficient documentation

## 2020-03-28 DIAGNOSIS — Z8546 Personal history of malignant neoplasm of prostate: Secondary | ICD-10-CM | POA: Insufficient documentation

## 2020-03-28 DIAGNOSIS — G8929 Other chronic pain: Secondary | ICD-10-CM | POA: Diagnosis not present

## 2020-03-28 DIAGNOSIS — E785 Hyperlipidemia, unspecified: Secondary | ICD-10-CM | POA: Insufficient documentation

## 2020-03-28 DIAGNOSIS — M2242 Chondromalacia patellae, left knee: Secondary | ICD-10-CM

## 2020-03-28 DIAGNOSIS — M94262 Chondromalacia, left knee: Secondary | ICD-10-CM | POA: Diagnosis not present

## 2020-03-28 DIAGNOSIS — K219 Gastro-esophageal reflux disease without esophagitis: Secondary | ICD-10-CM | POA: Diagnosis not present

## 2020-03-28 DIAGNOSIS — Z6838 Body mass index (BMI) 38.0-38.9, adult: Secondary | ICD-10-CM | POA: Insufficient documentation

## 2020-03-28 DIAGNOSIS — Z833 Family history of diabetes mellitus: Secondary | ICD-10-CM | POA: Diagnosis not present

## 2020-03-28 DIAGNOSIS — M25562 Pain in left knee: Secondary | ICD-10-CM | POA: Insufficient documentation

## 2020-03-28 DIAGNOSIS — E669 Obesity, unspecified: Secondary | ICD-10-CM | POA: Diagnosis not present

## 2020-03-28 HISTORY — PX: KNEE ARTHROSCOPY: SHX127

## 2020-03-28 HISTORY — PX: CHONDROPLASTY: SHX5177

## 2020-03-28 SURGERY — ARTHROSCOPY, KNEE
Anesthesia: General | Site: Knee | Laterality: Left

## 2020-03-28 MED ORDER — SODIUM CHLORIDE 0.9 % IR SOLN
Status: DC | PRN
  Administered 2020-03-28: 1000 mL

## 2020-03-28 MED ORDER — PROMETHAZINE HCL 12.5 MG PO TABS
12.5000 mg | ORAL_TABLET | Freq: Four times a day (QID) | ORAL | 0 refills | Status: DC | PRN
Start: 2020-03-28 — End: 2020-04-05

## 2020-03-28 MED ORDER — MEPERIDINE HCL 50 MG/ML IJ SOLN: 6.2500 mg | INTRAMUSCULAR | Status: DC | PRN

## 2020-03-28 MED ORDER — ONDANSETRON HCL 4 MG/2ML IJ SOLN
INTRAMUSCULAR | Status: AC
  Filled 2020-03-28: qty 2

## 2020-03-28 MED ORDER — SODIUM CHLORIDE 0.9 % IR SOLN
Status: DC | PRN
  Administered 2020-03-28 (×2): 3000 mL

## 2020-03-28 MED ORDER — LACTATED RINGERS IV SOLN
INTRAVENOUS | Status: DC | PRN
Start: 2020-03-28 — End: 2020-03-28

## 2020-03-28 MED ORDER — LIDOCAINE HCL (CARDIAC) PF 100 MG/5ML IV SOSY
PREFILLED_SYRINGE | INTRAVENOUS | Status: DC | PRN
Start: 2020-03-28 — End: 2020-03-28
  Administered 2020-03-28: 100 mg via INTRAVENOUS

## 2020-03-28 MED ORDER — DEXAMETHASONE SODIUM PHOSPHATE 10 MG/ML IJ SOLN
INTRAMUSCULAR | Status: AC
  Filled 2020-03-28: qty 1

## 2020-03-28 MED ORDER — IBUPROFEN 800 MG PO TABS: 800.0000 mg | ORAL_TABLET | Freq: Once | ORAL | Status: DC

## 2020-03-28 MED ORDER — HYDROMORPHONE HCL 1 MG/ML IJ SOLN: 0.2500 mg | INTRAMUSCULAR | Status: DC | PRN

## 2020-03-28 MED ORDER — EPINEPHRINE PF 1 MG/ML IJ SOLN
INTRAMUSCULAR | Status: AC
  Filled 2020-03-28: qty 4

## 2020-03-28 MED ORDER — DEXMEDETOMIDINE HCL IN NACL 200 MCG/50ML IV SOLN
INTRAVENOUS | Status: DC | PRN
  Administered 2020-03-28: 20 ug via INTRAVENOUS

## 2020-03-28 MED ORDER — BUPIVACAINE-EPINEPHRINE (PF) 0.5% -1:200000 IJ SOLN
INTRAMUSCULAR | Status: AC
  Filled 2020-03-28: qty 60

## 2020-03-28 MED ORDER — CEFAZOLIN SODIUM-DEXTROSE 2-4 GM/100ML-% IV SOLN
2.0000 g | INTRAVENOUS | Status: AC
  Administered 2020-03-28: 2 g via INTRAVENOUS
  Filled 2020-03-28: qty 100

## 2020-03-28 MED ORDER — CHLORHEXIDINE GLUCONATE 0.12 % MT SOLN
15.0000 mL | Freq: Once | OROMUCOSAL | Status: AC
  Administered 2020-03-28: 15 mL via OROMUCOSAL
  Filled 2020-03-28: qty 15

## 2020-03-28 MED ORDER — HYDROCODONE-ACETAMINOPHEN 7.5-325 MG PO TABS: 1.0000 | ORAL_TABLET | Freq: Once | ORAL | Status: DC

## 2020-03-28 MED ORDER — IBUPROFEN 800 MG PO TABS
800.0000 mg | ORAL_TABLET | Freq: Three times a day (TID) | ORAL | 0 refills | Status: DC | PRN
Start: 2020-03-28 — End: 2020-04-05

## 2020-03-28 MED ORDER — PHENYLEPHRINE 40 MCG/ML (10ML) SYRINGE FOR IV PUSH (FOR BLOOD PRESSURE SUPPORT)
PREFILLED_SYRINGE | INTRAVENOUS | Status: AC
  Filled 2020-03-28: qty 10

## 2020-03-28 MED ORDER — MIDAZOLAM HCL 5 MG/5ML IJ SOLN
INTRAMUSCULAR | Status: DC | PRN
  Administered 2020-03-28: 2 mg via INTRAVENOUS

## 2020-03-28 MED ORDER — LIDOCAINE 2% (20 MG/ML) 5 ML SYRINGE
INTRAMUSCULAR | Status: AC
  Filled 2020-03-28: qty 5

## 2020-03-28 MED ORDER — HYDROCODONE-ACETAMINOPHEN 5-325 MG PO TABS: 1.0000 | ORAL_TABLET | ORAL | 0 refills | Status: AC | PRN

## 2020-03-28 MED ORDER — PHENYLEPHRINE HCL (PRESSORS) 10 MG/ML IV SOLN
INTRAVENOUS | Status: DC | PRN
  Administered 2020-03-28: 80 ug via INTRAVENOUS

## 2020-03-28 MED ORDER — FENTANYL CITRATE (PF) 100 MCG/2ML IJ SOLN
INTRAMUSCULAR | Status: DC | PRN
  Administered 2020-03-28 (×2): 50 ug via INTRAVENOUS
  Administered 2020-03-28 (×2): 25 ug via INTRAVENOUS
  Administered 2020-03-28: 50 ug via INTRAVENOUS

## 2020-03-28 MED ORDER — PROMETHAZINE HCL 25 MG/ML IJ SOLN: 6.2500 mg | INTRAMUSCULAR | Status: DC | PRN

## 2020-03-28 MED ORDER — MIDAZOLAM HCL 2 MG/2ML IJ SOLN
INTRAMUSCULAR | Status: AC
  Filled 2020-03-28: qty 2

## 2020-03-28 MED ORDER — ONDANSETRON HCL 4 MG/2ML IJ SOLN
INTRAMUSCULAR | Status: DC | PRN
  Administered 2020-03-28: 4 mg via INTRAVENOUS

## 2020-03-28 MED ORDER — PROPOFOL 10 MG/ML IV BOLUS
INTRAVENOUS | Status: DC | PRN
  Administered 2020-03-28: 180 mg via INTRAVENOUS

## 2020-03-28 MED ORDER — BUPIVACAINE-EPINEPHRINE (PF) 0.5% -1:200000 IJ SOLN
INTRAMUSCULAR | Status: DC | PRN
  Administered 2020-03-28: 60 mL

## 2020-03-28 MED ORDER — FENTANYL CITRATE (PF) 100 MCG/2ML IJ SOLN
INTRAMUSCULAR | Status: AC
  Filled 2020-03-28: qty 2

## 2020-03-28 MED ORDER — LACTATED RINGERS IV SOLN
Freq: Once | INTRAVENOUS | Status: AC
  Administered 2020-03-28: 1000 mL via INTRAVENOUS

## 2020-03-28 MED ORDER — DEXAMETHASONE SODIUM PHOSPHATE 10 MG/ML IJ SOLN
INTRAMUSCULAR | Status: DC | PRN
  Administered 2020-03-28: 8 mg via INTRAVENOUS

## 2020-03-28 MED ORDER — PROPOFOL 10 MG/ML IV BOLUS
INTRAVENOUS | Status: AC
  Filled 2020-03-28: qty 20

## 2020-03-28 MED ORDER — ONDANSETRON HCL 4 MG/2ML IJ SOLN
4.0000 mg | Freq: Once | INTRAMUSCULAR | Status: AC
  Administered 2020-03-28: 4 mg via INTRAVENOUS

## 2020-03-28 MED ORDER — ORAL CARE MOUTH RINSE: 15.0000 mL | Freq: Once | OROMUCOSAL | Status: AC

## 2020-03-28 MED FILL — HYDROCODON-APAP 5-325: 5-325 | 7 days supply | Qty: 42 | Fill #0

## 2020-03-28 MED FILL — IBUPROFEN 800 MG TABS: 800 | 21 days supply | Qty: 63 | Fill #0

## 2020-03-28 MED FILL — PROMETHAZINE 12.5 MG TABLET: 12.5 | 8 days supply | Qty: 30 | Fill #0

## 2020-03-28 SURGICAL SUPPLY — 46 items
APL PRP STRL LF DISP 70% ISPRP (MISCELLANEOUS) ×1
BANDAGE ELASTIC 6 VELCRO NS (GAUZE/BANDAGES/DRESSINGS) ×1 IMPLANT
BLADE SHAVER TORPEDO 4X13 (MISCELLANEOUS) ×1 IMPLANT
BNDG CMPR STD VLCR NS LF 5.8X6 (GAUZE/BANDAGES/DRESSINGS) ×1
BNDG ELASTIC 6X5.8 VLCR NS LF (GAUZE/BANDAGES/DRESSINGS) ×2 IMPLANT
CHLORAPREP W/TINT 26 (MISCELLANEOUS) ×2 IMPLANT
CLOTH BEACON ORANGE TIMEOUT ST (SAFETY) ×2 IMPLANT
COOLER CRYO IC GRAV AND TUBE (ORTHOPEDIC SUPPLIES) ×2 IMPLANT
COVER WAND RF STERILE (DRAPES) ×2 IMPLANT
CUFF CRYO KNEE LG 20X31 COOLER (ORTHOPEDIC SUPPLIES) ×1 IMPLANT
CUFF TOURN SGL QUICK 34 (TOURNIQUET CUFF) ×2
CUFF TRNQT CYL 34X4.125X (TOURNIQUET CUFF) ×2 IMPLANT
DECANTER SPIKE VIAL GLASS SM (MISCELLANEOUS) ×4 IMPLANT
GAUZE 4X4 16PLY RFD (DISPOSABLE) ×2 IMPLANT
GAUZE SPONGE 4X4 12PLY STRL (GAUZE/BANDAGES/DRESSINGS) ×1 IMPLANT
GAUZE SPONGE 4X4 16PLY XRAY LF (GAUZE/BANDAGES/DRESSINGS) ×2 IMPLANT
GAUZE XEROFORM 5X9 LF (GAUZE/BANDAGES/DRESSINGS) ×2 IMPLANT
GLOVE BIO SURGEON STRL SZ7 (GLOVE) ×1 IMPLANT
GLOVE BIOGEL PI IND STRL 7.0 (GLOVE) ×2 IMPLANT
GLOVE BIOGEL PI INDICATOR 7.0 (GLOVE) ×2
GLOVE SKINSENSE NS SZ8.0 LF (GLOVE) ×1
GLOVE SKINSENSE STRL SZ8.0 LF (GLOVE) ×1 IMPLANT
GLOVE SS N UNI LF 8.5 STRL (GLOVE) ×2 IMPLANT
GOWN STRL REUS W/TWL LRG LVL3 (GOWN DISPOSABLE) ×2 IMPLANT
GOWN STRL REUS W/TWL XL LVL3 (GOWN DISPOSABLE) ×2 IMPLANT
IV NS IRRIG 3000ML ARTHROMATIC (IV SOLUTION) ×4 IMPLANT
KIT BLADEGUARD II DBL (SET/KITS/TRAYS/PACK) ×2 IMPLANT
KIT TURNOVER CYSTO (KITS) ×2 IMPLANT
MANIFOLD NEPTUNE II (INSTRUMENTS) ×2 IMPLANT
NDL HYPO 18GX1.5 BLUNT FILL (NEEDLE) ×1 IMPLANT
NDL HYPO 21X1.5 SAFETY (NEEDLE) ×1 IMPLANT
NEEDLE HYPO 18GX1.5 BLUNT FILL (NEEDLE) ×2 IMPLANT
NEEDLE HYPO 21X1.5 SAFETY (NEEDLE) ×2 IMPLANT
NS IRRIG 1000ML POUR BTL (IV SOLUTION) ×2 IMPLANT
PACK ARTHROSCOPY WL (CUSTOM PROCEDURE TRAY) ×1 IMPLANT
PAD ABD 5X9 TENDERSORB (GAUZE/BANDAGES/DRESSINGS) ×3 IMPLANT
PAD ARMBOARD 7.5X6 YLW CONV (MISCELLANEOUS) ×2 IMPLANT
PADDING CAST COTTON 6X4 STRL (CAST SUPPLIES) ×2 IMPLANT
PICK POWER XL 45DEG (MISCELLANEOUS) ×1 IMPLANT
PORT APPOLLO RF 90DEGREE MULTI (SURGICAL WAND) ×1 IMPLANT
SET ARTHROSCOPY INST (INSTRUMENTS) ×2 IMPLANT
SET BASIN LINEN APH (SET/KITS/TRAYS/PACK) ×2 IMPLANT
SUT ETHILON 3 0 FSL (SUTURE) ×2 IMPLANT
SYR 30ML LL (SYRINGE) ×2 IMPLANT
TUBE CONNECTING 12X1/4 (SUCTIONS) ×5 IMPLANT
TUBING IN/OUT FLOW W/MAIN PUMP (TUBING) ×1 IMPLANT

## 2020-03-28 NOTE — Anesthesia Procedure Notes (Signed)
Procedure Name: LMA Insertion Performed by: Claritza July L, CRNA Pre-anesthesia Checklist: Patient identified, Emergency Drugs available, Suction available, Patient being monitored and Timeout performed Patient Re-evaluated:Patient Re-evaluated prior to induction Oxygen Delivery Method: Circle system utilized Preoxygenation: Pre-oxygenation with 100% oxygen Induction Type: IV induction LMA: LMA inserted LMA Size: 5.0 Number of attempts: 1 Placement Confirmation: positive ETCO2,  CO2 detector and breath sounds checked- equal and bilateral Tube secured with: Tape Dental Injury: Teeth and Oropharynx as per pre-operative assessment        

## 2020-03-28 NOTE — Brief Op Note (Signed)
03/28/2020  9:53 AM  PATIENT:  Anthony Erickson  59 y.o. male  PRE-OPERATIVE DIAGNOSIS:  left knee synovitis  POST-OPERATIVE DIAGNOSIS:  left knee chondromalacia of the patella    FINDINGS:  Medial compartment normal meniscus normal articular surfaces  Lateral compartment normal meniscus, normal articular surfaces  Notch ACL and PCL are intact  Patellofemoral joint: 5 to 7 mm lateral overhang of the patella in extension and flexion, 3 distinct areas of chondromalacia: #1 superomedial grade II chondromalacia with crabmeat appearance, #2 central ridge softening and fissuring-5 mm lesion, #3 inferior lateral 8 to 10 mm area of grade I chondromalacia with 1 central area 5 mm wide by 5 mm long with grade 4 chondral flap.   PROCEDURE:  Procedure(s): LEFT KNEE ARTHROSCOPIC CHONDROPLASTY OF PATELLA (Left)  SURGEON:  Surgeon(s) and Role:    * Carole Civil, MD - Primary  PHYSICIAN ASSISTANT:   ASSISTANTS: none   ANESTHESIA:   none  EBL:  10 mL   BLOOD ADMINISTERED:none  DRAINS: none   LOCAL MEDICATIONS USED:  MARCAINE     SPECIMEN:  No Specimen  DISPOSITION OF SPECIMEN:  N/A  COUNTS:  YES  TOURNIQUET:  * Missing tourniquet times found for documented tourniquets in log: [id number] *  DICTATION: .Dragon Dictation  PLAN OF CARE: Discharge to home after PACU  PATIENT DISPOSITION:  PACU - hemodynamically stable.   Delay start of Pharmacological VTE agent (>24hrs) due to surgical blood loss or risk of bleeding: not applicable   [name] was seen in the preop area chart was reviewed surgical site left knee confirmed.  Chart review and consent completed.  He was taken to the operating room for general anesthesia in the supine position  His left leg was prepped and draped sterilely.  Timeout was taken and completed  The scope was placed through a lateral portal followed by diagnostic arthroscopy.  Medial portal was made and intra-articular structures were probed ACL  and PCL were stable medial lateral meniscus were normal.  The articular surfaces of the joint were normal with exception of the patellofemoral joint.  The superomedial patellar lesion was debrided with a shaver.  The central ridge was stabilized with an ArthroCare wand (setting #1, coag).  The inferolateral lesion was debrided there was a chondral flap which resulted in a grade 4 lesion.  , the scope was placed into the medial portal and a separate portal was made using a spinal needle to access the grade 4 lesion and a micropick was used to create a microfracture of this lesion. Lateral to the lateral portion of the patella there was thickened synovium which was also debrided  The arthroscopic pump was turned off to confirm subchondral bone entry by evidence of bleeding from the microfracture site.  The joint was then irrigated and closed with #3-0 nylon suture x3 for the 3 portals  The joint was injected with Marcaine and epinephrine.  Followed by Xeroform 4 x 4's ABD dressing, Ace bandage and Cryo/Cuff which was activated  After extubation patient was taken to recovery room in stable condition  Postop plan  Weight-bear as tolerated active range of motion  Emphasize quadricep strengthening and VMO.

## 2020-03-28 NOTE — Transfer of Care (Signed)
Immediate Anesthesia Transfer of Care Note  Patient: Anthony Erickson  Procedure(s) Performed: LEFT KNEE ARTHROSCOPIC SYNOVECTOMY (Left Knee) CHONDROPLASTY OF PATELLA (Left Knee)  Patient Location: PACU  Anesthesia Type:General  Level of Consciousness: sedated, drowsy and patient cooperative  Airway & Oxygen Therapy: Patient Spontanous Breathing and Patient connected to nasal cannula oxygen  Post-op Assessment: Report given to RN and Post -op Vital signs reviewed and stable  Post vital signs: Reviewed and stable  Last Vitals:  Vitals Value Taken Time  BP 120/73 03/28/20 1000  Temp    Pulse 94 03/28/20 1004  Resp 16 03/28/20 1004  SpO2 92 % 03/28/20 1004  Vitals shown include unvalidated device data.  Last Pain:  Vitals:   03/28/20 0734  TempSrc: Oral  PainSc: 0-No pain      Patients Stated Pain Goal: 5 ([id number])  Complications: No apparent anesthesia complications

## 2020-03-28 NOTE — Op Note (Addendum)
03/28/2020  9:53 AM  PATIENT:  Anthony Erickson  59 y.o. male  PRE-OPERATIVE DIAGNOSIS:  left knee synovitis  POST-OPERATIVE DIAGNOSIS:  left knee chondromalacia of the patella    FINDINGS:  Medial compartment normal meniscus normal articular surfaces  Lateral compartment normal meniscus, normal articular surfaces  Notch ACL and PCL are intact  Patellofemoral joint: 5 to 7 mm lateral overhang of the patella in extension and flexion, 3 distinct areas of chondromalacia: #1 superomedial grade II chondromalacia with crabmeat appearance, #2 central ridge softening and fissuring-5 mm lesion, #3 inferior lateral 8 to 10 mm area of grade I chondromalacia with 1 central area 5 mm wide by 5 mm long with grade 4 chondral flap.   PROCEDURE:  Procedure(s): LEFT KNEE ARTHROSCOPIC CHONDROPLASTY AND MICROFRACTURE OF PATELLA (Left) [id number]  SURGEON:  Surgeon(s) and Role:    * Carole Civil, MD - Primary  PHYSICIAN ASSISTANT:   ASSISTANTS: none   ANESTHESIA:   none  EBL:  10 mL   BLOOD ADMINISTERED:none  DRAINS: none   LOCAL MEDICATIONS USED:  MARCAINE     SPECIMEN:  No Specimen  DISPOSITION OF SPECIMEN:  N/A  COUNTS:  YES  TOURNIQUET:  * Missing tourniquet times found for documented tourniquets in log: [id number] *  DICTATION: .Dragon Dictation  PLAN OF CARE: Discharge to home after PACU  PATIENT DISPOSITION:  PACU - hemodynamically stable.   Delay start of Pharmacological VTE agent (>24hrs) due to surgical blood loss or risk of bleeding: not applicable   Anthony Erickson was seen in the preop area chart was reviewed surgical site left knee confirmed.  Chart review and consent completed.  He was taken to the operating room for general anesthesia in the supine position  His left leg was prepped and draped sterilely.  Timeout was taken and completed  The scope was placed through a lateral portal followed by diagnostic arthroscopy.  Medial portal was made and intra-articular  structures were probed ACL and PCL were stable medial lateral meniscus were normal.  The articular surfaces of the joint were normal with exception of the patellofemoral joint.  The superomedial patellar lesion was debrided with a shaver.  The central ridge was stabilized with an ArthroCare wand (setting #1, coag).  The inferolateral lesion was debrided there was a chondral flap which resulted in a grade 4 lesion.  , the scope was placed into the medial portal and a separate portal was made using a spinal needle to access the grade 4 lesion and a micropick was used to create a microfracture of this lesion. Lateral to the lateral portion of the patella there was thickened synovium which was also debrided  The arthroscopic pump was turned off to confirm subchondral bone entry by evidence of bleeding from the microfracture site.  The joint was then irrigated and closed with #3-0 nylon suture x3 for the 3 portals  The joint was injected with Marcaine and epinephrine.  Followed by Xeroform 4 x 4's ABD dressing, Ace bandage and Cryo/Cuff which was activated  After extubation patient was taken to recovery room in stable condition  Postop plan  Weight-bear as tolerated active range of motion  Emphasize quadricep strengthening and VMO.

## 2020-03-28 NOTE — Anesthesia Preprocedure Evaluation (Signed)
Anesthesia Evaluation  Patient identified by MRN, date of birth, ID band Patient awake    Reviewed: Allergy & Precautions, NPO status , Patient's Chart, lab work & pertinent test results  Airway Mallampati: III  TM Distance: >3 FB Neck ROM: Full    Dental  (+) Poor Dentition, Missing, Dental Advisory Given,    Pulmonary sleep apnea ,    Pulmonary exam normal breath sounds clear to auscultation       Cardiovascular Exercise Tolerance: Good negative cardio ROS Normal cardiovascular exam Rhythm:Regular Rate:Normal     Neuro/Psych negative neurological ROS  negative psych ROS   GI/Hepatic Neg liver ROS, GERD  Controlled,  Endo/Other  negative endocrine ROS  Renal/GU negative Renal ROS  negative genitourinary   Musculoskeletal negative musculoskeletal ROS (+)   Abdominal   Peds negative pediatric ROS (+)  Hematology negative hematology ROS (+)   Anesthesia Other Findings   Reproductive/Obstetrics negative OB ROS                            Anesthesia Physical Anesthesia Plan  ASA: II  Anesthesia Plan: General   Post-op Pain Management:    Induction: Intravenous  PONV Risk Score and Plan: 3 and Ondansetron, Dexamethasone and Midazolam  Airway Management Planned: LMA  Additional Equipment:   Intra-op Plan:   Post-operative Plan: Extubation in OR  Informed Consent: I have reviewed the patients History and Physical, chart, labs and discussed the procedure including the risks, benefits and alternatives for the proposed anesthesia with the patient or authorized representative who has indicated his/her understanding and acceptance.     Dental advisory given  Plan Discussed with: CRNA and Surgeon  Anesthesia Plan Comments:        Anesthesia Quick Evaluation

## 2020-03-28 NOTE — Anesthesia Postprocedure Evaluation (Signed)
Anesthesia Post Note  Patient: Anthony Erickson  Procedure(s) Performed: LEFT KNEE ARTHROSCOPIC SYNOVECTOMY (Left Knee) CHONDROPLASTY OF PATELLA (Left Knee)  Patient location during evaluation: PACU Anesthesia Type: General Level of consciousness: awake, oriented, awake and alert and patient cooperative Pain management: pain level controlled Vital Signs Assessment: post-procedure vital signs reviewed and stable Respiratory status: spontaneous breathing, respiratory function stable and nonlabored ventilation Cardiovascular status: blood pressure returned to baseline and stable Postop Assessment: no headache and no backache Anesthetic complications: no     Last Vitals:  Vitals:   03/28/20 0734  BP: (!) 136/92  Pulse: (!) 104  Resp: 16  Temp: 36.5 C  SpO2: 98%    Last Pain:  Vitals:   03/28/20 0734  TempSrc: Oral  PainSc: 0-No pain                 Tacy Learn

## 2020-03-28 NOTE — Interval H&P Note (Signed)
History and Physical Interval Note:  03/28/2020 8:47 AM  Anthony Erickson  has presented today for surgery, with the diagnosis of left knee synovitis.  The various methods of treatment have been discussed with the patient and family. After consideration of risks, benefits and other options for treatment, the patient has consented to  Procedure(s): LEFT KNEE ARTHROSCOPIC SYNOVECTOMY (Left) as a surgical intervention.  The patient's history has been reviewed, patient examined, no change in status, stable for surgery.  I have reviewed the patient's chart and labs.  Questions were answered to the patient's satisfaction.     Arther Abbott

## 2020-03-28 NOTE — Discharge Instructions (Signed)
General Anesthesia, Adult, Care After This sheet gives you information about how to care for yourself after your procedure. Your health care provider may also give you more specific instructions. If you have problems or questions, contact your health care provider. What can I expect after the procedure? After the procedure, the following side effects are common:  Pain or discomfort at the IV site.  Nausea.  Vomiting.  Sore throat.  Trouble concentrating.  Feeling cold or chills.  Weak or tired.  Sleepiness and fatigue.  Soreness and body aches. These side effects can affect parts of the body that were not involved in surgery. Follow these instructions at home:  For at least 24 hours after the procedure:  Have a responsible adult stay with you. It is important to have someone help care for you until you are awake and alert.  Rest as needed.  Do not: ? Participate in activities in which you could fall or become injured. ? Drive. ? Use heavy machinery. ? Drink alcohol. ? Take sleeping pills or medicines that cause drowsiness. ? Make important decisions or sign legal documents. ? Take care of children on your own. Eating and drinking  Follow any instructions from your health care provider about eating or drinking restrictions.  When you feel hungry, start by eating small amounts of foods that are soft and easy to digest (bland), such as toast. Gradually return to your regular diet.  Drink enough fluid to keep your urine pale yellow.  If you vomit, rehydrate by drinking water, juice, or clear broth. General instructions  If you have sleep apnea, surgery and certain medicines can increase your risk for breathing problems. Follow instructions from your health care provider about wearing your sleep device: ? Anytime you are sleeping, including during daytime naps. ? While taking prescription pain medicines, sleeping medicines, or medicines that make you drowsy.  Return to  your normal activities as told by your health care provider. Ask your health care provider what activities are safe for you.  Take over-the-counter and prescription medicines only as told by your health care provider.  If you smoke, do not smoke without supervision.  Keep all follow-up visits as told by your health care provider. This is important. Contact a health care provider if:  You have nausea or vomiting that does not get better with medicine.  You cannot eat or drink without vomiting.  You have pain that does not get better with medicine.  You are unable to pass urine.  You develop a skin rash.  You have a fever.  You have redness around your IV site that gets worse. Get help right away if:  You have difficulty breathing.  You have chest pain.  You have blood in your urine or stool, or you vomit blood. Summary  After the procedure, it is common to have a sore throat or nausea. It is also common to feel tired.  Have a responsible adult stay with you for the first 24 hours after general anesthesia. It is important to have someone help care for you until you are awake and alert.  When you feel hungry, start by eating small amounts of foods that are soft and easy to digest (bland), such as toast. Gradually return to your regular diet.  Drink enough fluid to keep your urine pale yellow.  Return to your normal activities as told by your health care provider. Ask your health care provider what activities are safe for you. This information is not   intended to replace advice given to you by your health care provider. Make sure you discuss any questions you have with your health care provider. Document Revised: 10/22/2017 Document Reviewed: 06/04/2017 Elsevier Patient Education  2020 [employer]  

## 2020-04-05 ENCOUNTER — Encounter: Payer: Self-pay | Admitting: Orthopedic Surgery

## 2020-04-05 ENCOUNTER — Other Ambulatory Visit: Payer: Self-pay

## 2020-04-05 ENCOUNTER — Ambulatory Visit (INDEPENDENT_AMBULATORY_CARE_PROVIDER_SITE_OTHER): Payer: 59 | Admitting: Orthopedic Surgery

## 2020-04-05 DIAGNOSIS — Z4889 Encounter for other specified surgical aftercare: Secondary | ICD-10-CM

## 2020-04-05 NOTE — Patient Instructions (Signed)
Ice   Exercises

## 2020-04-05 NOTE — Progress Notes (Signed)
Chief Complaint  Patient presents with  . Routine Post Op    03/28/20 left knee post op improving sutures removed    Anthony Erickson is doing well he is status post left knee arthroscopy chondroplasty of 3 separate areas and microfracture of one of the areas of the patella with synovectomy  He is on Tylenol for pain the ibuprofen upset his stomach he says he does not need anything else  The portals were removed  He had some swelling we expect from the microfracture procedure  He can bend his knee almost to 90 degrees and almost has full extension  No calf tenderness pain or distal edema  Recommend home exercises follow-up in 3 weeks    PROCEDURE:  Procedure(s): LEFT KNEE ARTHROSCOPIC CHONDROPLASTY AND MICROFRACTURE OF PATELLA (Left) [phone]

## 2020-04-25 ENCOUNTER — Ambulatory Visit: Payer: 59 | Admitting: Family Medicine

## 2020-04-26 ENCOUNTER — Encounter: Payer: Self-pay | Admitting: Orthopedic Surgery

## 2020-04-26 ENCOUNTER — Ambulatory Visit (INDEPENDENT_AMBULATORY_CARE_PROVIDER_SITE_OTHER): Payer: 59 | Admitting: Orthopedic Surgery

## 2020-04-26 ENCOUNTER — Other Ambulatory Visit: Payer: Self-pay

## 2020-04-26 VITALS — Ht 66.0 in | Wt 242.0 lb

## 2020-04-26 DIAGNOSIS — Z4889 Encounter for other specified surgical aftercare: Secondary | ICD-10-CM

## 2020-04-26 NOTE — Patient Instructions (Signed)
RTW Monday   SLEEVE FROM [facility]

## 2020-04-26 NOTE — Progress Notes (Signed)
Chief Complaint  Patient presents with  . Follow-up    Recheck on left knee, DOS 03-28-20.   Anthony Erickson is 1 month out from his chondroplasty and microfracture of his patella for chondromalacia and grade 4 full-thickness cartilage defect  He is doing well he is regained full range of motion no swelling no pain  Only complaint is a click on knee flexion  Recommend knee sleeve  Physical Exam Musculoskeletal:       Legs:     Comments: Right and left portal sites are clean there is no effusion full range of motion good quadriceps control      He can go back to work on Monday.  Encounter Diagnosis  Name Primary?  Marland Kitchen Aftercare following surgery Yes

## 2020-05-09 ENCOUNTER — Encounter: Payer: 59 | Admitting: Family Medicine

## 2020-06-17 ENCOUNTER — Encounter: Payer: 59 | Admitting: Family Medicine

## 2020-06-24 ENCOUNTER — Other Ambulatory Visit: Payer: Self-pay

## 2020-06-24 ENCOUNTER — Telehealth: Payer: Self-pay

## 2020-06-24 DIAGNOSIS — E785 Hyperlipidemia, unspecified: Secondary | ICD-10-CM

## 2020-06-24 DIAGNOSIS — E559 Vitamin D deficiency, unspecified: Secondary | ICD-10-CM

## 2020-06-24 NOTE — Telephone Encounter (Signed)
Left message letting patient know that labs were ordered

## 2020-06-24 NOTE — Telephone Encounter (Signed)
Fasting lipid, hepatic , TSH and vit D , thanks

## 2020-06-24 NOTE — Telephone Encounter (Signed)
Pt is inquiring about blood work before the physical schedule 07/01/20

## 2020-06-24 NOTE — Telephone Encounter (Signed)
What labs would you like him to have drawn?

## 2020-06-25 ENCOUNTER — Other Ambulatory Visit: Payer: Self-pay

## 2020-06-25 DIAGNOSIS — E559 Vitamin D deficiency, unspecified: Secondary | ICD-10-CM | POA: Diagnosis not present

## 2020-06-25 DIAGNOSIS — R972 Elevated prostate specific antigen [PSA]: Secondary | ICD-10-CM

## 2020-06-25 DIAGNOSIS — C61 Malignant neoplasm of prostate: Secondary | ICD-10-CM

## 2020-06-25 DIAGNOSIS — E785 Hyperlipidemia, unspecified: Secondary | ICD-10-CM | POA: Diagnosis not present

## 2020-06-26 ENCOUNTER — Telehealth: Payer: Self-pay

## 2020-06-26 ENCOUNTER — Other Ambulatory Visit: Payer: Self-pay

## 2020-06-26 DIAGNOSIS — R972 Elevated prostate specific antigen [PSA]: Secondary | ICD-10-CM

## 2020-06-26 LAB — HEPATIC FUNCTION PANEL
ALT: 28 IU/L (ref 0–44)
AST: 23 IU/L (ref 0–40)
Albumin: 4.6 g/dL (ref 3.8–4.9)
Alkaline Phosphatase: 97 IU/L (ref 48–121)
Bilirubin Total: 0.6 mg/dL (ref 0.0–1.2)
Bilirubin, Direct: 0.17 mg/dL (ref 0.00–0.40)
Total Protein: 7.1 g/dL (ref 6.0–8.5)

## 2020-06-26 LAB — LIPID PANEL
Chol/HDL Ratio: 4.1 ratio (ref 0.0–5.0)
Cholesterol, Total: 157 mg/dL (ref 100–199)
HDL: 38 mg/dL — ABNORMAL LOW (ref >39)
LDL Chol Calc (NIH): 88 mg/dL (ref 0–99)
Triglycerides: 183 mg/dL — ABNORMAL HIGH (ref 0–149)
VLDL Cholesterol Cal: 31 mg/dL (ref 5–40)

## 2020-06-26 LAB — PSA: Prostate Specific Ag, Serum: 0.2 ng/mL (ref 0.0–4.0)

## 2020-06-26 LAB — VITAMIN D 25 HYDROXY (VIT D DEFICIENCY, FRACTURES): Vit D, 25-Hydroxy: 13.9 ng/mL — ABNORMAL LOW (ref 30.0–100.0)

## 2020-06-26 LAB — TSH: TSH: 1.29 u[IU]/mL (ref 0.450–4.500)

## 2020-06-26 NOTE — Telephone Encounter (Signed)
-----   Message from Irine Seal, MD sent at 06/26/2020 10:56 AM EDT ----- PSA is back down.  I don't see a f/u with me.  Was he one who was rescheduled.  If he doesn't have an appointment we could just get him back in 6 months with a PSA.

## 2020-06-26 NOTE — Telephone Encounter (Signed)
Pt notified of results. appt made for lab and ov. Mailed to pt.

## 2020-07-01 ENCOUNTER — Ambulatory Visit (INDEPENDENT_AMBULATORY_CARE_PROVIDER_SITE_OTHER): Payer: 59 | Admitting: Family Medicine

## 2020-07-01 ENCOUNTER — Encounter: Payer: Self-pay | Admitting: Family Medicine

## 2020-07-01 ENCOUNTER — Other Ambulatory Visit: Payer: Self-pay

## 2020-07-01 VITALS — BP 131/92 | HR 98 | Resp 16 | Ht 66.0 in | Wt 242.0 lb

## 2020-07-01 DIAGNOSIS — Z23 Encounter for immunization: Secondary | ICD-10-CM

## 2020-07-01 DIAGNOSIS — R7309 Other abnormal glucose: Secondary | ICD-10-CM

## 2020-07-01 DIAGNOSIS — Z0000 Encounter for general adult medical examination without abnormal findings: Secondary | ICD-10-CM

## 2020-07-01 DIAGNOSIS — R7302 Impaired glucose tolerance (oral): Secondary | ICD-10-CM | POA: Insufficient documentation

## 2020-07-01 LAB — POCT GLYCOSYLATED HEMOGLOBIN (HGB A1C)
HbA1c POC (<> result, manual entry): 5.3 % (ref 4.0–5.6)
HbA1c, POC (controlled diabetic range): 5.3 % (ref 0.0–7.0)
HbA1c, POC (prediabetic range): 5.3 % — AB (ref 5.7–6.4)
Hemoglobin A1C: 5.3 % (ref 4.0–5.6)

## 2020-07-01 NOTE — Patient Instructions (Addendum)
F/U in office in early December, re evaluate weight and blood pressure, call if you need me sooner  Flu vaccine today 1800 calorie and 2000 calorie diet to be provided  GlycoHB today due to elevated blood sugar reading in May  I recommend going through [facility] for counseling to deal with  The stress you are having since your Dad passed, nurse will assist with this    Please change food choice, portion size and commit to exercise every day, so that you lose weight and improve your blood pressure  Thanks for choosing [facility], we consider it a privelige to serve you.

## 2020-07-01 NOTE — Progress Notes (Signed)
Anthony Erickson     MRN: [id number]      DOB: May 08, 1961   HPI: Patient is in for annual physical exam. C/o ongoing stress due to break down in relationship with 3 of his siblings will seek counseling, no actively depressed, but concerned and stressed about the " new normal" Recent labs, are reviewed. Immunization is reviewed , and  updated if needed. C/o weight gain and  insufficient exercise, intends to work on this    PE; BP (!) 131/92   Pulse 98   Resp 16   Ht 5\' 6"  (1.676 m)   Wt 242 lb 0.6 oz (109.8 kg)   SpO2 96%   BMI 39.07 kg/m   Pleasant male, alert and oriented x 3, in no cardio-pulmonary distress. Afebrile. HEENT No facial trauma or asymetry. Sinuses non tender. EOMI External ears normal,  Neck: supple, no adenopathy,JVD or thyromegaly.No bruits.  Chest: Clear to ascultation bilaterally.No crackles or wheezes. Non tender to palpation  Cardiovascular system; Heart sounds normal,  S1 and  S2 ,no S3.  No murmur, or thrill. Apical beat not displaced Peripheral pulses normal.  Abdomen: Soft, non tender, no organomegaly or masses. No bruits. Bowel sounds normal. No guarding, tenderness or rebound.    Musculoskeletal exam: Full ROM of spine, hips , shoulders and knees. No deformity ,swelling or crepitus noted. No muscle wasting or atrophy.   Neurologic: Cranial nerves 2 to 12 intact. Power, tone ,sensation and reflexes normal throughout. No disturbance in gait. No tremor.  Skin: Intact, no ulceration, erythema , scaling or rash noted. Pigmentation normal throughout  Psych; Normal mood and affect. Judgement and concentration normal   Assessment & Plan:  Annual physical exam Annual exam as documented. Counseling done  re healthy lifestyle involving commitment to 150 minutes exercise per week, heart healthy diet, and attaining healthy weight.The importance of adequate sleep also discussed. Regular seat belt use and home safety, is also  discussed. Changes in health habits are decided on by the patient with goals and time frames  set for achieving them. Immunization and cancer screening needs are specifically addressed at this visit.   IGT (impaired glucose tolerance) Patient educated about the importance of limiting  Carbohydrate intake , the need to commit to daily physical activity for a minimum of 30 minutes , and to commit weight loss. The fact that changes in all these areas will reduce or eliminate all together the development of diabetes is stressed.  Normoglycemic, he will continue to be careful as far as carb intake is concened Diabetic Labs Latest Ref Rng & Units 07/01/2020 07/01/2020 07/01/2020 07/01/2020 06/25/2020  HbA1c 4.0 - 5.6 % 5.3(A) 5.3 5.3 5.3 -  Chol 100 - 199 mg/dL - - - - 157  HDL >39 mg/dL - - - - 38(L)  Calc LDL 0 - 99 mg/dL - - - - 88  Triglycerides 0 - 149 mg/dL - - - - 183(H)  Creatinine 0.61 - 1.24 mg/dL - - - - -   BP/Weight 07/01/2020 04/26/2020 03/28/2020 03/26/2020 02/12/2020 11/06/2019 0/35/4656  Systolic BP 812 - 751 700 - 174 944  Diastolic BP 92 - 77 73 - 64 64  Wt. (Lbs) 242.04 242 - 232 237.38 232 232  BMI 39.07 39.06 - 37.45 38.31 37.45 37.45   No flowsheet data found.    Morbid obesity ([place])  Patient re-educated about  the importance of commitment to a  minimum of 150 minutes of exercise per week as able.  The  importance of healthy food choices with portion control discussed, as well as eating regularly and within a 12 hour window most days. The need to choose "clean , green" food 50 to 75% of the time is discussed, as well as to make water the primary drink and set a goal of 64 ounces water daily.    Weight /BMI 07/01/2020 04/26/2020 03/26/2020  WEIGHT 242 lb 0.6 oz 242 lb 232 lb  HEIGHT 5\' 6"  5\' 6"  5\' 6"   BMI 39.07 kg/m2 39.06 kg/m2 37.45 kg/m2

## 2020-07-01 NOTE — Assessment & Plan Note (Signed)

## 2020-07-07 ENCOUNTER — Encounter: Payer: Self-pay | Admitting: Family Medicine

## 2020-07-07 NOTE — Assessment & Plan Note (Signed)
Patient educated about the importance of limiting  Carbohydrate intake , the need to commit to daily physical activity for a minimum of 30 minutes , and to commit weight loss. The fact that changes in all these areas will reduce or eliminate all together the development of diabetes is stressed.  Normoglycemic, he will continue to be careful as far as carb intake is concened Diabetic Labs Latest Ref Rng & Units 07/01/2020 07/01/2020 07/01/2020 07/01/2020 06/25/2020  HbA1c 4.0 - 5.6 % 5.3(A) 5.3 5.3 5.3 -  Chol 100 - 199 mg/dL - - - - 157  HDL >39 mg/dL - - - - 38(L)  Calc LDL 0 - 99 mg/dL - - - - 88  Triglycerides 0 - 149 mg/dL - - - - 183(H)  Creatinine 0.61 - 1.24 mg/dL - - - - -   BP/Weight 07/01/2020 04/26/2020 03/28/2020 03/26/2020 02/12/2020 11/06/2019 6/80/8811  Systolic BP 031 - 594 585 - 929 244  Diastolic BP 92 - 77 73 - 64 64  Wt. (Lbs) 242.04 242 - 232 237.38 232 232  BMI 39.07 39.06 - 37.45 38.31 37.45 37.45   No flowsheet data found.

## 2020-07-07 NOTE — Assessment & Plan Note (Signed)
  Patient re-educated about  the importance of commitment to a  minimum of 150 minutes of exercise per week as able.  The importance of healthy food choices with portion control discussed, as well as eating regularly and within a 12 hour window most days. The need to choose "clean , green" food 50 to 75% of the time is discussed, as well as to make water the primary drink and set a goal of 64 ounces water daily.    Weight /BMI 07/01/2020 04/26/2020 03/26/2020  WEIGHT 242 lb 0.6 oz 242 lb 232 lb  HEIGHT 5\' 6"  5\' 6"  5\' 6"   BMI 39.07 kg/m2 39.06 kg/m2 37.45 kg/m2

## 2020-08-19 ENCOUNTER — Ambulatory Visit (INDEPENDENT_AMBULATORY_CARE_PROVIDER_SITE_OTHER): Payer: 59 | Admitting: Family Medicine

## 2020-08-19 ENCOUNTER — Encounter: Payer: Self-pay | Admitting: Family Medicine

## 2020-08-19 ENCOUNTER — Other Ambulatory Visit: Payer: Self-pay

## 2020-08-19 VITALS — Resp 16 | Ht 66.0 in | Wt 246.0 lb

## 2020-08-19 DIAGNOSIS — K219 Gastro-esophageal reflux disease without esophagitis: Secondary | ICD-10-CM

## 2020-08-19 DIAGNOSIS — E785 Hyperlipidemia, unspecified: Secondary | ICD-10-CM

## 2020-08-19 DIAGNOSIS — R42 Dizziness and giddiness: Secondary | ICD-10-CM | POA: Diagnosis not present

## 2020-08-19 MED ORDER — PANTOPRAZOLE SODIUM 40 MG PO TBEC
40.0000 mg | DELAYED_RELEASE_TABLET | Freq: Every day | ORAL | 1 refills | Status: DC
Start: 2020-08-19 — End: 2020-08-19

## 2020-08-19 MED ORDER — MECLIZINE HCL 25 MG PO TABS: 25.0000 mg | ORAL_TABLET | Freq: Three times a day (TID) | ORAL | 0 refills | Status: DC | PRN

## 2020-08-19 MED ORDER — PANTOPRAZOLE SODIUM 40 MG PO TBEC
40.0000 mg | DELAYED_RELEASE_TABLET | Freq: Every day | ORAL | 1 refills | Status: DC
Start: 2020-08-19 — End: 2021-04-29

## 2020-08-19 NOTE — Progress Notes (Signed)
   Anthony Erickson     MRN: [id number]      DOB: 1961-08-18   HPI Anthony Erickson is here with a 1 week h/o new onset dizziness which started on 08/12/2020 he awoke with acute dizziness. No fever , respiratory symptoms, appetite change , vision disturbance  , no nausea , accompanying this . No falls , no aggravated by turning the head. First episode. Worked Monday and Tuesday, did not work Wednesday or Friday due to vertigo Had frontal headache Monday , Tuesday and  Friday, came to office on Friday with severe headache pounding, declined a visit , opting to wait to see me on Monday No episodes of dizziness on the weekend, was dizzy on Friday, and headache has now resolved ROS Denies recent fever or chills. Denies sinus pressure, nasal congestion, ear pain or sore throat. Denies chest congestion, productive cough or wheezing. Denies chest pains, palpitations and leg swelling Denies abdominal pain, nausea, vomiting,diarrhea or constipation.   Denies dysuria, frequency, hesitancy or incontinence. Denies joint pain, swelling and limitation in mobility. Denies seizures, numbness, or tingling. Denies depression, anxiety or insomnia. Denies skin break down or rash.   PE  Resp 16   Ht 5\' 6"  (1.676 m)   Wt 246 lb (111.6 kg)   SpO2 100%   BMI 39.71 kg/m   Patient alert and oriented and in no cardiopulmonary distress.  HEENT: No facial asymmetry, EOMI,     Neck supple .No nystagmus  Chest: Clear to auscultation bilaterally.  CVS: S1, S2 no murmurs, no S3.Regular rate.  ABD: Soft non tender.   Ext: No edema  MS: Adequate ROM spine, shoulders, hips and knees.  Skin: Intact, no ulcerations or rash noted.  Psych: Good eye contact, normal affect. Memory intact not anxious or depressed appearing.  CNS: CN 2-12 intact, power,  normal throughout.no focal deficits noted.   Assessment & Plan  Vertigo First episode, duration approx 1 week, reducing spontaneously in severity. Educated re  diagnosis, limited amount of meclizine prescribed, will refer to ENT if persists or worsens All questions/ concerns ar answered  Morbid obesity ([place])  Patient re-educated about  the importance of commitment to a  minimum of 150 minutes of exercise per week as able.  The importance of healthy food choices with portion control discussed, as well as eating regularly and within a 12 hour window most days. The need to choose "clean , green" food 50 to 75% of the time is discussed, as well as to make water the primary drink and set a goal of 64 ounces water daily.    Weight /BMI 08/19/2020 07/01/2020 04/26/2020  WEIGHT 246 lb 242 lb 0.6 oz 242 lb  HEIGHT 5\' 6"  5\' 6"  5\' 6"   BMI 39.71 kg/m2 39.07 kg/m2 39.06 kg/m2  Target 5 pound weight loss , f/u in 8 weeks    Dyslipidemia Hyperlipidemia:Low fat diet discussed and encouraged.   Lipid Panel  Lab Results  Component Value Date   CHOL 157 06/25/2020   HDL 38 (L) 06/25/2020   LDLCALC 88 06/25/2020   TRIG 183 (H) 06/25/2020   CHOLHDL 4.1 06/25/2020     Updated lab needed at/ before next visit.   GERD Controlled, no change in medication

## 2020-08-19 NOTE — Patient Instructions (Addendum)
Keep December visit, for re evaluation of weight  You are treated for Vertigo today, medication is prescribed  Blood pressure is normal  Please work on 5 pound target weight losss, by increasing exercise and decreasing fried, fatty and processed foods. Increase fruit and vegetables and water  Fasting lipid, chem 7 and eGFR 1 week before next visit  Thanks for choosing [facility], we consider it a privelige to serve you.  Vertigo Vertigo is the feeling that you or the things around you are moving when they are not. This feeling can come and go at any time. Vertigo often goes away on its own. This condition can be dangerous if it happens when you are doing activities like driving or working with machines. Your doctor will do tests to find the cause of your vertigo. These tests will also help your doctor decide on the best treatment for you. Follow these instructions at home: Eating and drinking      Drink enough fluid to keep your pee (urine) pale yellow.  Do not drink alcohol. Activity  Return to your normal activities as told by your doctor. Ask your doctor what activities are safe for you.  In the morning, first sit up on the side of the bed. When you feel okay, stand slowly while you hold onto something until you know that your balance is fine.  Move slowly. Avoid sudden body or head movements or certain positions, as told by your doctor.  Use a cane if you have trouble standing or walking.  Sit down right away if you feel dizzy.  Avoid doing any tasks or activities that can cause danger to you or others if you get dizzy.  Avoid bending down if you feel dizzy. Place items in your home so that they are easy for you to reach without leaning over.  Do not drive or use heavy machinery if you feel dizzy. General instructions  Take over-the-counter and prescription medicines only as told by your doctor.  Keep all follow-up visits as told by your doctor. This is  important. Contact a doctor if:  Your medicine does not help your vertigo.  You have a fever.  Your problems get worse or you have new symptoms.  Your family or friends see changes in your behavior.  The feeling of being sick to your stomach gets worse.  Your vomiting gets worse.  You lose feeling (have numbness) in part of your body.  You feel prickling and tingling in a part of your body. Get help right away if:  You have trouble moving or talking.  You are always dizzy.  You pass out (faint).  You get very bad headaches.  You feel weak in your hands, arms, or legs.  You have changes in your hearing.  You have changes in how you see (vision).  You get a stiff neck.  Bright light starts to bother you. Summary  Vertigo is the feeling that you or the things around you are moving when they are not.  Your doctor will do tests to find the cause of your vertigo.  You may be told to avoid some tasks, positions, or movements.  Contact a doctor if your medicine is not helping, or if you have a fever, new symptoms, or a change in behavior.  Get help right away if you get very bad headaches, or if you have changes in how you speak, hear, or see. This information is not intended to replace advice given to  you by your health care provider. Make sure you discuss any questions you have with your health care provider. Document Revised: 09/12/2018 Document Reviewed: 09/12/2018 Elsevier Patient Education  2020 [employer].

## 2020-08-25 ENCOUNTER — Encounter: Payer: Self-pay | Admitting: Family Medicine

## 2020-08-25 NOTE — Assessment & Plan Note (Signed)
  Patient re-educated about  the importance of commitment to a  minimum of 150 minutes of exercise per week as able.  The importance of healthy food choices with portion control discussed, as well as eating regularly and within a 12 hour window most days. The need to choose "clean , green" food 50 to 75% of the time is discussed, as well as to make water the primary drink and set a goal of 64 ounces water daily.    Weight /BMI 08/19/2020 07/01/2020 04/26/2020  WEIGHT 246 lb 242 lb 0.6 oz 242 lb  HEIGHT 5\' 6"  5\' 6"  5\' 6"   BMI 39.71 kg/m2 39.07 kg/m2 39.06 kg/m2  Target 5 pound weight loss , f/u in 8 weeks

## 2020-08-25 NOTE — Assessment & Plan Note (Signed)
First episode, duration approx 1 week, reducing spontaneously in severity. Educated re diagnosis, limited amount of meclizine prescribed, will refer to ENT if persists or worsens All questions/ concerns ar answered

## 2020-08-25 NOTE — Assessment & Plan Note (Signed)
Hyperlipidemia:Low fat diet discussed and encouraged.   Lipid Panel  Lab Results  Component Value Date   CHOL 157 06/25/2020   HDL 38 (L) 06/25/2020   LDLCALC 88 06/25/2020   TRIG 183 (H) 06/25/2020   CHOLHDL 4.1 06/25/2020     Updated lab needed at/ before next visit.

## 2020-08-25 NOTE — Assessment & Plan Note (Signed)
Controlled, no change in medication  

## 2020-10-07 ENCOUNTER — Ambulatory Visit: Payer: 59 | Admitting: Family Medicine

## 2020-11-28 ENCOUNTER — Encounter (INDEPENDENT_AMBULATORY_CARE_PROVIDER_SITE_OTHER): Payer: Self-pay | Admitting: *Deleted

## 2020-12-10 ENCOUNTER — Other Ambulatory Visit: Payer: Self-pay

## 2020-12-10 ENCOUNTER — Other Ambulatory Visit: Payer: 59

## 2020-12-10 DIAGNOSIS — E785 Hyperlipidemia, unspecified: Secondary | ICD-10-CM | POA: Diagnosis not present

## 2020-12-10 DIAGNOSIS — R972 Elevated prostate specific antigen [PSA]: Secondary | ICD-10-CM | POA: Diagnosis not present

## 2020-12-10 DIAGNOSIS — R42 Dizziness and giddiness: Secondary | ICD-10-CM | POA: Diagnosis not present

## 2020-12-11 ENCOUNTER — Telehealth: Payer: Self-pay

## 2020-12-11 LAB — BMP8+EGFR
BUN/Creatinine Ratio: 14 (ref 9–20)
BUN: 16 mg/dL (ref 6–24)
CO2: 21 mmol/L (ref 20–29)
Calcium: 9.4 mg/dL (ref 8.7–10.2)
Chloride: 104 mmol/L (ref 96–106)
Creatinine, Ser: 1.13 mg/dL (ref 0.76–1.27)
GFR calc Af Amer: 82 mL/min/{1.73_m2} (ref >59)
GFR calc non Af Amer: 71 mL/min/{1.73_m2} (ref >59)
Glucose: 112 mg/dL — ABNORMAL HIGH (ref 65–99)
Potassium: 4.4 mmol/L (ref 3.5–5.2)
Sodium: 142 mmol/L (ref 134–144)

## 2020-12-11 LAB — PSA: Prostate Specific Ag, Serum: 0.1 ng/mL (ref 0.0–4.0)

## 2020-12-11 LAB — LIPID PANEL
Chol/HDL Ratio: 4.2 ratio (ref 0.0–5.0)
Cholesterol, Total: 160 mg/dL (ref 100–199)
HDL: 38 mg/dL — ABNORMAL LOW (ref >39)
LDL Chol Calc (NIH): 98 mg/dL (ref 0–99)
Triglycerides: 137 mg/dL (ref 0–149)
VLDL Cholesterol Cal: 24 mg/dL (ref 5–40)

## 2020-12-11 NOTE — Telephone Encounter (Signed)
Pt called wanting PSA results. Pt was seen yesterday. Explained they were not back yet but when they came dr will look at we will call after that.

## 2020-12-20 ENCOUNTER — Other Ambulatory Visit: Payer: 59
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
## 2020-12-26 ENCOUNTER — Other Ambulatory Visit: Payer: Self-pay

## 2020-12-26 ENCOUNTER — Encounter: Payer: Self-pay | Admitting: Urology

## 2020-12-26 ENCOUNTER — Ambulatory Visit (INDEPENDENT_AMBULATORY_CARE_PROVIDER_SITE_OTHER): Payer: 59 | Admitting: Urology

## 2020-12-26 VITALS — BP 137/87 | HR 121 | Temp 98.2°F | Ht 66.0 in | Wt 244.0 lb

## 2020-12-26 DIAGNOSIS — R351 Nocturia: Secondary | ICD-10-CM

## 2020-12-26 DIAGNOSIS — R972 Elevated prostate specific antigen [PSA]: Secondary | ICD-10-CM

## 2020-12-26 DIAGNOSIS — Z8546 Personal history of malignant neoplasm of prostate: Secondary | ICD-10-CM | POA: Diagnosis not present

## 2020-12-26 DIAGNOSIS — C61 Malignant neoplasm of prostate: Secondary | ICD-10-CM | POA: Diagnosis not present

## 2020-12-26 LAB — URINALYSIS, ROUTINE W REFLEX MICROSCOPIC
Bilirubin, UA: NEGATIVE
Glucose, UA: NEGATIVE
Leukocytes,UA: NEGATIVE
Nitrite, UA: NEGATIVE
Specific Gravity, UA: 1.02 (ref 1.005–1.030)
Urobilinogen, Ur: 1 mg/dL (ref 0.2–1.0)
pH, UA: 7 (ref 5.0–7.5)

## 2020-12-26 LAB — MICROSCOPIC EXAMINATION
Bacteria, UA: NONE SEEN
Renal Epithel, UA: NONE SEEN /hpf
WBC, UA: NONE SEEN /hpf (ref 0–5)

## 2020-12-26 NOTE — Progress Notes (Signed)

## 2020-12-27 ENCOUNTER — Ambulatory Visit: Payer: 59 | Admitting: Urology

## 2020-12-30 ENCOUNTER — Ambulatory Visit (INDEPENDENT_AMBULATORY_CARE_PROVIDER_SITE_OTHER): Payer: 59 | Admitting: Internal Medicine

## 2020-12-30 ENCOUNTER — Encounter: Payer: Self-pay | Admitting: Internal Medicine

## 2020-12-30 ENCOUNTER — Other Ambulatory Visit: Payer: Self-pay

## 2020-12-30 ENCOUNTER — Ambulatory Visit: Payer: 59 | Admitting: Family Medicine

## 2020-12-30 VITALS — BP 139/85 | HR 96 | Temp 98.7°F | Resp 18 | Ht 66.0 in | Wt 245.0 lb

## 2020-12-30 DIAGNOSIS — F5101 Primary insomnia: Secondary | ICD-10-CM | POA: Diagnosis not present

## 2020-12-30 DIAGNOSIS — K219 Gastro-esophageal reflux disease without esophagitis: Secondary | ICD-10-CM | POA: Diagnosis not present

## 2020-12-30 DIAGNOSIS — I10 Essential (primary) hypertension: Secondary | ICD-10-CM | POA: Insufficient documentation

## 2020-12-30 DIAGNOSIS — R030 Elevated blood-pressure reading, without diagnosis of hypertension: Secondary | ICD-10-CM | POA: Diagnosis not present

## 2020-12-30 DIAGNOSIS — G609 Hereditary and idiopathic neuropathy, unspecified: Secondary | ICD-10-CM | POA: Diagnosis not present

## 2020-12-30 MED ORDER — GABAPENTIN 100 MG PO CAPS: 100.0000 mg | ORAL_CAPSULE | Freq: Every day | ORAL | 3 refills | Status: DC

## 2020-12-30 MED ORDER — TEMAZEPAM 15 MG PO CAPS: 15.0000 mg | ORAL_CAPSULE | Freq: Every evening | ORAL | 0 refills | Status: DC | PRN

## 2020-12-30 NOTE — Assessment & Plan Note (Signed)
On Pantoprazole 

## 2020-12-30 NOTE — Assessment & Plan Note (Signed)
BP Readings from Last 1 Encounters:  12/30/20 139/85   Stable Advised DASH diet and moderate exercise/walking, at least 150 mins/week

## 2020-12-30 NOTE — Assessment & Plan Note (Signed)
Has difficulty maintaining sleep Restarted Restoril, PDMP reviewed

## 2020-12-30 NOTE — Patient Instructions (Addendum)
Please start taking Gabapentin for burning pain.  Please avoid prolonged standing. Try to use shoes with softer and wider base.  Please maintain simple sleep hygiene. - Maintain dark and non-noisy environment in the bedroom. - Please use the bedroom for sleep and sexual activity only. - Do not use electronic devices in the bedroom. - Please take dinner at least 2 hours before bedtime. - Please avoid caffeinated products in the evening, including coffee, soft drinks. - Please try to maintain the regular sleep-wake cycle - Go to bed and wake up at the same time.  Please follow DASH diet and perform moderate exercise/walking at least 150 mins/week. [email or web address]">  DASH Eating Plan DASH stands for Dietary Approaches to Stop Hypertension. The DASH eating plan is a healthy eating plan that has been shown to:  Reduce high blood pressure (hypertension).  Reduce your risk for type 2 diabetes, heart disease, and stroke.  Help with weight loss. What are tips for following this plan? Reading food labels  Check food labels for the amount of salt (sodium) per serving. Choose foods with less than 5 percent of the Daily Value of sodium. Generally, foods with less than 300 milligrams (mg) of sodium per serving fit into this eating plan.  To find whole grains, look for the word "whole" as the first word in the ingredient list. Shopping  Buy products labeled as "low-sodium" or "no salt added."  Buy fresh foods. Avoid canned foods and pre-made or frozen meals. Cooking  Avoid adding salt when cooking. Use salt-free seasonings or herbs instead of table salt or sea salt. Check with your health care provider or pharmacist before using salt substitutes.  Do not fry foods. Cook foods using healthy methods such as baking, boiling, grilling, roasting, and broiling instead.  Cook with heart-healthy oils, such as olive, canola, avocado, soybean, or  sunflower oil. Meal planning  Eat a balanced diet that includes: ? 4 or more servings of fruits and 4 or more servings of vegetables each day. Try to fill one-half of your plate with fruits and vegetables. ? 6-8 servings of whole grains each day. ? Less than 6 oz (170 g) of lean meat, poultry, or fish each day. A 3-oz (85-g) serving of meat is about the same size as a deck of cards. One egg equals 1 oz (28 g). ? 2-3 servings of low-fat dairy each day. One serving is 1 cup (237 mL). ? 1 serving of nuts, seeds, or beans 5 times each week. ? 2-3 servings of heart-healthy fats. Healthy fats called omega-3 fatty acids are found in foods such as walnuts, flaxseeds, fortified milks, and eggs. These fats are also found in cold-water fish, such as sardines, salmon, and mackerel.  Limit how much you eat of: ? Canned or prepackaged foods. ? Food that is high in trans fat, such as some fried foods. ? Food that is high in saturated fat, such as fatty meat. ? Desserts and other sweets, sugary drinks, and other foods with added sugar. ? Full-fat dairy products.  Do not salt foods before eating.  Do not eat more than 4 egg yolks a week.  Try to eat at least 2 vegetarian meals a week.  Eat more home-cooked food and less restaurant, buffet, and fast food.   Lifestyle  When eating at a restaurant, ask that your food be prepared with less salt or no salt, if possible.  If you drink alcohol: ? Limit how much you use to:  0-1 drink a  day for women who are not pregnant.  0-2 drinks a day for men. ? Be aware of how much alcohol is in your drink. In the [place] one drink equals one 12 oz bottle of beer (355 mL), one 5 oz glass of wine (148 mL), or one 1 oz glass of hard liquor (44 mL). General information  Avoid eating more than 2,300 mg of salt a day. If you have hypertension, you may need to reduce your sodium intake to 1,500 mg a day.  Work with your health care provider to maintain a healthy body  weight or to lose weight. Ask what an ideal weight is for you.  Get at least 30 minutes of exercise that causes your heart to beat faster (aerobic exercise) most days of the week. Activities may include walking, swimming, or biking.  Work with your health care provider or dietitian to adjust your eating plan to your individual calorie needs. What foods should I eat? Fruits All fresh, dried, or frozen fruit. Canned fruit in natural juice (without added sugar). Vegetables Fresh or frozen vegetables (raw, steamed, roasted, or grilled). Low-sodium or reduced-sodium tomato and vegetable juice. Low-sodium or reduced-sodium tomato sauce and tomato paste. Low-sodium or reduced-sodium canned vegetables. Grains Whole-grain or whole-wheat bread. Whole-grain or whole-wheat pasta. Brown rice. [place]. Bulgur. Whole-grain and low-sodium cereals. Pita bread. Low-fat, low-sodium crackers. Whole-wheat flour tortillas. Meats and other proteins Skinless chicken or [place]. Ground chicken or [place]. Pork with fat trimmed off. Fish and seafood. Egg whites. Dried beans, peas, or lentils. Unsalted nuts, nut butters, and seeds. Unsalted canned beans. Lean cuts of beef with fat trimmed off. Low-sodium, lean precooked or cured meat, such as sausages or meat loaves. Dairy Low-fat (1%) or fat-free (skim) milk. Reduced-fat, low-fat, or fat-free cheeses. Nonfat, low-sodium ricotta or cottage cheese. Low-fat or nonfat yogurt. Low-fat, low-sodium cheese. Fats and oils Soft margarine without trans fats. Vegetable oil. Reduced-fat, low-fat, or light mayonnaise and salad dressings (reduced-sodium). Canola, safflower, olive, avocado, soybean, and sunflower oils. Avocado. Seasonings and condiments Herbs. Spices. Seasoning mixes without salt. Other foods Unsalted popcorn and pretzels. Fat-free sweets. The items listed above may not be a complete list of foods and beverages you can eat. Contact a dietitian for more  information. What foods should I avoid? Fruits Canned fruit in a light or heavy syrup. Fried fruit. Fruit in cream or butter sauce. Vegetables Creamed or fried vegetables. Vegetables in a cheese sauce. Regular canned vegetables (not low-sodium or reduced-sodium). Regular canned tomato sauce and paste (not low-sodium or reduced-sodium). Regular tomato and vegetable juice (not low-sodium or reduced-sodium). Angie Fava. Olives. Grains Baked goods made with fat, such as croissants, muffins, or some breads. Dry pasta or rice meal packs. Meats and other proteins Fatty cuts of meat. Ribs. Fried meat. Berniece Salines. Bologna, salami, and other precooked or cured meats, such as sausages or meat loaves. Fat from the back of a pig (fatback). Bratwurst. Salted nuts and seeds. Canned beans with added salt. Canned or smoked fish. Whole eggs or egg yolks. Chicken or [place] with skin. Dairy Whole or 2% milk, cream, and half-and-half. Whole or full-fat cream cheese. Whole-fat or sweetened yogurt. Full-fat cheese. Nondairy creamers. Whipped toppings. Processed cheese and cheese spreads. Fats and oils Butter. Stick margarine. Lard. Shortening. Ghee. Bacon fat. Tropical oils, such as coconut, palm kernel, or palm oil. Seasonings and condiments Onion salt, garlic salt, seasoned salt, table salt, and sea salt. Worcestershire sauce. Tartar sauce. Barbecue sauce. Teriyaki sauce. Soy sauce, including reduced-sodium. Steak  sauce. Canned and packaged gravies. Fish sauce. Oyster sauce. Cocktail sauce. Store-bought horseradish. Ketchup. Mustard. Meat flavorings and tenderizers. Bouillon cubes. Hot sauces. Pre-made or packaged marinades. Pre-made or packaged taco seasonings. Relishes. Regular salad dressings. Other foods Salted popcorn and pretzels. The items listed above may not be a complete list of foods and beverages you should avoid. Contact a dietitian for more information. Where to find more information  National Heart, Lung, and  Blood Institute: https://wilson-eaton.com/  American Heart Association: www.heart.org  Academy of Nutrition and Dietetics: www.eatright.[place]: www.kidney.org Summary  The DASH eating plan is a healthy eating plan that has been shown to reduce high blood pressure (hypertension). It may also reduce your risk for type 2 diabetes, heart disease, and stroke.  When on the DASH eating plan, aim to eat more fresh fruits and vegetables, whole grains, lean proteins, low-fat dairy, and heart-healthy fats.  With the DASH eating plan, you should limit salt (sodium) intake to 2,300 mg a day. If you have hypertension, you may need to reduce your sodium intake to 1,500 mg a day.  Work with your health care provider or dietitian to adjust your eating plan to your individual calorie needs. This information is not intended to replace advice given to you by your health care provider. Make sure you discuss any questions you have with your health care provider. Document Revised: 09/22/2019 Document Reviewed: 09/22/2019 Elsevier Patient Education  2021 [employer].

## 2020-12-30 NOTE — Assessment & Plan Note (Signed)
Has right leg pain, burning in nature No point tenderness, does not appear to MSK related pain Trial of Gabapentin If persistent pain, can get Podiatry evaluation
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
## 2021-01-27 ENCOUNTER — Encounter (INDEPENDENT_AMBULATORY_CARE_PROVIDER_SITE_OTHER): Payer: Self-pay | Admitting: *Deleted

## 2021-01-27 ENCOUNTER — Telehealth (INDEPENDENT_AMBULATORY_CARE_PROVIDER_SITE_OTHER): Payer: Self-pay | Admitting: *Deleted

## 2021-01-27 NOTE — Telephone Encounter (Signed)
Patient needs suprep 

## 2021-01-28 ENCOUNTER — Other Ambulatory Visit (INDEPENDENT_AMBULATORY_CARE_PROVIDER_SITE_OTHER): Payer: Self-pay

## 2021-01-28 DIAGNOSIS — Z86010 Personal history of colonic polyps: Secondary | ICD-10-CM

## 2021-01-28 MED ORDER — SUPREP BOWEL PREP KIT 17.5-3.13-1.6 GM/177ML PO SOLN: 1.0000 | Freq: Once | ORAL | 0 refills | Status: AC

## 2021-01-28 NOTE — Telephone Encounter (Signed)
Done

## 2021-01-29 ENCOUNTER — Other Ambulatory Visit (INDEPENDENT_AMBULATORY_CARE_PROVIDER_SITE_OTHER): Payer: Self-pay | Admitting: *Deleted

## 2021-02-25 DIAGNOSIS — H25013 Cortical age-related cataract, bilateral: Secondary | ICD-10-CM | POA: Diagnosis not present

## 2021-02-25 DIAGNOSIS — H2513 Age-related nuclear cataract, bilateral: Secondary | ICD-10-CM | POA: Diagnosis not present

## 2021-02-25 DIAGNOSIS — H5213 Myopia, bilateral: Secondary | ICD-10-CM | POA: Diagnosis not present

## 2021-02-25 DIAGNOSIS — H524 Presbyopia: Secondary | ICD-10-CM | POA: Diagnosis not present

## 2021-02-25 DIAGNOSIS — H52203 Unspecified astigmatism, bilateral: Secondary | ICD-10-CM | POA: Diagnosis not present

## 2021-02-28 ENCOUNTER — Telehealth (INDEPENDENT_AMBULATORY_CARE_PROVIDER_SITE_OTHER): Payer: Self-pay | Admitting: *Deleted

## 2021-02-28 NOTE — Telephone Encounter (Signed)
Referring MD/PCP: simpson  Procedure: tcs  Reason/Indication:  Hx polyps  Has patient had this procedure before?  Yes, 12/2017  If so, when, by whom and where?    Is there a family history of colon cancer?  no  Who?  What age when diagnosed?    Is patient diabetic? If yes, Type 1 or Type 2   no      Does patient have prosthetic heart valve or mechanical valve?  no  Do you have a pacemaker/defibrillator?  no  Has patient ever had endocarditis/atrial fibrillation? no  Have you had a stroke/heart attack last 6 mths? no  Does patient use oxygen? no  Has patient had joint replacement within last 12 months?  no  Is patient constipated or do they take laxatives? no  Does patient have a history of alcohol/drug use?  no  Is patient on blood thinner such as Coumadin, Plavix and/or Aspirin? no  Do you take medicine for weight loss?  no  For male patients,: do you still have your menstrual cycle? n/a  Medications: gabapentin 100 mg daily, mvi daily, pantoprazole 40 mg daily, temazepam 15 mg daily  Allergies: nkda  Medication Adjustment per Dr Rehman/Dr Jenetta Downer   Procedure date & time: 03/27/21

## 2021-03-25 ENCOUNTER — Other Ambulatory Visit: Payer: Self-pay

## 2021-03-25 ENCOUNTER — Other Ambulatory Visit (HOSPITAL_COMMUNITY)
Admission: RE | Admit: 2021-03-25 | Discharge: 2021-03-25 | Disposition: A | Discharge status: Home / Self Care | Payer: 59 | Source: Ambulatory Visit | Attending: Internal Medicine | Admitting: Internal Medicine

## 2021-03-25 DIAGNOSIS — Z20822 Contact with and (suspected) exposure to covid-19: Secondary | ICD-10-CM | POA: Insufficient documentation

## 2021-03-25 DIAGNOSIS — Z01812 Encounter for preprocedural laboratory examination: Secondary | ICD-10-CM | POA: Diagnosis not present

## 2021-03-25 LAB — SARS CORONAVIRUS 2 (TAT 6-24 HRS): SARS Coronavirus 2: NEGATIVE

## 2021-03-27 ENCOUNTER — Other Ambulatory Visit: Payer: Self-pay

## 2021-03-27 ENCOUNTER — Encounter (HOSPITAL_COMMUNITY): Payer: Self-pay | Admitting: Internal Medicine

## 2021-03-27 ENCOUNTER — Encounter (HOSPITAL_COMMUNITY)
Admission: RE | Disposition: A | Discharge status: Home / Self Care | Payer: Self-pay | Source: Home / Self Care | Attending: Internal Medicine

## 2021-03-27 ENCOUNTER — Ambulatory Visit (HOSPITAL_COMMUNITY)
Admission: RE | Admit: 2021-03-27 | Discharge: 2021-03-27 | Disposition: A | Discharge status: Home / Self Care | Payer: 59 | Attending: Internal Medicine | Admitting: Internal Medicine

## 2021-03-27 DIAGNOSIS — Z09 Encounter for follow-up examination after completed treatment for conditions other than malignant neoplasm: Secondary | ICD-10-CM | POA: Diagnosis present

## 2021-03-27 DIAGNOSIS — Z86010 Personal history of colonic polyps: Secondary | ICD-10-CM | POA: Diagnosis not present

## 2021-03-27 DIAGNOSIS — K5730 Diverticulosis of large intestine without perforation or abscess without bleeding: Secondary | ICD-10-CM | POA: Insufficient documentation

## 2021-03-27 DIAGNOSIS — Z8546 Personal history of malignant neoplasm of prostate: Secondary | ICD-10-CM | POA: Insufficient documentation

## 2021-03-27 DIAGNOSIS — D125 Benign neoplasm of sigmoid colon: Secondary | ICD-10-CM | POA: Diagnosis not present

## 2021-03-27 DIAGNOSIS — K648 Other hemorrhoids: Secondary | ICD-10-CM | POA: Insufficient documentation

## 2021-03-27 DIAGNOSIS — K635 Polyp of colon: Secondary | ICD-10-CM | POA: Insufficient documentation

## 2021-03-27 DIAGNOSIS — Z79899 Other long term (current) drug therapy: Secondary | ICD-10-CM | POA: Diagnosis not present

## 2021-03-27 DIAGNOSIS — D123 Benign neoplasm of transverse colon: Secondary | ICD-10-CM | POA: Diagnosis not present

## 2021-03-27 DIAGNOSIS — Z1211 Encounter for screening for malignant neoplasm of colon: Secondary | ICD-10-CM | POA: Diagnosis not present

## 2021-03-27 HISTORY — PX: COLONOSCOPY: SHX5424

## 2021-03-27 HISTORY — PX: POLYPECTOMY: SHX5525

## 2021-03-27 LAB — HM COLONOSCOPY

## 2021-03-27 SURGERY — COLONOSCOPY
Anesthesia: Moderate Sedation

## 2021-03-27 MED ORDER — STERILE WATER FOR IRRIGATION IR SOLN
Status: DC | PRN
  Administered 2021-03-27: 1.5 mL

## 2021-03-27 MED ORDER — MIDAZOLAM HCL 5 MG/5ML IJ SOLN
INTRAMUSCULAR | Status: AC
  Filled 2021-03-27: qty 10

## 2021-03-27 MED ORDER — SODIUM CHLORIDE 0.9 % IV SOLN
INTRAVENOUS | Status: DC
Start: 2021-03-27 — End: 2021-03-27

## 2021-03-27 MED ORDER — MEPERIDINE HCL 50 MG/ML IJ SOLN
INTRAMUSCULAR | Status: DC | PRN
  Administered 2021-03-27 (×2): 25 mg via INTRAVENOUS

## 2021-03-27 MED ORDER — MIDAZOLAM HCL 5 MG/5ML IJ SOLN
INTRAMUSCULAR | Status: DC | PRN
  Administered 2021-03-27: 1 mg via INTRAVENOUS
  Administered 2021-03-27: 2 mg via INTRAVENOUS
  Administered 2021-03-27: 1 mg via INTRAVENOUS
  Administered 2021-03-27: 2 mg via INTRAVENOUS

## 2021-03-27 MED ORDER — MEPERIDINE HCL 50 MG/ML IJ SOLN
INTRAMUSCULAR | Status: AC
  Filled 2021-03-27: qty 1

## 2021-03-27 NOTE — Op Note (Signed)
[facility] Patient Name: Anthony Erickson Procedure Date: 03/27/2021 8:23 AM MRN: [id number] Date of Birth: 01-30-1961 Attending MD: Hildred Laser , MD CSN: [id number] Age: 60 Admit Type: Outpatient Procedure:                Colonoscopy Indications:              High risk colon cancer surveillance: Personal                            history of colonic polyps Providers:                Hildred Laser, MD, Otis Peak B. Sharon Seller, RN, Aram Candela Referring MD:             Tula Nakayama, MD Medicines:                Meperidine 50 mg IV, Midazolam 6 mg IV Complications:            No immediate complications. Estimated Blood Loss:     Estimated blood loss was minimal. Procedure:                Pre-Anesthesia Assessment:                           - Prior to the procedure, a History and Physical                            was performed, and patient medications and                            allergies were reviewed. The patient's tolerance of                            previous anesthesia was also reviewed. The risks                            and benefits of the procedure and the sedation                            options and risks were discussed with the patient.                            All questions were answered, and informed consent                            was obtained. Prior Anticoagulants: The patient has                            taken no previous anticoagulant or antiplatelet                            agents. ASA Grade Assessment: II - A patient with  mild systemic disease. After reviewing the risks                            and benefits, the patient was deemed in                            satisfactory condition to undergo the procedure.                           After obtaining informed consent, the colonoscope                            was passed under direct vision. Throughout the                             procedure, the patient's blood pressure, pulse, and                            oxygen saturations were monitored continuously. The                            [id number]-H190DL ([id number]) scope was introduced through                            the anus and advanced to the the cecum, identified                            by appendiceal orifice and ileocecal valve. The                            colonoscopy was performed without difficulty. The                            patient tolerated the procedure well. The quality                            of the bowel preparation was excellent. The                            ileocecal valve, appendiceal orifice, and rectum                            were photographed. Scope In: 8:53:01 AM Scope Out: 9:20:02 AM Scope Withdrawal Time: 0 hours 17 minutes 23 seconds  Total Procedure Duration: 0 hours 27 minutes 1 second  Findings:      The perianal and digital rectal examinations were normal.      Six polyps were found in the distal sigmoid colon and splenic flexure.       The polyps were diminutive in size. These were biopsied with a cold       forceps for histology. The pathology specimen was placed into Bottle       Number 1.      Scattered diverticula were found in the sigmoid colon.      Internal hemorrhoids were found during retroflexion. The  hemorrhoids       were small. Impression:               - Six diminutive polyps in the distal sigmoid colon                            and at the splenic flexure. Biopsied.                           - Diverticulosis in the sigmoid colon.                           - Internal hemorrhoids. Moderate Sedation:      Moderate (conscious) sedation was administered by the endoscopy nurse       and supervised by the endoscopist. The following parameters were       monitored: oxygen saturation, heart rate, blood pressure, CO2       capnography and response to care. Total physician intraservice time was       32  minutes. Recommendation:           - Patient has a contact number available for                            emergencies. The signs and symptoms of potential                            delayed complications were discussed with the                            patient. Return to normal activities tomorrow.                            Written discharge instructions were provided to the                            patient.                           - High fiber diet today.                           - Continue present medications.                           - No aspirin, ibuprofen, naproxen, or other                            non-steroidal anti-inflammatory drugs for 1 day.                           - Await pathology results.                           - Repeat colonoscopy in 5 years for surveillance. Procedure Code(s):        --- Professional ---  35329, Colonoscopy, flexible; with biopsy, single                            or multiple                           99153, Moderate sedation; each additional 15                            minutes intraservice time                           G0500, Moderate sedation services provided by the                            same physician or other qualified health care                            professional performing a gastrointestinal                            endoscopic service that sedation supports,                            requiring the presence of an independent trained                            observer to assist in the monitoring of the                            patient's level of consciousness and physiological                            status; initial 15 minutes of intra-service time;                            patient age 76 years or older (additional time may                            be reported with [phone], as appropriate) Diagnosis Code(s):        --- Professional ---                           Z86.010, Personal  history of colonic polyps                           K63.5, Polyp of colon                           K64.8, Other hemorrhoids                           K57.30, Diverticulosis of large intestine without                            perforation or abscess without bleeding  CPT copyright 2019 [employer]. All rights reserved. The codes documented in this report are preliminary and upon coder review may  be revised to meet current compliance requirements. Hildred Laser, MD Hildred Laser, MD 03/27/2021 9:36:10 AM This report has been signed electronically. Number of Addenda: 0

## 2021-03-27 NOTE — Discharge Instructions (Signed)
No aspirin or NSAIDs for 24 hours. Resume scheduled medications as before. High-fiber diet. No driving for 24 hours. Physician will call with biopsy results.    Colonoscopy, Adult, Care After This sheet gives you information about how to care for yourself after your procedure. Your doctor may also give you more specific instructions. If you have problems or questions, call your doctor. What can I expect after the procedure? After the procedure, it is common to have:  A small amount of blood in your poop (stool) for 24 hours.  Some gas.  Mild cramping or bloating in your belly (abdomen). Follow these instructions at home: Eating and drinking  Drink enough fluid to keep your pee (urine) pale yellow.  Follow instructions from your doctor about what you cannot eat or drink.  Return to your normal diet as told by your doctor. Avoid heavy or fried foods that are hard to digest.   Activity  Rest as told by your doctor.  Do not sit for a long time without moving. Get up to take short walks every 1-2 hours. This is important. Ask for help if you feel weak or unsteady.  Return to your normal activities as told by your doctor. Ask your doctor what activities are safe for you. To help cramping and bloating:  Try walking around.  Put heat on your belly as told by your doctor. Use the heat source that your doctor recommends, such as a moist heat pack or a heating pad. ? Put a towel between your skin and the heat source. ? Leave the heat on for 20-30 minutes. ? Remove the heat if your skin turns bright red. This is very important if you are unable to feel pain, heat, or cold. You may have a greater risk of getting burned.   General instructions  If you were given a medicine to help you relax (sedative) during your procedure, it can affect you for many hours. Do not drive or use machinery until your doctor says that it is safe.  For the first 24 hours after the procedure: ? Do not sign  important documents. ? Do not drink alcohol. ? Do your daily activities more slowly than normal. ? Eat foods that are soft and easy to digest.  Take over-the-counter or prescription medicines only as told by your doctor.  Keep all follow-up visits as told by your doctor. This is important. Contact a doctor if:  You have blood in your poop 2-3 days after the procedure. Get help right away if:  You have more than a small amount of blood in your poop.  You see large clumps of tissue (blood clots) in your poop.  Your belly is swollen.  You feel like you may vomit (nauseous).  You vomit.  You have a fever.  You have belly pain that gets worse, and medicine does not help your pain. Summary  After the procedure, it is common to have a small amount of blood in your poop. You may also have mild cramping and bloating in your belly.  If you were given a medicine to help you relax (sedative) during your procedure, it can affect you for many hours. Do not drive or use machinery until your doctor says that it is safe.  Get help right away if you have a lot of blood in your poop, feel like you may vomit, have a fever, or have more belly pain. This information is not intended to replace advice given to you by  your health care provider. Make sure you discuss any questions you have with your health care provider. Document Revised: 08/25/2019 Document Reviewed: 05/15/2019 Elsevier Patient Education  [place].   High-Fiber Eating Plan Fiber, also called dietary fiber, is a type of carbohydrate. It is found foods such as fruits, vegetables, whole grains, and beans. A high-fiber diet can have many health benefits. Your health care provider may recommend a high-fiber diet to help:  Prevent constipation. Fiber can make your bowel movements more regular.  Lower your cholesterol.  Relieve the following conditions: ? Inflammation of veins in the anus (hemorrhoids). ? Inflammation of  specific areas of the digestive tract (uncomplicated diverticulosis). ? A problem of the large intestine, also called the colon, that sometimes causes pain and diarrhea (irritable bowel syndrome, or IBS).  Prevent overeating as part of a weight-loss plan.  Prevent heart disease, type 2 diabetes, and certain cancers. What are tips for following this plan? Reading food labels  Check the nutrition facts label on food products for the amount of dietary fiber. Choose foods that have 5 grams of fiber or more per serving.  The goals for recommended daily fiber intake include: ? Men (age 5 or younger): 34-38 g. ? Men (over age 36): 28-34 g. ? Women (age 68 or younger): 25-28 g. ? Women (over age 30): 22-25 g. Your daily fiber goal is _____________ g.   Shopping  Choose whole fruits and vegetables instead of processed forms, such as apple juice or applesauce.  Choose a wide variety of high-fiber foods such as avocados, lentils, oats, and kidney beans.  Read the nutrition facts label of the foods you choose. Be aware of foods with added fiber. These foods often have high sugar and sodium amounts per serving. Cooking  Use whole-grain flour for baking and cooking.  Cook with brown rice instead of white rice. Meal planning  Start the day with a breakfast that is high in fiber, such as a cereal that contains 5 g of fiber or more per serving.  Eat breads and cereals that are made with whole-grain flour instead of refined flour or white flour.  Eat brown rice, bulgur wheat, or millet instead of white rice.  Use beans in place of meat in soups, salads, and pasta dishes.  Be sure that half of the grains you eat each day are whole grains. General information  You can get the recommended daily intake of dietary fiber by: ? Eating a variety of fruits, vegetables, grains, nuts, and beans. ? Taking a fiber supplement if you are not able to take in enough fiber in your diet. It is better to get  fiber through food than from a supplement.  Gradually increase how much fiber you consume. If you increase your intake of dietary fiber too quickly, you may have bloating, cramping, or gas.  Drink plenty of water to help you digest fiber.  Choose high-fiber snacks, such as berries, raw vegetables, nuts, and popcorn. What foods should I eat? Fruits Berries. Pears. Apples. Oranges. Avocado. Prunes and raisins. Dried figs. Vegetables Sweet potatoes. Spinach. Kale. Artichokes. Cabbage. Broccoli. Cauliflower. Green peas. Carrots. Squash. Grains Whole-grain breads. Multigrain cereal. Oats and oatmeal. Brown rice. Barley. Bulgur wheat. [place]. Quinoa. Bran muffins. Popcorn. Rye wafer crackers. Meats and other proteins Navy beans, kidney beans, and pinto beans. Soybeans. Split peas. Lentils. Nuts and seeds. Dairy Fiber-fortified yogurt. Beverages Fiber-fortified soy milk. Fiber-fortified orange juice. Other foods Fiber bars. The items listed above may not be  a complete list of recommended foods and beverages. Contact a dietitian for more information. What foods should I avoid? Fruits Fruit juice. Cooked, strained fruit. Vegetables Fried potatoes. Canned vegetables. Well-cooked vegetables. Grains White bread. Pasta made with refined flour. White rice. Meats and other proteins Fatty cuts of meat. Fried chicken or fried fish. Dairy Milk. Yogurt. Cream cheese. Sour cream. Fats and oils Butters. Beverages Soft drinks. Other foods Cakes and pastries. The items listed above may not be a complete list of foods and beverages to avoid. Talk with your dietitian about what choices are best for you. Summary  Fiber is a type of carbohydrate. It is found in foods such as fruits, vegetables, whole grains, and beans.  A high-fiber diet has many benefits. It can help to prevent constipation, lower blood cholesterol, aid weight loss, and reduce your risk of heart disease, diabetes, and certain  cancers.  Increase your intake of fiber gradually. Increasing fiber too quickly may cause cramping, bloating, and gas. Drink plenty of water while you increase the amount of fiber you consume.  The best sources of fiber include whole fruits and vegetables, whole grains, nuts, seeds, and beans. This information is not intended to replace advice given to you by your health care provider. Make sure you discuss any questions you have with your health care provider. Document Revised: 02/22/2020 Document Reviewed: 02/22/2020 Elsevier Patient Education  2021 [place].   Colon Polyps  Colon polyps are tissue growths inside the colon, which is part of the large intestine. They are one of the types of polyps that can grow in the body. A polyp may be a round bump or a mushroom-shaped growth. You could have one polyp or more than one. Most colon polyps are noncancerous (benign). However, some colon polyps can become cancerous over time. Finding and removing the polyps early can help prevent this. What are the causes? The exact cause of colon polyps is not known. What increases the risk? The following factors may make you more likely to develop this condition:  Having a family history of colorectal cancer or colon polyps.  Being older than 59 years of age.  Being younger than 59 years of age and having a significant family history of colorectal cancer or colon polyps or a genetic condition that puts you at higher risk of getting colon polyps.  Having inflammatory bowel disease, such as ulcerative colitis or Crohn's disease.  Having certain conditions passed from parent to child (hereditary conditions), such as: ? Familial adenomatous polyposis (FAP). ? Lynch syndrome. ? Turcot syndrome. ? Peutz-Jeghers syndrome. ? MUTYH-associated polyposis (MAP).  Being overweight.  Certain lifestyle factors. These include smoking cigarettes, drinking too much alcohol, not getting enough exercise, and  eating a diet that is high in fat and red meat and low in fiber.  Having had childhood cancer that was treated with radiation of the abdomen. What are the signs or symptoms? Many times, there are no symptoms. If you have symptoms, they may include:  Blood coming from the rectum during a bowel movement.  Blood in the stool (feces). The blood may be bright red or very dark in color.  Pain in the abdomen.  A change in bowel habits, such as constipation or diarrhea. How is this diagnosed? This condition is diagnosed with a colonoscopy. This is a procedure in which a lighted, flexible scope is inserted into the opening between the buttocks (anus) and then passed into the colon to examine the area. Polyps are sometimes  found when a colonoscopy is done as part of routine cancer screening tests. How is this treated? This condition is treated by removing any polyps that are found. Most polyps can be removed during a colonoscopy. Those polyps will then be tested for cancer. Additional treatment may be needed depending on the results of testing. Follow these instructions at home: Eating and drinking  Eat foods that are high in fiber, such as fruits, vegetables, and whole grains.  Eat foods that are high in calcium and vitamin D, such as milk, cheese, yogurt, eggs, liver, fish, and broccoli.  Limit foods that are high in fat, such as fried foods and desserts.  Limit the amount of red meat, precooked or cured meat, or other processed meat that you eat, such as hot dogs, sausages, bacon, or meat loaves.  Limit sugary drinks.   Lifestyle  Maintain a healthy weight, or lose weight if recommended by your health care provider.  Exercise every day or as told by your health care provider.  Do not use any products that contain nicotine or tobacco, such as cigarettes, e-cigarettes, and chewing tobacco. If you need help quitting, ask your health care provider.  Do not drink alcohol if: ? Your health  care provider tells you not to drink. ? You are pregnant, may be pregnant, or are planning to become pregnant.  If you drink alcohol: ? Limit how much you use to:  0-1 drink a day for women.  0-2 drinks a day for men. ? Know how much alcohol is in your drink. In the [place] one drink equals one 12 oz bottle of beer (355 mL), one 5 oz glass of wine (148 mL), or one 1 oz glass of hard liquor (44 mL). General instructions  Take over-the-counter and prescription medicines only as told by your health care provider.  Keep all follow-up visits. This is important. This includes having regularly scheduled colonoscopies. Talk to your health care provider about when you need a colonoscopy. Contact a health care provider if:  You have new or worsening bleeding during a bowel movement.  You have new or increased blood in your stool.  You have a change in bowel habits.  You lose weight for no known reason. Summary  Colon polyps are tissue growths inside the colon, which is part of the large intestine. They are one type of polyp that can grow in the body.  Most colon polyps are noncancerous (benign), but some can become cancerous over time.  This condition is diagnosed with a colonoscopy.  This condition is treated by removing any polyps that are found. Most polyps can be removed during a colonoscopy. This information is not intended to replace advice given to you by your health care provider. Make sure you discuss any questions you have with your health care provider. Document Revised: 02/07/2020 Document Reviewed: 02/07/2020 Elsevier Patient Education  2021 [place].    Diverticulosis  Diverticulosis is a condition that develops when small pouches (diverticula) form in the wall of the large intestine (colon). The colon is where water is absorbed and stool (feces) is formed. The pouches form when the inside layer of the colon pushes through weak spots in the outer layers of the colon.  You may have a few pouches or many of them. The pouches usually do not cause problems unless they become inflamed or infected. When this happens, the condition is called diverticulitis. What are the causes? The cause of this condition is not known. What increases  the risk? The following factors may make you more likely to develop this condition:  Being older than age 43. Your risk for this condition increases with age. Diverticulosis is rare among people younger than age 85. By age 20, many people have it.  Eating a low-fiber diet.  Having frequent constipation.  Being overweight.  Not getting enough exercise.  Smoking.  Taking over-the-counter pain medicines, like aspirin and ibuprofen.  Having a family history of diverticulosis. What are the signs or symptoms? In most people, there are no symptoms of this condition. If you do have symptoms, they may include:  Bloating.  Cramps in the abdomen.  Constipation or diarrhea.  Pain in the lower left side of the abdomen. How is this diagnosed? Because diverticulosis usually has no symptoms, it is most often diagnosed during an exam for other colon problems. The condition may be diagnosed by:  Using a flexible scope to examine the colon (colonoscopy).  Taking an X-ray of the colon after dye has been put into the colon (barium enema).  Having a CT scan. How is this treated? You may not need treatment for this condition. Your health care provider may recommend treatment to prevent problems. You may need treatment if you have symptoms or if you previously had diverticulitis. Treatment may include:  Eating a high-fiber diet.  Taking a fiber supplement.  Taking a live bacteria supplement (probiotic).  Taking medicine to relax your colon.   Follow these instructions at home: Medicines  Take over-the-counter and prescription medicines only as told by your health care provider.  If told by your health care provider, take a  fiber supplement or probiotic. Constipation prevention Your condition may cause constipation. To prevent or treat constipation, you may need to:  Drink enough fluid to keep your urine pale yellow.  Take over-the-counter or prescription medicines.  Eat foods that are high in fiber, such as beans, whole grains, and fresh fruits and vegetables.  Limit foods that are high in fat and processed sugars, such as fried or sweet foods.   General instructions  Try not to strain when you have a bowel movement.  Keep all follow-up visits as told by your health care provider. This is important. Contact a health care provider if you:  Have pain in your abdomen.  Have bloating.  Have cramps.  Have not had a bowel movement in 3 days. Get help right away if:  Your pain gets worse.  Your bloating becomes very bad.  You have a fever or chills, and your symptoms suddenly get worse.  You vomit.  You have bowel movements that are bloody or black.  You have bleeding from your rectum. Summary  Diverticulosis is a condition that develops when small pouches (diverticula) form in the wall of the large intestine (colon).  You may have a few pouches or many of them.  This condition is most often diagnosed during an exam for other colon problems.  Treatment may include increasing the fiber in your diet, taking supplements, or taking medicines. This information is not intended to replace advice given to you by your health care provider. Make sure you discuss any questions you have with your health care provider. Document Revised: 05/18/2019 Document Reviewed: 05/18/2019 Elsevier Patient Education  [place].
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
## 2021-03-28 LAB — SURGICAL PATHOLOGY

## 2021-04-04 ENCOUNTER — Encounter (HOSPITAL_COMMUNITY): Payer: Self-pay | Admitting: Internal Medicine

## 2021-04-08 ENCOUNTER — Encounter (INDEPENDENT_AMBULATORY_CARE_PROVIDER_SITE_OTHER): Payer: Self-pay | Admitting: *Deleted

## 2021-04-29 ENCOUNTER — Other Ambulatory Visit: Payer: Self-pay

## 2021-04-29 ENCOUNTER — Encounter: Payer: Self-pay | Admitting: Family Medicine

## 2021-04-29 ENCOUNTER — Ambulatory Visit (INDEPENDENT_AMBULATORY_CARE_PROVIDER_SITE_OTHER): Payer: 59 | Admitting: Family Medicine

## 2021-04-29 ENCOUNTER — Other Ambulatory Visit (HOSPITAL_COMMUNITY): Payer: Self-pay

## 2021-04-29 VITALS — BP 140/90 | HR 110 | Resp 16 | Ht 66.0 in | Wt 239.1 lb

## 2021-04-29 DIAGNOSIS — G44211 Episodic tension-type headache, intractable: Secondary | ICD-10-CM

## 2021-04-29 DIAGNOSIS — I10 Essential (primary) hypertension: Secondary | ICD-10-CM | POA: Diagnosis not present

## 2021-04-29 DIAGNOSIS — R7302 Impaired glucose tolerance (oral): Secondary | ICD-10-CM | POA: Diagnosis not present

## 2021-04-29 DIAGNOSIS — F5101 Primary insomnia: Secondary | ICD-10-CM | POA: Diagnosis not present

## 2021-04-29 DIAGNOSIS — E559 Vitamin D deficiency, unspecified: Secondary | ICD-10-CM | POA: Diagnosis not present

## 2021-04-29 DIAGNOSIS — E785 Hyperlipidemia, unspecified: Secondary | ICD-10-CM

## 2021-04-29 DIAGNOSIS — F419 Anxiety disorder, unspecified: Secondary | ICD-10-CM | POA: Diagnosis not present

## 2021-04-29 MED ORDER — BUSPIRONE HCL 5 MG PO TABS
5.0000 mg | ORAL_TABLET | Freq: Two times a day (BID) | ORAL | 0 refills | Status: DC
  Filled 2021-04-29: qty 180, 90d supply, fill #0

## 2021-04-29 MED ORDER — AMLODIPINE BESYLATE 2.5 MG PO TABS
2.5000 mg | ORAL_TABLET | Freq: Every day | ORAL | 0 refills | Status: DC
  Filled 2021-04-29: qty 90, 90d supply, fill #0

## 2021-04-29 MED ORDER — PANTOPRAZOLE SODIUM 40 MG PO TBEC
40.0000 mg | DELAYED_RELEASE_TABLET | Freq: Every day | ORAL | 1 refills | Status: DC
  Filled 2021-04-29: qty 90, 90d supply, fill #0
  Filled 2021-11-02 – 2021-11-15 (×4): qty 90, 90d supply, fill #1

## 2021-04-29 NOTE — Patient Instructions (Addendum)
F/U in 8 weeks, re evaluate blood pressure and anxiety, call if you need me sooner  Nurse please give pt number to call employee assistance for therapy re anxiety  TSH, vit D, CBC, and fasting chem 7 and EGFR 3  to 5 days before next appointment  We do not do dental referrals, sorry about that!  Congrats on weight loss and regular exercise ,  keep it UP!  Thanks for choosing [facility], we consider it a privelige to serve you.

## 2021-04-30 ENCOUNTER — Other Ambulatory Visit (HOSPITAL_COMMUNITY): Payer: Self-pay

## 2021-04-30 ENCOUNTER — Telehealth: Payer: Self-pay

## 2021-04-30 MED ORDER — BUTALBITAL-APAP-CAFFEINE 50-325-40 MG PO TABS
1.0000 | ORAL_TABLET | Freq: Four times a day (QID) | ORAL | 0 refills | Status: DC | PRN
  Filled 2021-04-30: qty 14, 4d supply, fill #0

## 2021-04-30 NOTE — Telephone Encounter (Signed)
I spoke wih pt currently has a headache at 3 , tok nothing, at it's worst it kept  him up last night ,rated at 8 to 10 front I will send fioricet for bad headache to pharmacy, pt knows to take tylenol for headache, 1 to 2.  and  back of head, pounding , no nausea, no neurological symptoms

## 2021-04-30 NOTE — Progress Notes (Signed)
   Anthony Erickson     MRN: [id number]      DOB: 10-27-1961   HPI Anthony Erickson is here for follow up and re-evaluation of chronic medical conditions, medication management and review of any available recent lab and radiology data.  Preventive health is updated, specifically  Cancer screening and Immunization.   Needs dental work done and asks about a referral , we have none, Anthony Erickson will seek this independently Requests referral to a therapist as Anthony Erickson gas family concerns that Anthony Erickson want to address and needs help dealing with this 3 week h/o intermittent headaches, realized his BP is high also not sleeping as well as Anthony Erickson could due to anxiety/ stress   ROS Denies recent fever or chills. Denies sinus pressure, nasal congestion, ear pain or sore throat. Denies chest congestion, productive cough or wheezing. Denies chest pains, palpitations and leg swelling Denies abdominal pain, nausea, vomiting,diarrhea or constipation.   Denies dysuria, frequency, hesitancy or incontinence. Denies joint pain, swelling and limitation in mobility. Denies skin break down or rash.   PE  BP 140/90   Pulse (!) 110   Resp 16   Ht 5\' 6"  (1.676 m)   Wt 239 lb 1.9 oz (108.5 kg)   SpO2 95%   BMI 38.59 kg/m   Patient alert and oriented and in no cardiopulmonary distress.  HEENT: No facial asymmetry, EOMI,     Neck supple .  Chest: Clear to auscultation bilaterally.  CVS: S1, S2 no murmurs, no S3.Regular rate.  ABD: Soft non tender.   Ext: No edema  MS: Adequate ROM spine, shoulders, hips and knees.  Skin: Intact, no ulcerations or rash noted.  Psych: Good eye contact, normal affect. Memory intact not anxious or depressed appearing.  CNS: CN 2-12 intact, power,  normal throughout.no focal deficits noted.   Assessment & Plan  Essential hypertension Start lowdose amlodipine and re eval in 6 to 8 weeks DASH diet and commitment to daily physical activity for a minimum of 30 minutes discussed and encouraged,  as a part of hypertension management. The importance of attaining a healthy weight is also discussed.  BP/Weight 04/29/2021 03/27/2021 12/30/2020 12/26/2020 08/19/2020 07/01/2020 2/44/0102  Systolic BP 725 366 440 347 - 425 -  Diastolic BP 90 66 85 87 - 92 -  Wt. (Lbs) 239.12 237 245 244 246 242.04 242  BMI 38.59 38.25 39.54 39.38 39.71 39.07 39.06       Morbid obesity ([facility]) Improved  Patient re-educated about  the importance of commitment to a  minimum of 150 minutes of exercise per week as able.  The importance of healthy food choices with portion control discussed, as well as eating regularly and within a 12 hour window most days. The need to choose "clean , green" food 50 to 75% of the time is discussed, as well as to make water the primary drink and set a goal of 64 ounces water daily.    Weight /BMI 04/29/2021 03/27/2021 12/30/2020  WEIGHT 239 lb 1.9 oz 237 lb 245 lb  HEIGHT 5\' 6"  5\' 6"  5\' 6"   BMI 38.59 kg/m2 38.25 kg/m2 39.54 kg/m2      Headache 3 week history, intermittent Tylenol and trial of fioricet if no response. Likely triggers are anxiety , poor sleep and new HTN No focal neurologic deficits by history or on exam  Anxiety Start buspar and pt to contact employee health for mental health needs, contact info provided

## 2021-04-30 NOTE — Telephone Encounter (Signed)
Pt called states that his headache has gotten worse, was up all night , laying in the bed, trying to stay call.. would like a call back

## 2021-04-30 NOTE — Telephone Encounter (Signed)
Was up most of the night with a bad headache, was going to get the new BP med this am. Pain now about a 6, pain during the night was about a 10. Wants to know what he should do

## 2021-05-05 ENCOUNTER — Encounter: Payer: Self-pay | Admitting: Family Medicine

## 2021-05-05 DIAGNOSIS — F419 Anxiety disorder, unspecified: Secondary | ICD-10-CM | POA: Insufficient documentation

## 2021-05-05 DIAGNOSIS — R519 Headache, unspecified: Secondary | ICD-10-CM | POA: Insufficient documentation

## 2021-05-05 HISTORY — DX: Anxiety disorder, unspecified: F41.9

## 2021-05-05 NOTE — Assessment & Plan Note (Signed)
3 week history, intermittent Tylenol and trial of fioricet if no response. Likely triggers are anxiety , poor sleep and new HTN No focal neurologic deficits by history or on exam

## 2021-05-05 NOTE — Assessment & Plan Note (Signed)
Improved  Patient re-educated about  the importance of commitment to a  minimum of 150 minutes of exercise per week as able.  The importance of healthy food choices with portion control discussed, as well as eating regularly and within a 12 hour window most days. The need to choose "clean , green" food 50 to 75% of the time is discussed, as well as to make water the primary drink and set a goal of 64 ounces water daily.    Weight /BMI 04/29/2021 03/27/2021 12/30/2020  WEIGHT 239 lb 1.9 oz 237 lb 245 lb  HEIGHT 5\' 6"  5\' 6"  5\' 6"   BMI 38.59 kg/m2 38.25 kg/m2 39.54 kg/m2

## 2021-05-05 NOTE — Assessment & Plan Note (Signed)
Start lowdose amlodipine and re eval in 6 to 8 weeks DASH diet and commitment to daily physical activity for a minimum of 30 minutes discussed and encouraged, as a part of hypertension management. The importance of attaining a healthy weight is also discussed.  BP/Weight 04/29/2021 03/27/2021 12/30/2020 12/26/2020 08/19/2020 07/01/2020 5/73/2202  Systolic BP 542 706 237 628 - 315 -  Diastolic BP 90 66 85 87 - 92 -  Wt. (Lbs) 239.12 237 245 244 246 242.04 242  BMI 38.59 38.25 39.54 39.38 39.71 39.07 39.06

## 2021-05-05 NOTE — Assessment & Plan Note (Signed)
Start buspar and pt to contact employee health for mental health needs, contact info provided

## 2021-06-11 DIAGNOSIS — E559 Vitamin D deficiency, unspecified: Secondary | ICD-10-CM | POA: Diagnosis not present

## 2021-06-11 DIAGNOSIS — R7302 Impaired glucose tolerance (oral): Secondary | ICD-10-CM | POA: Diagnosis not present

## 2021-06-11 DIAGNOSIS — F5101 Primary insomnia: Secondary | ICD-10-CM | POA: Diagnosis not present

## 2021-06-11 DIAGNOSIS — R030 Elevated blood-pressure reading, without diagnosis of hypertension: Secondary | ICD-10-CM | POA: Diagnosis not present

## 2021-06-11 DIAGNOSIS — E785 Hyperlipidemia, unspecified: Secondary | ICD-10-CM | POA: Diagnosis not present

## 2021-06-12 LAB — BMP8+EGFR
BUN/Creatinine Ratio: 18 (ref 9–20)
BUN: 17 mg/dL (ref 6–24)
CO2: 23 mmol/L (ref 20–29)
Calcium: 9.5 mg/dL (ref 8.7–10.2)
Chloride: 102 mmol/L (ref 96–106)
Creatinine, Ser: 0.97 mg/dL (ref 0.76–1.27)
Glucose: 107 mg/dL — ABNORMAL HIGH (ref 65–99)
Potassium: 4.5 mmol/L (ref 3.5–5.2)
Sodium: 138 mmol/L (ref 134–144)
eGFR: 90 mL/min/{1.73_m2} (ref >59)

## 2021-06-12 LAB — CBC WITH DIFFERENTIAL/PLATELET
Basophils Absolute: 0.1 10*3/uL (ref 0.0–0.2)
Basos: 1 %
EOS (ABSOLUTE): 0 10*3/uL (ref 0.0–0.4)
Eos: 1 %
Hematocrit: 45.3 % (ref 37.5–51.0)
Hemoglobin: 14.5 g/dL (ref 13.0–17.7)
Immature Grans (Abs): 0 10*3/uL (ref 0.0–0.1)
Immature Granulocytes: 0 %
Lymphocytes Absolute: 3 10*3/uL (ref 0.7–3.1)
Lymphs: 50 %
MCH: 30.7 pg (ref 26.6–33.0)
MCHC: 32 g/dL (ref 31.5–35.7)
MCV: 96 fL (ref 79–97)
Monocytes Absolute: 0.7 10*3/uL (ref 0.1–0.9)
Monocytes: 11 %
Neutrophils Absolute: 2.2 10*3/uL (ref 1.4–7.0)
Neutrophils: 37 %
Platelets: 225 10*3/uL (ref 150–450)
RBC: 4.73 x10E6/uL (ref 4.14–5.80)
RDW: 12 % (ref 11.6–15.4)
WBC: 6 10*3/uL (ref 3.4–10.8)

## 2021-06-12 LAB — TSH: TSH: 1.21 u[IU]/mL (ref 0.450–4.500)

## 2021-06-12 LAB — VITAMIN D 25 HYDROXY (VIT D DEFICIENCY, FRACTURES): Vit D, 25-Hydroxy: 21.6 ng/mL — ABNORMAL LOW (ref 30.0–100.0)

## 2021-06-19 ENCOUNTER — Other Ambulatory Visit: Payer: 59

## 2021-06-19 ENCOUNTER — Other Ambulatory Visit: Payer: Self-pay

## 2021-06-19 DIAGNOSIS — Z7689 Persons encountering health services in other specified circumstances: Secondary | ICD-10-CM | POA: Diagnosis not present

## 2021-06-19 DIAGNOSIS — Z8546 Personal history of malignant neoplasm of prostate: Secondary | ICD-10-CM | POA: Diagnosis not present

## 2021-06-20 LAB — PSA: Prostate Specific Ag, Serum: 1.4 ng/mL (ref 0.0–4.0)

## 2021-06-21 ENCOUNTER — Encounter: Payer: Self-pay | Admitting: Urology

## 2021-06-24 ENCOUNTER — Encounter: Payer: Self-pay | Admitting: Family Medicine

## 2021-06-24 ENCOUNTER — Ambulatory Visit: Payer: 59 | Admitting: Family Medicine

## 2021-06-24 ENCOUNTER — Other Ambulatory Visit (HOSPITAL_COMMUNITY): Payer: Self-pay

## 2021-06-24 ENCOUNTER — Other Ambulatory Visit: Payer: Self-pay

## 2021-06-24 VITALS — BP 141/93 | HR 86 | Temp 98.1°F | Resp 18 | Ht 66.0 in | Wt 236.1 lb

## 2021-06-24 DIAGNOSIS — I10 Essential (primary) hypertension: Secondary | ICD-10-CM | POA: Diagnosis not present

## 2021-06-24 DIAGNOSIS — E559 Vitamin D deficiency, unspecified: Secondary | ICD-10-CM | POA: Insufficient documentation

## 2021-06-24 DIAGNOSIS — G4719 Other hypersomnia: Secondary | ICD-10-CM | POA: Insufficient documentation

## 2021-06-24 DIAGNOSIS — F419 Anxiety disorder, unspecified: Secondary | ICD-10-CM | POA: Diagnosis not present

## 2021-06-24 DIAGNOSIS — R7302 Impaired glucose tolerance (oral): Secondary | ICD-10-CM

## 2021-06-24 DIAGNOSIS — M79671 Pain in right foot: Secondary | ICD-10-CM | POA: Diagnosis not present

## 2021-06-24 DIAGNOSIS — M79672 Pain in left foot: Secondary | ICD-10-CM

## 2021-06-24 DIAGNOSIS — R069 Unspecified abnormalities of breathing: Secondary | ICD-10-CM | POA: Insufficient documentation

## 2021-06-24 LAB — POCT GLYCOSYLATED HEMOGLOBIN (HGB A1C): Hemoglobin A1C: 5.5 % (ref 4.0–5.6)

## 2021-06-24 MED ORDER — ERGOCALCIFEROL 1.25 MG (50000 UT) PO CAPS
50000.0000 [IU] | ORAL_CAPSULE | ORAL | 2 refills | Status: DC
  Filled 2021-06-24: qty 12, 84d supply, fill #0
  Filled 2022-04-02: qty 12, 84d supply, fill #1

## 2021-06-24 MED ORDER — BUSPIRONE HCL 5 MG PO TABS
5.0000 mg | ORAL_TABLET | Freq: Two times a day (BID) | ORAL | 3 refills | Status: DC
  Filled 2021-06-24: qty 180, 90d supply, fill #0

## 2021-06-24 MED ORDER — AMLODIPINE BESYLATE 5 MG PO TABS
5.0000 mg | ORAL_TABLET | Freq: Every day | ORAL | 0 refills | Status: DC
  Filled 2021-06-24: qty 90, 90d supply, fill #0

## 2021-06-24 NOTE — Assessment & Plan Note (Signed)
Improving  Patient re-educated about  the importance of commitment to a  minimum of 150 minutes of exercise per week as able.  The importance of healthy food choices with portion control discussed, as well as eating regularly and within a 12 hour window most days. The need to choose "clean , green" food 50 to 75% of the time is discussed, as well as to make water the primary drink and set a goal of 64 ounces water daily.    Weight /BMI 06/24/2021 04/29/2021 03/27/2021  WEIGHT 236 lb 1.9 oz 239 lb 1.9 oz 237 lb  HEIGHT '5\' 6"'$  '5\' 6"'$  '5\' 6"'$   BMI 38.11 kg/m2 38.59 kg/m2 38.25 kg/m2

## 2021-06-24 NOTE — Assessment & Plan Note (Signed)
Patient educated about the importance of limiting  Carbohydrate intake , the need to commit to daily physical activity for a minimum of 30 minutes , and to commit weight loss. The fact that changes in all these areas will reduce or eliminate all together the development of diabetes is stressed.   Diabetic Labs Latest Ref Rng & Units 06/24/2021 06/11/2021 12/10/2020 07/01/2020 07/01/2020  HbA1c 4.0 - 5.6 % 5.5 - - 5.3(A) 5.3  Chol 100 - 199 mg/dL - - 160 - -  HDL >39 mg/dL - - 38(L) - -  Calc LDL 0 - 99 mg/dL - - 98 - -  Triglycerides 0 - 149 mg/dL - - 137 - -  Creatinine 0.76 - 1.27 mg/dL - 0.97 1.13 - -   BP/Weight 06/24/2021 04/29/2021 03/27/2021 12/30/2020 12/26/2020 08/19/2020 0000000  Systolic BP Q000111Q XX123456 XX123456 XX123456 0000000 - [id number]  Diastolic BP 93 90 66 85 87 - 92  Wt. (Lbs) 236.12 239.12 237 245 244 246 242.04  BMI 38.11 38.59 38.25 39.54 39.38 39.71 39.07   No flowsheet data found.

## 2021-06-24 NOTE — Assessment & Plan Note (Signed)
Uncontrolled, inc amlodipine dose and re eval in 8 to 10 weeks DASH diet and commitment to daily physical activity for a minimum of 30 minutes discussed and encouraged, as a part of hypertension management. The importance of attaining a healthy weight is also discussed.  BP/Weight 06/24/2021 04/29/2021 03/27/2021 12/30/2020 12/26/2020 08/19/2020 0000000  Systolic BP Q000111Q XX123456 XX123456 XX123456 0000000 - [id number]  Diastolic BP 93 90 66 85 87 - 92  Wt. (Lbs) 236.12 239.12 237 245 244 246 242.04  BMI 38.11 38.59 38.25 39.54 39.38 39.71 39.07

## 2021-06-24 NOTE — Assessment & Plan Note (Signed)
Controlled, no change in medication  

## 2021-06-24 NOTE — Patient Instructions (Signed)
Annual exam in 8 to 10 weeks, call if you need me sooner  New higher dose of amlodipine 5 mg one  daily  New once weekly vit D  Glycohb in office today, please explain the result to the pt, he knows normal is under 5.6, diabetes is 6.5  Continue to reduce fatty foods and sweets   You are referred to Dr Aline Brochure re foot pain, and for evaluation for sleep apnea  Please give appt for pulmmonary at checkout if possible  It is important that you exercise regularly at least 30 minutes 5 times a week. If you develop chest pain, have severe difficulty breathing, or feel very tired, stop exercising immediately and seek medical attention   Think about what you will eat, plan ahead. Choose " clean, green, fresh or frozen" over canned, processed or packaged foods which are more sugary, salty and fatty. 70 to 75% of food eaten should be vegetables and fruit. Three meals at set times with snacks allowed between meals, but they must be fruit or vegetables. Aim to eat over a 12 hour period , example 7 am to 7 pm, and STOP after  your last meal of the day. Drink water,generally about 64 ounces per day, no other drink is as healthy. Fruit juice is best enjoyed in a healthy way, by EATING the fruit. Thanks for choosing [facility], we consider it a privelige to serve you.

## 2021-06-24 NOTE — Progress Notes (Signed)
Anthony Erickson     MRN: [id number]      DOB: 1961/07/16   HPI Anthony Erickson is here for follow up and re-evaluation of chronic medical conditions, medication management and review of any available recent lab and radiology data.  Preventive health is updated, specifically  Cancer screening and Immunization.   Questions or concerns regarding consultations or procedures which the PT has had in the interim are  addressed. The PT denies any adverse reactions to current medications since the last visit. Tolerating and benefiting from buspar, denies anxierty Bilateral intermittent foot pain increased in past 2 to 3 months, difficult to take the first 2 steps, has benefited from injections in the past Following low salt diet and exercising regularly C/o snoring and daytime sleepiness  ROS Denies recent fever or chills. Denies sinus pressure, nasal congestion, ear pain or sore throat. Denies chest congestion, productive cough or wheezing. Denies chest pains, palpitations and leg swelling Denies abdominal pain, nausea, vomiting,diarrhea or constipation.   Denies dysuria, frequency, hesitancy or incontinence.  Denies headaches, seizures, numbness, or tingling. Denies depression, anxiety or insomnia. Denies skin break down or rash.   PE  BP (!) 141/93   Pulse 86   Temp 98.1 F (36.7 C)   Resp 18   Ht '5\' 6"'$  (1.676 m)   Wt 236 lb 1.9 oz (107.1 kg)   SpO2 96%   BMI 38.11 kg/m   Patient alert and oriented and in no cardiopulmonary distress.  HEENT: No facial asymmetry, EOMI,     Neck supple .thick neck  Chest: Clear to auscultation bilaterally.  CVS: S1, S2 no murmurs, no S3.Regular rate.  ABD: Soft non tender.   Ext: No edema  MS: Adequate ROM spine, shoulders, hips and knees.Tender to palpation over insteps  Skin: Intact, no ulcerations or rash noted.  Psych: Good eye contact, normal affect. Memory intact not anxious or depressed appearing.  CNS: CN 2-12 intact, power,   normal throughout.no focal deficits noted.   Assessment & Plan  Abnormal breathing Reports waking up in his sleep catching his breath, and daytime sleepiness, has thick neck needs OSA eval  Morbid obesity ([place]) Improving  Patient re-educated about  the importance of commitment to a  minimum of 150 minutes of exercise per week as able.  The importance of healthy food choices with portion control discussed, as well as eating regularly and within a 12 hour window most days. The need to choose "clean , green" food 50 to 75% of the time is discussed, as well as to make water the primary drink and set a goal of 64 ounces water daily.    Weight /BMI 06/24/2021 04/29/2021 03/27/2021  WEIGHT 236 lb 1.9 oz 239 lb 1.9 oz 237 lb  HEIGHT '5\' 6"'$  '5\' 6"'$  '5\' 6"'$   BMI 38.11 kg/m2 38.59 kg/m2 38.25 kg/m2      Essential hypertension Uncontrolled, inc amlodipine dose and re eval in 8 to 10 weeks DASH diet and commitment to daily physical activity for a minimum of 30 minutes discussed and encouraged, as a part of hypertension management. The importance of attaining a healthy weight is also discussed.  BP/Weight 06/24/2021 04/29/2021 03/27/2021 12/30/2020 12/26/2020 08/19/2020 0000000  Systolic BP Q000111Q XX123456 XX123456 XX123456 0000000 - [id number]  Diastolic BP 93 90 66 85 87 - 92  Wt. (Lbs) 236.12 239.12 237 245 244 246 242.04  BMI 38.11 38.59 38.25 39.54 39.38 39.71 39.07       Anxiety Controlled, no change in  medication   Foot pain, bilateral Refer Ortho has had plantar injections in the past with good result  IGT (impaired glucose tolerance) Patient educated about the importance of limiting  Carbohydrate intake , the need to commit to daily physical activity for a minimum of 30 minutes , and to commit weight loss. The fact that changes in all these areas will reduce or eliminate all together the development of diabetes is stressed.   Diabetic Labs Latest Ref Rng & Units 06/24/2021 06/11/2021 12/10/2020 07/01/2020 07/01/2020   HbA1c 4.0 - 5.6 % 5.5 - - 5.3(A) 5.3  Chol 100 - 199 mg/dL - - 160 - -  HDL >39 mg/dL - - 38(L) - -  Calc LDL 0 - 99 mg/dL - - 98 - -  Triglycerides 0 - 149 mg/dL - - 137 - -  Creatinine 0.76 - 1.27 mg/dL - 0.97 1.13 - -   BP/Weight 06/24/2021 04/29/2021 03/27/2021 12/30/2020 12/26/2020 08/19/2020 0000000  Systolic BP Q000111Q XX123456 XX123456 XX123456 0000000 - [id number]  Diastolic BP 93 90 66 85 87 - 92  Wt. (Lbs) 236.12 239.12 237 245 244 246 242.04  BMI 38.11 38.59 38.25 39.54 39.38 39.71 39.07   No flowsheet data found.

## 2021-06-24 NOTE — Assessment & Plan Note (Signed)
Reports waking up in his sleep catching his breath, and daytime sleepiness, has thick neck needs OSA eval

## 2021-06-24 NOTE — Assessment & Plan Note (Signed)
Refer Ortho has had plantar injections in the past with good result

## 2021-06-26 ENCOUNTER — Encounter: Payer: Self-pay | Admitting: Urology

## 2021-06-26 ENCOUNTER — Ambulatory Visit (INDEPENDENT_AMBULATORY_CARE_PROVIDER_SITE_OTHER): Payer: 59 | Admitting: Urology

## 2021-06-26 ENCOUNTER — Other Ambulatory Visit: Payer: Self-pay

## 2021-06-26 VITALS — BP 137/78 | HR 91

## 2021-06-26 DIAGNOSIS — Z8546 Personal history of malignant neoplasm of prostate: Secondary | ICD-10-CM | POA: Diagnosis not present

## 2021-06-26 DIAGNOSIS — R9721 Rising PSA following treatment for malignant neoplasm of prostate: Secondary | ICD-10-CM

## 2021-06-26 LAB — URINALYSIS, ROUTINE W REFLEX MICROSCOPIC
Bilirubin, UA: NEGATIVE
Glucose, UA: NEGATIVE
Ketones, UA: NEGATIVE
Leukocytes,UA: NEGATIVE
Nitrite, UA: NEGATIVE
Protein,UA: NEGATIVE
RBC, UA: NEGATIVE
Specific Gravity, UA: >1.03 — ABNORMAL HIGH (ref 1.005–1.030)
Urobilinogen, Ur: 0.2 mg/dL (ref 0.2–1.0)
pH, UA: 6 (ref 5.0–7.5)

## 2021-06-26 NOTE — Progress Notes (Signed)
Urological Symptom Review  Patient is experiencing the following symptoms: none   Review of Systems  Gastrointestinal (upper)  : Negative for upper GI symptoms  Gastrointestinal (lower) : Negative for lower GI symptoms  Constitutional : Fatigue  Skin: Negative for skin symptoms  Eyes: Negative for eye symptoms  Ear/Nose/Throat : Negative for Ear/Nose/Throat symptoms  Hematologic/Lymphatic: Negative for Hematologic/Lymphatic symptoms  Cardiovascular : Negative for cardiovascular symptoms  Respiratory : Negative for respiratory symptoms  Endocrine: Negative for endocrine symptoms  Musculoskeletal: Negative for musculoskeletal symptoms  Neurological: Headaches  Psychologic: Negative for psychiatric symptoms
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
## 2021-06-27 ENCOUNTER — Other Ambulatory Visit (HOSPITAL_COMMUNITY): Payer: Self-pay

## 2021-07-02 ENCOUNTER — Other Ambulatory Visit (HOSPITAL_COMMUNITY): Payer: Self-pay

## 2021-07-09 ENCOUNTER — Other Ambulatory Visit: Payer: 59

## 2021-07-10 ENCOUNTER — Other Ambulatory Visit: Payer: Self-pay

## 2021-07-10 ENCOUNTER — Other Ambulatory Visit: Payer: 59

## 2021-07-10 DIAGNOSIS — Z8546 Personal history of malignant neoplasm of prostate: Secondary | ICD-10-CM | POA: Diagnosis not present

## 2021-07-10 DIAGNOSIS — R9721 Rising PSA following treatment for malignant neoplasm of prostate: Secondary | ICD-10-CM

## 2021-07-11 ENCOUNTER — Encounter: Payer: Self-pay | Admitting: Urology

## 2021-07-11 LAB — TESTOSTERONE: Testosterone: 337 ng/dL (ref 264–916)

## 2021-07-11 LAB — PSA: Prostate Specific Ag, Serum: 0.4 ng/mL (ref 0.0–4.0)

## 2021-07-14 ENCOUNTER — Encounter: Payer: Self-pay | Admitting: Orthopedic Surgery

## 2021-07-14 ENCOUNTER — Telehealth: Payer: Self-pay

## 2021-07-14 ENCOUNTER — Ambulatory Visit: Payer: 59

## 2021-07-14 ENCOUNTER — Other Ambulatory Visit (HOSPITAL_COMMUNITY): Payer: Self-pay

## 2021-07-14 ENCOUNTER — Other Ambulatory Visit: Payer: Self-pay

## 2021-07-14 ENCOUNTER — Ambulatory Visit (INDEPENDENT_AMBULATORY_CARE_PROVIDER_SITE_OTHER): Payer: 59 | Admitting: Orthopedic Surgery

## 2021-07-14 VITALS — BP 150/92 | HR 99 | Ht 66.0 in | Wt 236.0 lb

## 2021-07-14 DIAGNOSIS — M79671 Pain in right foot: Secondary | ICD-10-CM

## 2021-07-14 DIAGNOSIS — M79672 Pain in left foot: Secondary | ICD-10-CM | POA: Diagnosis not present

## 2021-07-14 MED ORDER — MELOXICAM 7.5 MG PO TABS
7.5000 mg | ORAL_TABLET | Freq: Every day | ORAL | 5 refills | Status: DC
  Filled 2021-07-14: qty 30, 30d supply, fill #0

## 2021-07-14 NOTE — Progress Notes (Signed)
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    Appearance: He is well-developed.     Comments: Well developed, well nourished Normal grooming and hygiene     Cardiovascular:     Comments: No peripheral edema Skin:    General: Skin is warm and dry.  Neurological:     Mental Status: He is alert and oriented to person, place, and time.     Sensory: No sensory deficit.     Coordination: Coordination normal.     Gait: Gait normal.     Deep Tendon Reflexes: Reflexes are normal and symmetric.  Psychiatric:        Mood and Affect: Mood normal.        Behavior: Behavior normal.        Thought Content: Thought content normal.        Judgment: Judgment normal.     Comments: Affect normal    In the standing position both feet are noted to have a flattening of the arch but on tiptoe standing this is flexible  Palpable tenderness over the dorsum of both mid feet and the talonavicular and midfoot area with some plantar pain in the midfoot as well the heel is nontender  Neurovascular exam is otherwise intact  X-rays show some talonavicular arthritis there is fragmentation of the dorsum of the talus bilaterally  There is mild flattening of the arch with decreased calcaneal pitch  The patient is already tried over-the-counter orthotics he has tried a comfortable shoe  Recommend biotech see him for custom orthotics and he will be started on meloxicam

## 2021-07-14 NOTE — Telephone Encounter (Signed)
Patient notified via mychart

## 2021-07-14 NOTE — Telephone Encounter (Signed)
Please advise-  I know he has hx of prostate cancer- not sure how to answer him.

## 2021-07-14 NOTE — Telephone Encounter (Signed)
-----   Message from Irine Seal, MD sent at 07/11/2021  4:30 PM EDT ----- His PSA is down to 0.4 from 1.4 and his testosterone level is normal.   I would just like the PSA repeated prior to his visit in December as planned.   ----- Message ----- From: Dorisann Frames, RN Sent: 07/11/2021  10:59 AM EDT To: Irine Seal, MD  Please review

## 2021-07-22 ENCOUNTER — Other Ambulatory Visit (HOSPITAL_COMMUNITY): Payer: Self-pay

## 2021-08-19 ENCOUNTER — Institutional Professional Consult (permissible substitution): Payer: 59 | Admitting: Pulmonary Disease

## 2021-08-20 ENCOUNTER — Ambulatory Visit: Payer: 59 | Admitting: Family Medicine

## 2021-08-21 ENCOUNTER — Other Ambulatory Visit: Payer: Self-pay

## 2021-08-21 ENCOUNTER — Ambulatory Visit (INDEPENDENT_AMBULATORY_CARE_PROVIDER_SITE_OTHER): Payer: 59 | Admitting: Family Medicine

## 2021-08-21 ENCOUNTER — Encounter: Payer: Self-pay | Admitting: Family Medicine

## 2021-08-21 VITALS — BP 120/80 | HR 90 | Resp 16 | Ht 66.0 in | Wt 240.0 lb

## 2021-08-21 DIAGNOSIS — F419 Anxiety disorder, unspecified: Secondary | ICD-10-CM

## 2021-08-21 DIAGNOSIS — C61 Malignant neoplasm of prostate: Secondary | ICD-10-CM

## 2021-08-21 DIAGNOSIS — G44211 Episodic tension-type headache, intractable: Secondary | ICD-10-CM | POA: Diagnosis not present

## 2021-08-21 DIAGNOSIS — E785 Hyperlipidemia, unspecified: Secondary | ICD-10-CM | POA: Diagnosis not present

## 2021-08-21 DIAGNOSIS — E559 Vitamin D deficiency, unspecified: Secondary | ICD-10-CM

## 2021-08-21 DIAGNOSIS — K219 Gastro-esophageal reflux disease without esophagitis: Secondary | ICD-10-CM

## 2021-08-21 DIAGNOSIS — I10 Essential (primary) hypertension: Secondary | ICD-10-CM

## 2021-08-21 NOTE — Patient Instructions (Addendum)
Annual exam early March, call if you need me sooner  Need covid vaccine  Fasting lipid, cmp and EGFr and vit d 3 days before visit  It is important that you exercise regularly at least 30 minutes 5 times a week. If you develop chest pain, have severe difficulty breathing, or feel very tired, stop exercising immediately and seek medical attention   Think about what you will eat, plan ahead. Choose " clean, green, fresh or frozen" over canned, processed or packaged foods which are more sugary, salty and fatty. 70 to 75% of food eaten should be vegetables and fruit. Three meals at set times with snacks allowed between meals, but they must be fruit or vegetables. Aim to eat over a 12 hour period , example 7 am to 7 pm, and STOP after  your last meal of the day. Drink water,generally about 64 ounces per day, no other drink is as healthy. Fruit juice is best enjoyed in a healthy way, by EATING the fruit.  

## 2021-08-24 ENCOUNTER — Encounter: Payer: Self-pay | Admitting: Family Medicine

## 2021-08-24 NOTE — Assessment & Plan Note (Signed)
Controlled, no change in medication  

## 2021-08-24 NOTE — Assessment & Plan Note (Signed)
Followed by Urology, no active disease

## 2021-08-24 NOTE — Assessment & Plan Note (Signed)
  Patient re-educated about  the importance of commitment to a  minimum of 150 minutes of exercise per week as able.  The importance of healthy food choices with portion control discussed, as well as eating regularly and within a 12 hour window most days. The need to choose "clean , green" food 50 to 75% of the time is discussed, as well as to make water the primary drink and set a goal of 64 ounces water daily.    Weight /BMI 08/21/2021 07/14/2021 06/24/2021  WEIGHT 240 lb 236 lb 236 lb 1.9 oz  HEIGHT 5\' 6"  5\' 6"  5\' 6"   BMI 38.74 kg/m2 38.09 kg/m2 38.11 kg/m2

## 2021-08-24 NOTE — Progress Notes (Signed)
   Anthony Erickson     MRN: [id number]      DOB: Dec 26, 1960   HPI Anthony Erickson is here for follow up and re-evaluation of chronic medical conditions, medication management and review of any available recent lab and radiology data.  Preventive health is updated, specifically  Cancer screening and Immunization.   Questions or concerns regarding consultations or procedures which the PT has had in the interim are  addressed. The PT denies any adverse reactions to current medications since the last visit.  There are no new concerns.  There are no specific complaints   ROS Denies recent fever or chills. Denies sinus pressure, nasal congestion, ear pain or sore throat. Denies chest congestion, productive cough or wheezing. Denies chest pains, palpitations and leg swelling Denies abdominal pain, nausea, vomiting,diarrhea or constipation.   Denies dysuria, frequency, hesitancy or incontinence. C/o intermittent  joint pain,  and limitation in mobility. Denies headaches, seizures, numbness, or tingling. Denies depression, anxiety or insomnia. Denies skin break down or rash.   PE  BP 120/80   Pulse 90   Resp 16   Ht 5\' 6"  (1.676 m)   Wt 240 lb (108.9 kg)   SpO2 94%   BMI 38.74 kg/m   Patient alert and oriented and in no cardiopulmonary distress.  HEENT: No facial asymmetry, EOMI,     Neck supple .  Chest: Clear to auscultation bilaterally.  CVS: S1, S2 no murmurs, no S3.Regular rate.  ABD: Soft non tender.   Ext: No edema  MS: Adequate ROM spine, shoulders, hips and knees.  Skin: Intact, no ulcerations or rash noted.  Psych: Good eye contact, normal affect. Memory intact not anxious or depressed appearing.  CNS: CN 2-12 intact, power,  normal throughout.no focal deficits noted.   Assessment & Plan  Essential hypertension Controlled, no change in medication DASH diet and commitment to daily physical activity for a minimum of 30 minutes discussed and encouraged, as a part  of hypertension management. The importance of attaining a healthy weight is also discussed.  BP/Weight 08/21/2021 07/14/2021 06/26/2021 06/24/2021 04/29/2021 03/27/2021 3/79/0240  Systolic BP 973 532 992 426 834 196 222  Diastolic BP 80 92 78 93 90 66 85  Wt. (Lbs) 240 236 - 236.12 239.12 237 245  BMI 38.74 38.09 - 38.11 38.59 38.25 39.54       Headache Controlled, no change in medication   Morbid obesity ([place])  Patient re-educated about  the importance of commitment to a  minimum of 150 minutes of exercise per week as able.  The importance of healthy food choices with portion control discussed, as well as eating regularly and within a 12 hour window most days. The need to choose "clean , green" food 50 to 75% of the time is discussed, as well as to make water the primary drink and set a goal of 64 ounces water daily.    Weight /BMI 08/21/2021 07/14/2021 06/24/2021  WEIGHT 240 lb 236 lb 236 lb 1.9 oz  HEIGHT 5\' 6"  5\' 6"  5\' 6"   BMI 38.74 kg/m2 38.09 kg/m2 38.11 kg/m2      GERD Controlled, no change in medication   Anxiety Controlled, no change in medication   Adenocarcinoma of prostate ([place]) Followed by Urology, no active disease

## 2021-08-24 NOTE — Assessment & Plan Note (Signed)
Controlled, no change in medication DASH diet and commitment to daily physical activity for a minimum of 30 minutes discussed and encouraged, as a part of hypertension management. The importance of attaining a healthy weight is also discussed.  BP/Weight 08/21/2021 07/14/2021 06/26/2021 06/24/2021 04/29/2021 03/27/2021 4/80/1655  Systolic BP 374 827 078 675 449 201 007  Diastolic BP 80 92 78 93 90 66 85  Wt. (Lbs) 240 236 - 236.12 239.12 237 245  BMI 38.74 38.09 - 38.11 38.59 38.25 39.54

## 2021-09-11 ENCOUNTER — Ambulatory Visit (INDEPENDENT_AMBULATORY_CARE_PROVIDER_SITE_OTHER): Payer: 59 | Admitting: Pulmonary Disease

## 2021-09-11 ENCOUNTER — Encounter: Payer: Self-pay | Admitting: Pulmonary Disease

## 2021-09-11 ENCOUNTER — Other Ambulatory Visit: Payer: Self-pay

## 2021-09-11 DIAGNOSIS — G4710 Hypersomnia, unspecified: Secondary | ICD-10-CM | POA: Diagnosis not present

## 2021-09-11 NOTE — Addendum Note (Signed)
Addended by: Fritzi Mandes D on: 09/11/2021 10:55 AM   Modules accepted: Orders

## 2021-09-11 NOTE — Patient Instructions (Signed)
Home sleep test 
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
## 2021-09-11 NOTE — Assessment & Plan Note (Signed)
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  He generally naps after coming back from work.  When left to his own devices on his days off, he prefers to sleep late around 11 PM and wake up late.  This may be the main issue. However given his obesity, no pharyngeal exam we should rule out sleep apnea, no bed partner history is available today.  We will proceed with a home sleep test to clarify   The pathophysiology of obstructive sleep apnea , it's cardiovascular consequences & modes of treatment including CPAP were discused with the patient in detail & they evidenced understanding.

## 2021-09-22 ENCOUNTER — Other Ambulatory Visit: Payer: 59

## 2021-09-23 ENCOUNTER — Other Ambulatory Visit: Payer: 59

## 2021-09-23 ENCOUNTER — Other Ambulatory Visit: Payer: Self-pay

## 2021-09-23 DIAGNOSIS — Z8546 Personal history of malignant neoplasm of prostate: Secondary | ICD-10-CM | POA: Diagnosis not present

## 2021-09-23 DIAGNOSIS — R9721 Rising PSA following treatment for malignant neoplasm of prostate: Secondary | ICD-10-CM | POA: Diagnosis not present

## 2021-09-24 LAB — PSA: Prostate Specific Ag, Serum: 0.3 ng/mL (ref 0.0–4.0)

## 2021-10-02 ENCOUNTER — Ambulatory Visit: Payer: 59 | Admitting: Urology

## 2021-10-06 ENCOUNTER — Telehealth: Payer: Self-pay | Admitting: Family Medicine

## 2021-10-06 ENCOUNTER — Other Ambulatory Visit: Payer: Self-pay | Admitting: *Deleted

## 2021-10-06 ENCOUNTER — Other Ambulatory Visit (HOSPITAL_COMMUNITY): Payer: Self-pay

## 2021-10-06 MED ORDER — AMLODIPINE BESYLATE 5 MG PO TABS
5.0000 mg | ORAL_TABLET | Freq: Every day | ORAL | 0 refills | Status: DC
  Filled 2021-10-06: qty 90, 90d supply, fill #0

## 2021-10-06 NOTE — Telephone Encounter (Signed)
Rx sent in to pharmacy. 

## 2021-10-06 NOTE — Telephone Encounter (Signed)
Pt called in for refill on   amLODipine (NORVASC) 5 MG tablet

## 2021-10-13 ENCOUNTER — Ambulatory Visit: Payer: 59 | Admitting: Orthopedic Surgery

## 2021-10-16 ENCOUNTER — Other Ambulatory Visit: Payer: Self-pay

## 2021-10-16 ENCOUNTER — Ambulatory Visit (INDEPENDENT_AMBULATORY_CARE_PROVIDER_SITE_OTHER): Payer: 59 | Admitting: Urology

## 2021-10-16 ENCOUNTER — Encounter: Payer: Self-pay | Admitting: Urology

## 2021-10-16 VITALS — BP 133/79 | HR 97

## 2021-10-16 DIAGNOSIS — Z8546 Personal history of malignant neoplasm of prostate: Secondary | ICD-10-CM | POA: Diagnosis not present

## 2021-10-16 DIAGNOSIS — R972 Elevated prostate specific antigen [PSA]: Secondary | ICD-10-CM | POA: Diagnosis not present

## 2021-10-16 DIAGNOSIS — R9721 Rising PSA following treatment for malignant neoplasm of prostate: Secondary | ICD-10-CM

## 2021-10-16 LAB — URINALYSIS, ROUTINE W REFLEX MICROSCOPIC
Bilirubin, UA: NEGATIVE
Glucose, UA: NEGATIVE
Ketones, UA: NEGATIVE
Leukocytes,UA: NEGATIVE
Nitrite, UA: NEGATIVE
RBC, UA: NEGATIVE
Specific Gravity, UA: >1.03 — ABNORMAL HIGH (ref 1.005–1.030)
Urobilinogen, Ur: 0.2 mg/dL (ref 0.2–1.0)
pH, UA: 5.5 (ref 5.0–7.5)

## 2021-10-16 LAB — MICROSCOPIC EXAMINATION
Bacteria, UA: NONE SEEN
Epithelial Cells (non renal): NONE SEEN /hpf (ref 0–10)
RBC, Urine: NONE SEEN /hpf (ref 0–2)
Renal Epithel, UA: NONE SEEN /hpf
WBC, UA: NONE SEEN /hpf (ref 0–5)
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
## 2021-10-16 NOTE — Patient Instructions (Signed)
FU with Dr. Jeffie Pollock in 6 months. Needs PSA prior to OV

## 2021-10-16 NOTE — Progress Notes (Signed)
Urological Symptom Review  Patient is experiencing the following symptoms: none   Review of Systems  Gastrointestinal (upper)  : Negative for upper GI symptoms  Gastrointestinal (lower) : Negative for lower GI symptoms  Constitutional : Negative for symptoms  Skin: Negative for skin symptoms  Eyes: Negative for eye symptoms  Ear/Nose/Throat : Negative for Ear/Nose/Throat symptoms  Hematologic/Lymphatic: Negative for Hematologic/Lymphatic symptoms  Cardiovascular : Negative for cardiovascular symptoms  Respiratory : Negative for respiratory symptoms  Endocrine: Negative for endocrine symptoms  Musculoskeletal: Joint pain  Neurological: Negative for neurological symptoms  Psychologic: Negative for psychiatric symptoms

## 2021-10-23 ENCOUNTER — Ambulatory Visit: Payer: 59 | Admitting: Orthopedic Surgery

## 2021-11-03 ENCOUNTER — Other Ambulatory Visit: Payer: Self-pay

## 2021-11-04 ENCOUNTER — Other Ambulatory Visit (HOSPITAL_COMMUNITY): Payer: Self-pay

## 2021-11-04 ENCOUNTER — Other Ambulatory Visit: Payer: Self-pay

## 2021-11-06 ENCOUNTER — Ambulatory Visit: Payer: 59 | Admitting: Urology

## 2021-11-10 ENCOUNTER — Other Ambulatory Visit: Payer: Self-pay

## 2021-11-10 ENCOUNTER — Other Ambulatory Visit (HOSPITAL_COMMUNITY): Payer: Self-pay

## 2021-11-10 ENCOUNTER — Encounter: Payer: Self-pay | Admitting: Orthopedic Surgery

## 2021-11-10 ENCOUNTER — Ambulatory Visit (INDEPENDENT_AMBULATORY_CARE_PROVIDER_SITE_OTHER): Payer: 59 | Admitting: Orthopedic Surgery

## 2021-11-10 DIAGNOSIS — M79671 Pain in right foot: Secondary | ICD-10-CM

## 2021-11-10 DIAGNOSIS — M19079 Primary osteoarthritis, unspecified ankle and foot: Secondary | ICD-10-CM | POA: Diagnosis not present

## 2021-11-10 DIAGNOSIS — M79672 Pain in left foot: Secondary | ICD-10-CM | POA: Diagnosis not present

## 2021-11-10 MED ORDER — CELECOXIB 100 MG PO CAPS
100.0000 mg | ORAL_CAPSULE | Freq: Two times a day (BID) | ORAL | 0 refills | Status: DC
  Filled 2021-11-10: qty 30, 15d supply, fill #0

## 2021-11-10 NOTE — Progress Notes (Signed)
Encounter Diagnoses  Name Primary?   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     Chief Complaint  Patient presents with   Foot Pain    Bilateral, not any better, can't take anti inflammatory makes him sick, couldn't afford [facility]      This is a follow-up visit for Georg he has bilateral foot pain bilateral midfoot arthritis was not able to get the orthotics and take the anti-inflammatory medications in fact he has upset stomach when he takes diclofenac or meloxicam so basically he has not had any treatment  His x-rays show midfoot arthritis with some fragmentation of the talonavicular joint involving the talus  Recommend that we try Celebrex and try to get the orthotics from [facility] orthotics were about $600 and he cannot afford that  Follow-up in 6 to 7 weeks  Meds ordered this encounter  Medications   celecoxib (CELEBREX) 100 MG capsule    Sig: Take 1 capsule (100 mg total) by mouth 2 (two) times daily.    Dispense:  30 capsule    Refill:  0

## 2021-11-11 ENCOUNTER — Other Ambulatory Visit (HOSPITAL_COMMUNITY): Payer: Self-pay

## 2021-11-17 ENCOUNTER — Other Ambulatory Visit (HOSPITAL_COMMUNITY): Payer: Self-pay

## 2021-11-20 ENCOUNTER — Other Ambulatory Visit (HOSPITAL_COMMUNITY): Payer: Self-pay

## 2021-11-26 ENCOUNTER — Other Ambulatory Visit: Payer: Self-pay

## 2021-11-26 ENCOUNTER — Ambulatory Visit: Payer: 59

## 2021-12-04 ENCOUNTER — Ambulatory Visit: Payer: 59

## 2021-12-04 ENCOUNTER — Other Ambulatory Visit: Payer: Self-pay

## 2021-12-04 DIAGNOSIS — G4733 Obstructive sleep apnea (adult) (pediatric): Secondary | ICD-10-CM | POA: Diagnosis not present

## 2021-12-08 ENCOUNTER — Telehealth: Payer: Self-pay | Admitting: Pulmonary Disease

## 2021-12-08 DIAGNOSIS — G4733 Obstructive sleep apnea (adult) (pediatric): Secondary | ICD-10-CM

## 2021-12-08 NOTE — Telephone Encounter (Signed)
Called and went over HST results with patient. He voiced understanding. Answered his questions regarding how CPAP machine helps with OSA and the process for receiving CPAP machine. Order placed. Patient aware to call office and set up 6 week f/u once he receives CPAP machine. Nothing further needed.

## 2021-12-08 NOTE — Telephone Encounter (Signed)
HST showed mod  OSA with AHI 25/ hr Suggest autoCPAP  5-20 cm, mask of choice OV with me in 6 wks after starting CPAP

## 2021-12-10 ENCOUNTER — Other Ambulatory Visit: Payer: Self-pay | Admitting: Family Medicine

## 2021-12-10 ENCOUNTER — Other Ambulatory Visit (HOSPITAL_COMMUNITY): Payer: Self-pay

## 2021-12-10 MED ORDER — AMLODIPINE BESYLATE 5 MG PO TABS
5.0000 mg | ORAL_TABLET | Freq: Every day | ORAL | 0 refills | Status: DC
  Filled 2021-12-10 – 2021-12-30 (×2): qty 90, 90d supply, fill #0

## 2021-12-11 ENCOUNTER — Other Ambulatory Visit (HOSPITAL_COMMUNITY): Payer: Self-pay

## 2021-12-30 ENCOUNTER — Other Ambulatory Visit (HOSPITAL_COMMUNITY): Payer: Self-pay

## 2022-01-05 ENCOUNTER — Ambulatory Visit: Payer: 59 | Admitting: Orthopedic Surgery

## 2022-01-22 ENCOUNTER — Ambulatory Visit: Payer: 59 | Admitting: Orthopedic Surgery

## 2022-01-22 ENCOUNTER — Encounter: Payer: 59 | Admitting: Family Medicine

## 2022-02-13 ENCOUNTER — Other Ambulatory Visit: Payer: Self-pay | Admitting: Family Medicine

## 2022-02-13 ENCOUNTER — Ambulatory Visit: Payer: 59 | Admitting: Orthopedic Surgery

## 2022-02-13 ENCOUNTER — Other Ambulatory Visit: Payer: Self-pay | Admitting: Orthopedic Surgery

## 2022-02-16 ENCOUNTER — Other Ambulatory Visit (HOSPITAL_COMMUNITY): Payer: Self-pay

## 2022-02-16 MED ORDER — PANTOPRAZOLE SODIUM 40 MG PO TBEC
40.0000 mg | DELAYED_RELEASE_TABLET | Freq: Every day | ORAL | 1 refills | Status: DC
  Filled 2022-02-16 – 2022-03-11 (×2): qty 90, 90d supply, fill #0
  Filled 2022-06-11: qty 90, 90d supply, fill #1

## 2022-02-16 MED ORDER — CELECOXIB 100 MG PO CAPS
100.0000 mg | ORAL_CAPSULE | Freq: Two times a day (BID) | ORAL | 0 refills | Status: DC
Start: 2022-02-16 — End: 2022-06-11
  Filled 2022-02-16 – 2022-03-11 (×2): qty 30, 15d supply, fill #0

## 2022-02-18 ENCOUNTER — Other Ambulatory Visit (HOSPITAL_COMMUNITY): Payer: Self-pay

## 2022-02-19 ENCOUNTER — Encounter: Payer: Self-pay | Admitting: Orthopedic Surgery

## 2022-02-19 ENCOUNTER — Other Ambulatory Visit (HOSPITAL_COMMUNITY): Payer: Self-pay

## 2022-02-19 ENCOUNTER — Ambulatory Visit (INDEPENDENT_AMBULATORY_CARE_PROVIDER_SITE_OTHER): Payer: 59 | Admitting: Orthopedic Surgery

## 2022-02-19 VITALS — Ht 66.0 in | Wt 235.0 lb

## 2022-02-19 DIAGNOSIS — M19079 Primary osteoarthritis, unspecified ankle and foot: Secondary | ICD-10-CM | POA: Diagnosis not present

## 2022-02-19 NOTE — Patient Instructions (Signed)
Continue Celebrex ? ?Call us for refills ? ?Wear the crocs as much as you can ?

## 2022-02-19 NOTE — Progress Notes (Signed)
FOLLOW UP  ? ?Encounter Diagnosis  ?Name Primary?  ? 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 ? ? ?Chief Complaint  ?Patient presents with  ? Foot Pain  ?  Bilat, pt states pain has been better while wearing Crocs, but inserts ordered didn't help.   ? ? ? ?Anthony Erickson has bilateral foot pain he has bilateral midfoot arthritis ? ?He was able to get some over-the-counter inserts he could not afford the more expensive inserts from [facility] ? ?He is on Celebrex for pain along with Tylenol ? ?He says that the crocs helped but the inserts ordered did not help ? ?Discussing this with him there is not much else I know what to do in terms of midfoot arthritis I offered him an appointment with the foot and ankle specialist to discuss further and he wanted to   continue with the crocs and the medication ? ?In the standing position he has a decreased arch but he has a good tiptoe test for reconstituting the arch in his foot.  He has tenderness across the midfoot in both feet ? ?Follow-up as needed continue Celebrex and soft shoe ?

## 2022-03-11 ENCOUNTER — Other Ambulatory Visit (HOSPITAL_COMMUNITY): Payer: Self-pay

## 2022-03-12 ENCOUNTER — Encounter: Payer: 59 | Admitting: Family Medicine

## 2022-04-01 DIAGNOSIS — E785 Hyperlipidemia, unspecified: Secondary | ICD-10-CM | POA: Diagnosis not present

## 2022-04-01 DIAGNOSIS — E559 Vitamin D deficiency, unspecified: Secondary | ICD-10-CM | POA: Diagnosis not present

## 2022-04-02 ENCOUNTER — Ambulatory Visit (INDEPENDENT_AMBULATORY_CARE_PROVIDER_SITE_OTHER): Payer: 59 | Admitting: Family Medicine

## 2022-04-02 ENCOUNTER — Encounter: Payer: Self-pay | Admitting: Family Medicine

## 2022-04-02 ENCOUNTER — Other Ambulatory Visit (HOSPITAL_COMMUNITY): Payer: Self-pay

## 2022-04-02 VITALS — BP 130/80 | HR 94 | Ht 66.0 in | Wt 244.0 lb

## 2022-04-02 DIAGNOSIS — R7302 Impaired glucose tolerance (oral): Secondary | ICD-10-CM | POA: Diagnosis not present

## 2022-04-02 DIAGNOSIS — Z0000 Encounter for general adult medical examination without abnormal findings: Secondary | ICD-10-CM | POA: Diagnosis not present

## 2022-04-02 LAB — CMP14+EGFR
ALT: 26 IU/L (ref 0–44)
AST: 22 IU/L (ref 0–40)
Albumin/Globulin Ratio: 1.7 (ref 1.2–2.2)
Albumin: 4.4 g/dL (ref 3.8–4.9)
Alkaline Phosphatase: 99 IU/L (ref 44–121)
BUN/Creatinine Ratio: 15 (ref 10–24)
BUN: 15 mg/dL (ref 8–27)
Bilirubin Total: 0.6 mg/dL (ref 0.0–1.2)
CO2: 24 mmol/L (ref 20–29)
Calcium: 9.2 mg/dL (ref 8.6–10.2)
Chloride: 103 mmol/L (ref 96–106)
Creatinine, Ser: 1 mg/dL (ref 0.76–1.27)
Globulin, Total: 2.6 g/dL (ref 1.5–4.5)
Glucose: 115 mg/dL — ABNORMAL HIGH (ref 70–99)
Potassium: 4.2 mmol/L (ref 3.5–5.2)
Sodium: 140 mmol/L (ref 134–144)
Total Protein: 7 g/dL (ref 6.0–8.5)
eGFR: 86 mL/min/{1.73_m2} (ref >59)

## 2022-04-02 LAB — LIPID PANEL
Chol/HDL Ratio: 4.1 ratio (ref 0.0–5.0)
Cholesterol, Total: 158 mg/dL (ref 100–199)
HDL: 39 mg/dL — ABNORMAL LOW (ref >39)
LDL Chol Calc (NIH): 101 mg/dL — ABNORMAL HIGH (ref 0–99)
Triglycerides: 95 mg/dL (ref 0–149)
VLDL Cholesterol Cal: 18 mg/dL (ref 5–40)

## 2022-04-02 LAB — VITAMIN D 25 HYDROXY (VIT D DEFICIENCY, FRACTURES): Vit D, 25-Hydroxy: 17.5 ng/mL — ABNORMAL LOW (ref 30.0–100.0)

## 2022-04-02 LAB — POCT GLYCOSYLATED HEMOGLOBIN (HGB A1C): HbA1c, POC (prediabetic range): 5.4 % — AB (ref 5.7–6.4)

## 2022-04-02 MED ORDER — WEGOVY 0.5 MG/0.5ML ~~LOC~~ SOAJ
0.5000 mg | SUBCUTANEOUS | 1 refills | Status: DC
  Filled 2022-04-02: qty 2, fill #0

## 2022-04-02 MED ORDER — WEGOVY 0.25 MG/0.5ML ~~LOC~~ SOAJ
0.2500 mg | SUBCUTANEOUS | 0 refills | Status: DC
  Filled 2022-04-02: qty 2, fill #0

## 2022-04-02 MED ORDER — ERGOCALCIFEROL 1.25 MG (50000 UT) PO CAPS
50000.0000 [IU] | ORAL_CAPSULE | ORAL | 1 refills | Status: DC
  Filled 2022-04-02: qty 12, 84d supply, fill #0

## 2022-04-02 MED ORDER — BUSPIRONE HCL 5 MG PO TABS
5.0000 mg | ORAL_TABLET | Freq: Two times a day (BID) | ORAL | 3 refills | Status: DC
  Filled 2022-04-02: qty 180, 90d supply, fill #0

## 2022-04-02 MED ORDER — WEGOVY 0.25 MG/0.5ML ~~LOC~~ SOAJ
0.2500 mg | SUBCUTANEOUS | 0 refills | Status: DC
  Filled 2022-04-02: qty 2, 28d supply, fill #0

## 2022-04-02 NOTE — Patient Instructions (Addendum)
F/U in 10 weeks , call if you need me sooner  Will be in touch re weight loss medication once pharmacy lets me know  Pain in your arm is from  arthritis in your neck, celebrex and tylenol will address this  New is once weekly vit D for at least 6 months, will recheck in December  It is important that you exercise regularly at least 30 minutes 5 times a week. If you develop chest pain, have severe difficulty breathing, or feel very tired, stop exercising immediately and seek medical attention    Think about what you will eat, plan ahead. Choose " clean, green, fresh or frozen" over canned, processed or packaged foods which are more sugary, salty and fatty. 70 to 75% of food eaten should be vegetables and fruit. Three meals at set times with snacks allowed between meals, but they must be fruit or vegetables. Aim to eat over a 12 hour period , example 7 am to 7 pm, and STOP after  your last meal of the day. Drink water,generally about 64 ounces per day, no other drink is as healthy. Fruit juice is best enjoyed in a healthy way, by EATING the fruit.  Thanks for choosing [facility], we consider it a privelige to serve you.

## 2022-04-02 NOTE — Assessment & Plan Note (Signed)
Glyco hB in office today

## 2022-04-02 NOTE — Progress Notes (Signed)
   Anthony Erickson     MRN: [id number]      DOB: 05-15-1961   HPI: Patient is in for annual physical exam. 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 Immunization is reviewed , and  updated if needed.    PE; BP 130/80   Pulse 94   Ht '5\' 6"'$  (1.676 m)   Wt 244 lb (110.7 kg)   SpO2 94%   BMI 39.38 kg/m   Pleasant male, alert and oriented x 3, in no cardio-pulmonary distress. Afebrile. HEENT No facial trauma or asymetry. Sinuses non tender. EOMI External ears normal,  Neck: supple, no adenopathy,JVD or thyromegaly.No bruits.  Chest: Clear to ascultation bilaterally.No crackles or wheezes. Non tender to palpation  Cardiovascular system; Heart sounds normal,  S1 and  S2 ,no S3.  No murmur, or thrill. Apical beat not displaced Peripheral pulses normal.  Abdomen: Soft, non tender, no organomegaly or masses. No bruits. Bowel sounds normal. No guarding, tenderness or rebound.    Musculoskeletal exam: Full ROM of spine, hips , shoulders and knees. No deformity ,swelling or crepitus noted. No muscle wasting or atrophy.   Neurologic: Cranial nerves 2 to 12 intact. Power, tone ,sensation and reflexes normal throughout. No disturbance in gait. No tremor.  Skin: Intact, no ulceration, erythema , scaling or rash noted. Pigmentation normal throughout  Psych; Normal mood and affect. Judgement and concentration normal   Assessment & Plan:  IGT (impaired glucose tolerance) Glyco hB in office today  Annual physical exam Annual exam as documented. Counseling done  re healthy lifestyle involving commitment to 150 minutes exercise per week, heart healthy diet, and attaining healthy weight.The importance of  adequate sleep also discussed. Regular seat belt use and home safety, is also discussed. Changes in health habits are decided on by the patient with goals and time frames  set for achieving them. Immunization and cancer screening needs are specifically addressed at this visit.   Morbid obesity ([place])  Patient re-educated about  the importance of commitment to a  minimum of 150 minutes of exercise per week as able.  The importance of healthy food choices with portion control discussed, as well as eating regularly and within a 12 hour window most days. The need to choose "clean , green" food 50 to 75% of the time is discussed, as well as to make water the primary drink and set a goal of 64 ounces water daily.       04/02/2022    2:03 PM 02/19/2022    3:05 PM 09/11/2021    9:29 AM  Weight /BMI  Weight 244 lb 235 lb 235 lb 1.9 oz  Height '5\' 6"'$  (1.676 m) '5\' 6"'$  (1.676 m) '5\' 6"'$  (1.676 m)  BMI 39.38 kg/m2 37.93 kg/m2 37.95 kg/m2  Wegovy on back porder till September, wil rx phentermine cap x 2 months, low dose

## 2022-04-03 ENCOUNTER — Other Ambulatory Visit (HOSPITAL_COMMUNITY): Payer: Self-pay

## 2022-04-03 ENCOUNTER — Encounter: Payer: 59 | Admitting: Family Medicine

## 2022-04-04 ENCOUNTER — Other Ambulatory Visit: Payer: Self-pay | Admitting: Family Medicine

## 2022-04-05 ENCOUNTER — Telehealth: Payer: Self-pay | Admitting: Family Medicine

## 2022-04-05 ENCOUNTER — Encounter: Payer: Self-pay | Admitting: Family Medicine

## 2022-04-05 NOTE — Assessment & Plan Note (Signed)

## 2022-04-05 NOTE — Assessment & Plan Note (Signed)
  Patient re-educated about  the importance of commitment to a  minimum of 150 minutes of exercise per week as able.  The importance of healthy food choices with portion control discussed, as well as eating regularly and within a 12 hour window most days. The need to choose "clean , green" food 50 to 75% of the time is discussed, as well as to make water the primary drink and set a goal of 64 ounces water daily.       04/02/2022    2:03 PM 02/19/2022    3:05 PM 09/11/2021    9:29 AM  Weight /BMI  Weight 244 lb 235 lb 235 lb 1.9 oz  Height '5\' 6"'$  (1.676 m) '5\' 6"'$  (1.676 m) '5\' 6"'$  (1.676 m)  BMI 39.38 kg/m2 37.93 kg/m2 37.95 kg/m2  Wegovy on back porder till September, wil rx phentermine cap x 2 months, low dose

## 2022-04-05 NOTE — Telephone Encounter (Signed)
Hi Brandi, pls contact pt, since wegovy on back order till Sept.I recommend phentermine 10 mg one daily for 3 months, hen the  wegovy in Sept when available. I have entered historically, let me know if he agrees , I will send electronically, thanks

## 2022-04-05 NOTE — Telephone Encounter (Signed)
Please refill amlodipine 5 mg daily 90 day supply with 3 additional refills, thank you

## 2022-04-06 ENCOUNTER — Other Ambulatory Visit (HOSPITAL_COMMUNITY): Payer: Self-pay

## 2022-04-06 ENCOUNTER — Telehealth: Payer: Self-pay | Admitting: Family Medicine

## 2022-04-06 NOTE — Telephone Encounter (Signed)
Pt called back about medication

## 2022-04-07 ENCOUNTER — Other Ambulatory Visit: Payer: Self-pay | Admitting: Family Medicine

## 2022-04-07 ENCOUNTER — Telehealth: Payer: Self-pay

## 2022-04-07 ENCOUNTER — Other Ambulatory Visit: Payer: Self-pay

## 2022-04-07 ENCOUNTER — Other Ambulatory Visit (HOSPITAL_COMMUNITY): Payer: Self-pay

## 2022-04-07 MED ORDER — AMLODIPINE BESYLATE 5 MG PO TABS
5.0000 mg | ORAL_TABLET | Freq: Every day | ORAL | 0 refills | Status: DC
  Filled 2022-04-07: qty 90, 90d supply, fill #0

## 2022-04-07 NOTE — Telephone Encounter (Signed)
Pt aware and wants to do it on his own

## 2022-04-07 NOTE — Telephone Encounter (Signed)
See previous message

## 2022-04-07 NOTE — Telephone Encounter (Signed)
Patient called need med refill   Rx #: [id number]  amLODipine (NORVASC) 5 MG tablet   [facility]  [address], [place] [address]  Phone:  [phone]  Fax:  [phone]  DEA #:  [id number]

## 2022-04-07 NOTE — Telephone Encounter (Signed)
Refilled

## 2022-04-09 ENCOUNTER — Other Ambulatory Visit: Payer: 59

## 2022-04-09 ENCOUNTER — Other Ambulatory Visit: Payer: Self-pay | Admitting: *Deleted

## 2022-04-09 DIAGNOSIS — Z8546 Personal history of malignant neoplasm of prostate: Secondary | ICD-10-CM | POA: Diagnosis not present

## 2022-04-10 ENCOUNTER — Other Ambulatory Visit: Payer: Self-pay | Admitting: Family Medicine

## 2022-04-10 ENCOUNTER — Telehealth: Payer: Self-pay | Admitting: Family Medicine

## 2022-04-10 ENCOUNTER — Other Ambulatory Visit (HOSPITAL_COMMUNITY): Payer: Self-pay

## 2022-04-10 LAB — PSA: Prostate Specific Ag, Serum: 0.5 ng/mL (ref 0.0–4.0)

## 2022-04-10 MED ORDER — PHENTERMINE HCL 15 MG PO CAPS
15.0000 mg | ORAL_CAPSULE | ORAL | 2 refills | Status: DC
  Filled 2022-04-10: qty 30, 30d supply, fill #0

## 2022-04-10 NOTE — Telephone Encounter (Signed)
Patient called in regard to discussion tele messages on 6/4.  Patient wants to start with original plan of   phentermine 10 mg one daily for 3 months, then the  wegovy in Sept when available.

## 2022-04-14 ENCOUNTER — Other Ambulatory Visit (HOSPITAL_COMMUNITY): Payer: Self-pay

## 2022-04-16 ENCOUNTER — Ambulatory Visit (INDEPENDENT_AMBULATORY_CARE_PROVIDER_SITE_OTHER): Payer: 59 | Admitting: Urology

## 2022-04-16 VITALS — BP 108/69 | HR 99

## 2022-04-16 DIAGNOSIS — R9721 Rising PSA following treatment for malignant neoplasm of prostate: Secondary | ICD-10-CM

## 2022-04-16 DIAGNOSIS — Z8546 Personal history of malignant neoplasm of prostate: Secondary | ICD-10-CM | POA: Diagnosis not present
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
## 2022-04-17 ENCOUNTER — Encounter: Payer: Self-pay | Admitting: Urology

## 2022-04-17 LAB — URINALYSIS, ROUTINE W REFLEX MICROSCOPIC
Bilirubin, UA: NEGATIVE
Glucose, UA: NEGATIVE
Ketones, UA: NEGATIVE
Leukocytes,UA: NEGATIVE
Nitrite, UA: NEGATIVE
Protein,UA: NEGATIVE
Specific Gravity, UA: 1.01 (ref 1.005–1.030)
Urobilinogen, Ur: 1 mg/dL (ref 0.2–1.0)
pH, UA: 6.5 (ref 5.0–7.5)

## 2022-04-17 LAB — MICROSCOPIC EXAMINATION
Bacteria, UA: NONE SEEN
Epithelial Cells (non renal): NONE SEEN /hpf (ref 0–10)
WBC, UA: NONE SEEN /hpf (ref 0–5)

## 2022-05-04 DIAGNOSIS — H52203 Unspecified astigmatism, bilateral: Secondary | ICD-10-CM | POA: Diagnosis not present

## 2022-05-04 DIAGNOSIS — H5213 Myopia, bilateral: Secondary | ICD-10-CM | POA: Diagnosis not present

## 2022-05-04 DIAGNOSIS — H25813 Combined forms of age-related cataract, bilateral: Secondary | ICD-10-CM | POA: Diagnosis not present

## 2022-05-04 DIAGNOSIS — H524 Presbyopia: Secondary | ICD-10-CM | POA: Diagnosis not present

## 2022-06-04 ENCOUNTER — Other Ambulatory Visit: Payer: Self-pay | Admitting: Family Medicine

## 2022-06-04 ENCOUNTER — Other Ambulatory Visit (HOSPITAL_COMMUNITY): Payer: Self-pay

## 2022-06-04 MED ORDER — AMLODIPINE BESYLATE 5 MG PO TABS
5.0000 mg | ORAL_TABLET | Freq: Every day | ORAL | 0 refills | Status: DC
  Filled 2022-06-04 – 2022-07-02 (×3): qty 90, 90d supply, fill #0

## 2022-06-11 ENCOUNTER — Other Ambulatory Visit (HOSPITAL_COMMUNITY): Payer: Self-pay

## 2022-06-11 ENCOUNTER — Encounter: Payer: Self-pay | Admitting: Family Medicine

## 2022-06-11 ENCOUNTER — Ambulatory Visit: Payer: 59 | Admitting: Family Medicine

## 2022-06-11 VITALS — BP 128/72 | HR 85 | Ht 66.0 in | Wt 233.1 lb

## 2022-06-11 DIAGNOSIS — E785 Hyperlipidemia, unspecified: Secondary | ICD-10-CM | POA: Diagnosis not present

## 2022-06-11 DIAGNOSIS — E559 Vitamin D deficiency, unspecified: Secondary | ICD-10-CM

## 2022-06-11 DIAGNOSIS — I10 Essential (primary) hypertension: Secondary | ICD-10-CM | POA: Diagnosis not present

## 2022-06-11 DIAGNOSIS — K219 Gastro-esophageal reflux disease without esophagitis: Secondary | ICD-10-CM | POA: Diagnosis not present

## 2022-06-11 DIAGNOSIS — G4719 Other hypersomnia: Secondary | ICD-10-CM | POA: Diagnosis not present

## 2022-06-11 MED ORDER — ERGOCALCIFEROL 1.25 MG (50000 UT) PO CAPS
50000.0000 [IU] | ORAL_CAPSULE | ORAL | 2 refills | Status: DC
  Filled 2022-06-11: qty 12, 84d supply, fill #0

## 2022-06-11 NOTE — Patient Instructions (Signed)
F/U  first week in  December, call if you need me before  Fasting lipid, CBC, chem 7 and EGFr, TSH and vit D 3 days before visit  CONGRATS, food choice and portion control make  ALL the difference   It is important that you exercise regularly at least 30 minutes 5 times a week. If you develop chest pain, have severe difficulty breathing, or feel very tired, stop exercising immediately and seek medical attention   Think about what you will eat, plan ahead. Choose " clean, green, fresh or frozen" over canned, processed or packaged foods which are more sugary, salty and fatty. 70 to 75% of food eaten should be vegetables and fruit. Three meals at set times with snacks allowed between meals, but they must be fruit or vegetables. Aim to eat over a 12 hour period , example 7 am to 7 pm, and STOP after  your last meal of the day. Drink water,generally about 64 ounces per day, no other drink is as healthy. Fruit juice is best enjoyed in a healthy way, by EATING the fruit. Thanks for choosing [facility], we consider it a privelige to serve you.

## 2022-06-11 NOTE — Progress Notes (Signed)
Anthony Erickson     MRN: [id number]      DOB: September 09, 1961   HPI Anthony Erickson is here for follow up and re-evaluation of chronic medical conditions, medication management and review of any available recent lab and radiology data.  Preventive health is updated, specifically  Cancer screening and Immunization.   There are no new concerns.  Stopped phentermine,  caused constipation, losing weight with change in diet and exercise, feels better  ROS Denies recent fever or chills. Denies sinus pressure, nasal congestion, ear pain or sore throat. Denies chest congestion, productive cough or wheezing. Denies chest pains, palpitations and leg swelling Denies abdominal pain, nausea, vomiting,diarrhea or constipation.   Denies dysuria, frequency, hesitancy or incontinence. Denies uncontrolled  joint pain, swelling and limitation in mobility. Denies headaches, seizures, numbness, or tingling. Denies depression, anxiety or insomnia. Denies skin break down or rash.   PE  BP 128/72 (BP Location: Right Arm, Patient Position: Sitting, Cuff Size: Large)   Pulse 85   Ht '5\' 6"'$  (1.676 m)   Wt 233 lb 1.9 oz (105.7 kg)   SpO2 95%   BMI 37.63 kg/m   Patient alert and oriented and in no cardiopulmonary distress.  HEENT: No facial asymmetry, EOMI,     Neck supple .  Chest: Clear to auscultation bilaterally.  CVS: S1, S2 no murmurs, no S3.Regular rate.  ABD: Soft non tender.   Ext: No edema  MS: Adequate ROM spine, shoulders, hips and knees.  Skin: Intact, no ulcerations or rash noted.  Psych: Good eye contact, normal affect. Memory intact not anxious or depressed appearing.  CNS: CN 2-12 intact, power,  normal throughout.no focal deficits noted.   Assessment & Plan  Essential hypertension Controlled, no change in medication DASH diet and commitment to daily physical activity for a minimum of 30 minutes discussed and encouraged, as a part of hypertension management. The importance of  attaining a healthy weight is also discussed.     06/11/2022    1:25 PM 04/16/2022    2:39 PM 04/02/2022    2:48 PM 04/02/2022    2:03 PM 02/19/2022    3:05 PM 10/16/2021    2:43 PM 09/11/2021    9:29 AM  BP/Weight  Systolic BP 938 101 751 025  852 778  Diastolic BP 72 69 80 81  79 80  Wt. (Lbs) 233.12   244 235  235.12  BMI 37.63 kg/m2   39.38 kg/m2 37.93 kg/m2  37.95 kg/m2       GERD Controlled, no change in medication   Dyslipidemia Hyperlipidemia:Low fat diet discussed and encouraged.   Lipid Panel  Lab Results  Component Value Date   CHOL 158 04/01/2022   HDL 39 (L) 04/01/2022   LDLCALC 101 (H) 04/01/2022   TRIG 95 04/01/2022   CHOLHDL 4.1 04/01/2022     Updated lab needed at/ before next visit.   Morbid obesity ([place])  Patient re-educated about  the importance of commitment to a  minimum of 150 minutes of exercise per week as able.  The importance of healthy food choices with portion control discussed, as well as eating regularly and within a 12 hour window most days. The need to choose "clean , green" food 50 to 75% of the time is discussed, as well as to make water the primary drink and set a goal of 64 ounces water daily.       06/11/2022    1:25 PM 04/02/2022    2:03 PM  02/19/2022    3:05 PM  Weight /BMI  Weight 233 lb 1.9 oz 244 lb 235 lb  Height '5\' 6"'$  (1.676 m) '5\' 6"'$  (1.676 m) '5\' 6"'$  (1.676 m)  BMI 37.63 kg/m2 39.38 kg/m2 37.93 kg/m2  improved

## 2022-06-11 NOTE — Assessment & Plan Note (Signed)
  Patient re-educated about  the importance of commitment to a  minimum of 150 minutes of exercise per week as able.  The importance of healthy food choices with portion control discussed, as well as eating regularly and within a 12 hour window most days. The need to choose "clean , green" food 50 to 75% of the time is discussed, as well as to make water the primary drink and set a goal of 64 ounces water daily.       06/11/2022    1:25 PM 04/02/2022    2:03 PM 02/19/2022    3:05 PM  Weight /BMI  Weight 233 lb 1.9 oz 244 lb 235 lb  Height '5\' 6"'$  (1.676 m) '5\' 6"'$  (1.676 m) '5\' 6"'$  (1.676 m)  BMI 37.63 kg/m2 39.38 kg/m2 37.93 kg/m2  improved

## 2022-06-11 NOTE — Assessment & Plan Note (Signed)
Controlled, no change in medication  

## 2022-06-11 NOTE — Assessment & Plan Note (Signed)
Controlled, no change in medication DASH diet and commitment to daily physical activity for a minimum of 30 minutes discussed and encouraged, as a part of hypertension management. The importance of attaining a healthy weight is also discussed.     06/11/2022    1:25 PM 04/16/2022    2:39 PM 04/02/2022    2:48 PM 04/02/2022    2:03 PM 02/19/2022    3:05 PM 10/16/2021    2:43 PM 09/11/2021    9:29 AM  BP/Weight  Systolic BP 165 800 634 949  447 395  Diastolic BP 72 69 80 81  79 80  Wt. (Lbs) 233.12   244 235  235.12  BMI 37.63 kg/m2   39.38 kg/m2 37.93 kg/m2  37.95 kg/m2

## 2022-06-11 NOTE — Assessment & Plan Note (Signed)
Hyperlipidemia:Low fat diet discussed and encouraged.   Lipid Panel  Lab Results  Component Value Date   CHOL 158 04/01/2022   HDL 39 (L) 04/01/2022   LDLCALC 101 (H) 04/01/2022   TRIG 95 04/01/2022   CHOLHDL 4.1 04/01/2022     Updated lab needed at/ before next visit.

## 2022-06-12 ENCOUNTER — Other Ambulatory Visit (HOSPITAL_COMMUNITY): Payer: Self-pay

## 2022-07-02 ENCOUNTER — Other Ambulatory Visit: Payer: Self-pay | Admitting: Family Medicine

## 2022-07-02 ENCOUNTER — Other Ambulatory Visit (HOSPITAL_COMMUNITY): Payer: Self-pay

## 2022-07-02 MED ORDER — AMLODIPINE BESYLATE 5 MG PO TABS
5.0000 mg | ORAL_TABLET | Freq: Every day | ORAL | 0 refills | Status: DC
  Filled 2022-07-02 – 2022-11-06 (×3): qty 90, 90d supply, fill #0

## 2022-07-03 ENCOUNTER — Other Ambulatory Visit (HOSPITAL_COMMUNITY): Payer: Self-pay

## 2022-10-01 DIAGNOSIS — E785 Hyperlipidemia, unspecified: Secondary | ICD-10-CM | POA: Diagnosis not present

## 2022-10-01 DIAGNOSIS — E559 Vitamin D deficiency, unspecified: Secondary | ICD-10-CM | POA: Diagnosis not present

## 2022-10-01 DIAGNOSIS — R7302 Impaired glucose tolerance (oral): Secondary | ICD-10-CM | POA: Diagnosis not present

## 2022-10-01 DIAGNOSIS — I10 Essential (primary) hypertension: Secondary | ICD-10-CM | POA: Diagnosis not present

## 2022-10-02 ENCOUNTER — Other Ambulatory Visit: Payer: Self-pay

## 2022-10-02 DIAGNOSIS — R7302 Impaired glucose tolerance (oral): Secondary | ICD-10-CM

## 2022-10-02 LAB — LIPID PANEL
Chol/HDL Ratio: 4.1 ratio (ref 0.0–5.0)
Cholesterol, Total: 163 mg/dL (ref 100–199)
HDL: 40 mg/dL (ref >39)
LDL Chol Calc (NIH): 109 mg/dL — ABNORMAL HIGH (ref 0–99)
Triglycerides: 71 mg/dL (ref 0–149)
VLDL Cholesterol Cal: 14 mg/dL (ref 5–40)

## 2022-10-02 LAB — TSH: TSH: 1.4 u[IU]/mL (ref 0.450–4.500)

## 2022-10-02 LAB — CBC
Hematocrit: 44.7 % (ref 37.5–51.0)
Hemoglobin: 14.7 g/dL (ref 13.0–17.7)
MCH: 31.3 pg (ref 26.6–33.0)
MCHC: 32.9 g/dL (ref 31.5–35.7)
MCV: 95 fL (ref 79–97)
Platelets: 211 10*3/uL (ref 150–450)
RBC: 4.69 x10E6/uL (ref 4.14–5.80)
RDW: 11.9 % (ref 11.6–15.4)
WBC: 5.7 10*3/uL (ref 3.4–10.8)

## 2022-10-02 LAB — BMP8+EGFR
BUN/Creatinine Ratio: 17 (ref 10–24)
BUN: 17 mg/dL (ref 8–27)
CO2: 22 mmol/L (ref 20–29)
Calcium: 9.2 mg/dL (ref 8.6–10.2)
Chloride: 106 mmol/L (ref 96–106)
Creatinine, Ser: 1 mg/dL (ref 0.76–1.27)
Glucose: 118 mg/dL — ABNORMAL HIGH (ref 70–99)
Potassium: 4.5 mmol/L (ref 3.5–5.2)
Sodium: 143 mmol/L (ref 134–144)
eGFR: 86 mL/min/{1.73_m2} (ref >59)

## 2022-10-02 LAB — VITAMIN D 25 HYDROXY (VIT D DEFICIENCY, FRACTURES): Vit D, 25-Hydroxy: 34.5 ng/mL (ref 30.0–100.0)

## 2022-10-03 LAB — SPECIMEN STATUS REPORT

## 2022-10-03 LAB — HGB A1C W/O EAG: Hgb A1c MFr Bld: 5.4 % (ref 4.8–5.6)

## 2022-10-06 ENCOUNTER — Encounter: Payer: Self-pay | Admitting: Family Medicine

## 2022-10-06 ENCOUNTER — Ambulatory Visit (INDEPENDENT_AMBULATORY_CARE_PROVIDER_SITE_OTHER): Payer: 59 | Admitting: Family Medicine

## 2022-10-06 VITALS — BP 120/80 | HR 89 | Ht 66.0 in | Wt 235.1 lb

## 2022-10-06 DIAGNOSIS — M79642 Pain in left hand: Secondary | ICD-10-CM | POA: Diagnosis not present

## 2022-10-06 DIAGNOSIS — K219 Gastro-esophageal reflux disease without esophagitis: Secondary | ICD-10-CM

## 2022-10-06 DIAGNOSIS — M65312 Trigger thumb, left thumb: Secondary | ICD-10-CM

## 2022-10-06 DIAGNOSIS — E559 Vitamin D deficiency, unspecified: Secondary | ICD-10-CM

## 2022-10-06 DIAGNOSIS — E785 Hyperlipidemia, unspecified: Secondary | ICD-10-CM

## 2022-10-06 DIAGNOSIS — I10 Essential (primary) hypertension: Secondary | ICD-10-CM | POA: Diagnosis not present

## 2022-10-06 NOTE — Progress Notes (Signed)
Anthony Erickson     MRN: [id number]      DOB: June 05, 1961   HPI Anthony Erickson is here for follow up and re-evaluation of chronic medical conditions, medication management and review of any available recent lab and radiology data.  Preventive health is updated, specifically  Cancer screening and Immunization.   Questions or concerns regarding consultations or procedures which the PT has had in the interim are  addressed. The PT denies any adverse reactions to current medications since the last visit.  2 month h/o intermittent left hand and thumb pain with trigeering up to a 10, no weakness or loss of sensation  Chronic bilateral foot pain limiting walking Will work on weight   ROS Denies recent fever or chills. Denies sinus pressure, nasal congestion, ear pain or sore throat. Denies chest congestion, productive cough or wheezing. Denies chest pains, palpitations and leg swelling Denies abdominal pain, nausea, vomiting,diarrhea or constipation.   Denies dysuria, frequency, hesitancy or incontinence.  Denies headaches, seizures, numbness, or tingling. Denies depression, anxiety or insomnia. Denies skin break down or rash.   PE  BP 134/82 (BP Location: Right Arm, Patient Position: Sitting, Cuff Size: Large)   Pulse 89   Ht '5\' 6"'$  (1.676 m)   Wt 235 lb 1.9 oz (106.6 kg)   SpO2 97%   BMI 37.95 kg/m   Patient alert and oriented and in no cardiopulmonary distress.  HEENT: No facial asymmetry, EOMI,     Neck supple .  Chest: Clear to auscultation bilaterally.  CVS: S1, S2 no murmurs, no S3.Regular rate.  ABD: Soft non tender.   Ext: No edema  MS: Adequate ROM spine, shoulders, hips and knees.left thenar wasting and left rigger thumb  Skin: Intact, no ulcerations or rash noted.  Psych: Good eye contact, normal affect. Memory intact not anxious or depressed appearing.  CNS: CN 2-12 intact, power,  normal throughout.no focal deficits noted.   Assessment & Plan  Trigger  thumb of left hand Left trigger thumb refer Ortho, symptomatic, uncontrolled pain at times  Left hand pain Refer ortho   Essential hypertension Controlled, no change in medication DASH diet and commitment to daily physical activity for a minimum of 30 minutes discussed and encouraged, as a part of hypertension management. The importance of attaining a healthy weight is also discussed.     10/06/2022    8:57 AM 10/06/2022    8:22 AM 06/11/2022    1:25 PM 04/16/2022    2:39 PM 04/02/2022    2:48 PM 04/02/2022    2:03 PM 02/19/2022    3:05 PM  BP/Weight  Systolic BP 144 818 563 149 702 637   Diastolic BP 80 82 72 69 80 81   Wt. (Lbs)  235.12 233.12   244 235  BMI  37.95 kg/m2 37.63 kg/m2   39.38 kg/m2 37.93 kg/m2       GERD Controlled, no change in medication   Morbid obesity ([place])  Patient re-educated about  the importance of commitment to a  minimum of 150 minutes of exercise per week as able.  The importance of healthy food choices with portion control discussed, as well as eating regularly and within a 12 hour window most days. The need to choose "clean , green" food 50 to 75% of the time is discussed, as well as to make water the primary drink and set a goal of 64 ounces water daily.       10/06/2022    8:22 AM  06/11/2022    1:25 PM 04/02/2022    2:03 PM  Weight /BMI  Weight 235 lb 1.9 oz 233 lb 1.9 oz 244 lb  Height '5\' 6"'$  (1.676 m) '5\' 6"'$  (1.676 m) '5\' 6"'$  (1.676 m)  BMI 37.95 kg/m2 37.63 kg/m2 39.38 kg/m2    Unchanged, weight loss goal of 12 pounds in 6 months

## 2022-10-06 NOTE — Assessment & Plan Note (Signed)
Refer ortho

## 2022-10-06 NOTE — Assessment & Plan Note (Signed)
Controlled, no change in medication  

## 2022-10-06 NOTE — Assessment & Plan Note (Addendum)
Left trigger thumb refer Ortho, symptomatic, uncontrolled pain at times

## 2022-10-06 NOTE — Assessment & Plan Note (Signed)
Controlled, no change in medication DASH diet and commitment to daily physical activity for a minimum of 30 minutes discussed and encouraged, as a part of hypertension management. The importance of attaining a healthy weight is also discussed.     10/06/2022    8:57 AM 10/06/2022    8:22 AM 06/11/2022    1:25 PM 04/16/2022    2:39 PM 04/02/2022    2:48 PM 04/02/2022    2:03 PM 02/19/2022    3:05 PM  BP/Weight  Systolic BP 967 227 737 505 107 125   Diastolic BP 80 82 72 69 80 81   Wt. (Lbs)  235.12 233.12   244 235  BMI  37.95 kg/m2 37.63 kg/m2   39.38 kg/m2 37.93 kg/m2

## 2022-10-06 NOTE — Assessment & Plan Note (Signed)
  Patient re-educated about  the importance of commitment to a  minimum of 150 minutes of exercise per week as able.  The importance of healthy food choices with portion control discussed, as well as eating regularly and within a 12 hour window most days. The need to choose "clean , green" food 50 to 75% of the time is discussed, as well as to make water the primary drink and set a goal of 64 ounces water daily.       10/06/2022    8:22 AM 06/11/2022    1:25 PM 04/02/2022    2:03 PM  Weight /BMI  Weight 235 lb 1.9 oz 233 lb 1.9 oz 244 lb  Height '5\' 6"'$  (1.676 m) '5\' 6"'$  (1.676 m) '5\' 6"'$  (1.676 m)  BMI 37.95 kg/m2 37.63 kg/m2 39.38 kg/m2    Unchanged, weight loss goal of 12 pounds in 6 months

## 2022-10-06 NOTE — Patient Instructions (Addendum)
Annual exam mid June  or after, call if you need me sooner  Fasting CBC, lipid, cmp and EGFr, tSH and vit D  last week in May  Weight loss goal of 12 pounds  Take vit D3 1000 IU daily  You are referred to Orthopedics re left trigger thumb and left hand pain  It is important that you exercise regularly at least 30 minutes 5 times a week. If you develop chest pain, have severe difficulty breathing, or feel very tired, stop exercising immediately and seek medical attention    Think about what you will eat, plan ahead. Choose " clean, green, fresh or frozen" over canned, processed or packaged foods which are more sugary, salty and fatty. 70 to 75% of food eaten should be vegetables and fruit. Three meals at set times with snacks allowed between meals, but they must be fruit or vegetables. Aim to eat over a 12 hour period , example 7 am to 7 pm, and STOP after  your last meal of the day. Drink water,generally about 64 ounces per day, no other drink is as healthy. Fruit juice is best enjoyed in a healthy way, by EATING the fruit.   Thanks for choosing [facility], we consider it a privelige to serve you.

## 2022-10-08 ENCOUNTER — Other Ambulatory Visit: Payer: 59

## 2022-10-08 DIAGNOSIS — R9721 Rising PSA following treatment for malignant neoplasm of prostate: Secondary | ICD-10-CM

## 2022-10-08 DIAGNOSIS — Z8546 Personal history of malignant neoplasm of prostate: Secondary | ICD-10-CM | POA: Diagnosis not present

## 2022-10-10 LAB — PSA: Prostate Specific Ag, Serum: <0.1 ng/mL (ref 0.0–4.0)

## 2022-10-15 ENCOUNTER — Encounter: Payer: Self-pay | Admitting: Urology

## 2022-10-15 ENCOUNTER — Ambulatory Visit (INDEPENDENT_AMBULATORY_CARE_PROVIDER_SITE_OTHER): Payer: 59 | Admitting: Urology

## 2022-10-15 VITALS — BP 148/79 | HR 97

## 2022-10-15 DIAGNOSIS — Z8546 Personal history of malignant neoplasm of prostate: Secondary | ICD-10-CM | POA: Diagnosis not present

## 2022-10-15 DIAGNOSIS — R9721 Rising PSA following treatment for malignant neoplasm of prostate: Secondary | ICD-10-CM

## 2022-10-15 DIAGNOSIS — R351 Nocturia: Secondary | ICD-10-CM | POA: Diagnosis not present

## 2022-10-15 LAB — URINALYSIS, ROUTINE W REFLEX MICROSCOPIC
Bilirubin, UA: NEGATIVE
Glucose, UA: NEGATIVE
Ketones, UA: NEGATIVE
Leukocytes,UA: NEGATIVE
Nitrite, UA: NEGATIVE
RBC, UA: NEGATIVE
Specific Gravity, UA: 1.02 (ref 1.005–1.030)
Urobilinogen, Ur: 2 mg/dL — ABNORMAL HIGH (ref 0.2–1.0)
pH, UA: 7 (ref 5.0–7.5)

## 2022-10-15 LAB — MICROSCOPIC EXAMINATION: Bacteria, UA: NONE SEEN
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
## 2022-11-03 ENCOUNTER — Other Ambulatory Visit: Payer: Self-pay | Admitting: Family Medicine

## 2022-11-03 ENCOUNTER — Other Ambulatory Visit (HOSPITAL_COMMUNITY): Payer: Self-pay

## 2022-11-03 ENCOUNTER — Other Ambulatory Visit: Payer: Self-pay

## 2022-11-03 MED ORDER — PANTOPRAZOLE SODIUM 40 MG PO TBEC
40.0000 mg | DELAYED_RELEASE_TABLET | Freq: Every day | ORAL | 1 refills | Status: DC
  Filled 2022-11-03 (×2): qty 90, 90d supply, fill #0
  Filled 2023-03-18: qty 90, 90d supply, fill #1

## 2022-11-04 ENCOUNTER — Other Ambulatory Visit (HOSPITAL_COMMUNITY): Payer: Self-pay

## 2022-11-05 ENCOUNTER — Other Ambulatory Visit (HOSPITAL_COMMUNITY): Payer: Self-pay

## 2022-11-05 ENCOUNTER — Encounter (HOSPITAL_COMMUNITY): Payer: Self-pay

## 2022-11-06 ENCOUNTER — Other Ambulatory Visit: Payer: Self-pay

## 2022-11-06 ENCOUNTER — Other Ambulatory Visit (HOSPITAL_COMMUNITY): Payer: Self-pay

## 2022-11-12 ENCOUNTER — Encounter: Payer: Self-pay | Admitting: Orthopedic Surgery

## 2022-11-19 ENCOUNTER — Encounter: Payer: Commercial Managed Care - PPO | Admitting: Orthopedic Surgery

## 2022-12-07 ENCOUNTER — Encounter: Payer: Self-pay | Admitting: Orthopedic Surgery

## 2022-12-07 ENCOUNTER — Ambulatory Visit (INDEPENDENT_AMBULATORY_CARE_PROVIDER_SITE_OTHER): Payer: 59 | Admitting: Orthopedic Surgery

## 2022-12-07 VITALS — BP 148/79 | Ht 66.0 in | Wt 235.0 lb

## 2022-12-07 DIAGNOSIS — M65312 Trigger thumb, left thumb: Secondary | ICD-10-CM

## 2022-12-07 MED ORDER — METHYLPREDNISOLONE ACETATE 40 MG/ML IJ SUSP
40.0000 mg | Freq: Once | INTRAMUSCULAR | Status: AC
  Administered 2022-12-07: 40 mg via INTRA_ARTICULAR

## 2022-12-07 NOTE — Progress Notes (Signed)
Chief Complaint  Patient presents with   Hand Problem    Left thumb locking x 3 months    62 year old male presents with pain in his left thumb and locking and catching he just had a locking episode last evening.  He will have to often pull the thumb back out into extension  There was some questionable carpal tunnel symptoms but he does not have the classic nocturnal symptoms the numbness and tingling.  He does have a history of cervical spondylosis and has some pain in his left upper extremity for which she has gotten injections in the past  There was some question as to whether he had thenar atrophy  On examination I do not see any thenar atrophy he cannot make the thumb lock and click today he does not have any tenderness over the A1 pulley  He does report some weakness when he is lifting things at work which may be related to his cervical spine disease  The flexor tendon is working fine there is no numbness or tingling  I think it is worthy to get the injection in the A1 pulley area to see if this is the cause of the symptoms  Return if no improvement after injection  Encounter Diagnosis  Name Primary?   Trigger thumb, left thumb Yes   Trigger finger injection  Diagnosis tenosynovitis left thumb Procedure injection A1 pulley Medications lidocaine 1% 1 mL and Depo-Medrol 40 mg 1 mL Skin prep alcohol and ethyl chloride Verbal consent was obtained Timeout confirmed the injection site  After cleaning the skin with alcohol and anesthetizing the skin with ethyl chloride the A1 pulley was palpated and the injection was performed without complication

## 2022-12-31 ENCOUNTER — Encounter: Payer: Self-pay | Admitting: Radiology

## 2023-01-01 ENCOUNTER — Other Ambulatory Visit (HOSPITAL_COMMUNITY): Payer: Self-pay

## 2023-03-18 ENCOUNTER — Other Ambulatory Visit: Payer: Self-pay | Admitting: Family Medicine

## 2023-03-18 ENCOUNTER — Other Ambulatory Visit (HOSPITAL_COMMUNITY): Payer: Self-pay

## 2023-03-18 ENCOUNTER — Encounter: Payer: Self-pay | Admitting: Orthopedic Surgery

## 2023-03-18 ENCOUNTER — Ambulatory Visit (INDEPENDENT_AMBULATORY_CARE_PROVIDER_SITE_OTHER): Payer: 59 | Admitting: Orthopedic Surgery

## 2023-03-18 ENCOUNTER — Other Ambulatory Visit (INDEPENDENT_AMBULATORY_CARE_PROVIDER_SITE_OTHER): Payer: 59

## 2023-03-18 VITALS — BP 134/89 | HR 80 | Ht 66.0 in | Wt 236.0 lb

## 2023-03-18 DIAGNOSIS — M19071 Primary osteoarthritis, right ankle and foot: Secondary | ICD-10-CM | POA: Diagnosis not present

## 2023-03-18 DIAGNOSIS — M25571 Pain in right ankle and joints of right foot: Secondary | ICD-10-CM

## 2023-03-18 MED ORDER — CELECOXIB 200 MG PO CAPS
200.0000 mg | ORAL_CAPSULE | Freq: Two times a day (BID) | ORAL | 0 refills | Status: DC
  Filled 2023-03-18: qty 60, 30d supply, fill #0

## 2023-03-18 MED ORDER — AMLODIPINE BESYLATE 5 MG PO TABS
5.0000 mg | ORAL_TABLET | Freq: Every day | ORAL | 0 refills | Status: DC
  Filled 2023-03-18: qty 90, 90d supply, fill #0

## 2023-03-18 NOTE — Patient Instructions (Signed)
[facility]  Office address is [address] [place] The phone number is [phone]  The office will call you with an appointment Dr. Duda   
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
## 2023-03-18 NOTE — Progress Notes (Signed)
Encounter Diagnoses  Name Primary?   Pain in right ankle and joints of right foot Yes   Arthritis of right midfoot

## 2023-03-19 ENCOUNTER — Other Ambulatory Visit (HOSPITAL_COMMUNITY): Payer: Self-pay

## 2023-04-01 ENCOUNTER — Ambulatory Visit (INDEPENDENT_AMBULATORY_CARE_PROVIDER_SITE_OTHER): Payer: 59 | Admitting: Orthopedic Surgery

## 2023-04-01 DIAGNOSIS — M25571 Pain in right ankle and joints of right foot: Secondary | ICD-10-CM

## 2023-04-01 DIAGNOSIS — M6701 Short Achilles tendon (acquired), right ankle: Secondary | ICD-10-CM

## 2023-04-05 ENCOUNTER — Encounter: Payer: Self-pay | Admitting: Orthopedic Surgery

## 2023-04-05 ENCOUNTER — Encounter: Payer: Self-pay | Admitting: Sports Medicine

## 2023-04-05 ENCOUNTER — Ambulatory Visit (INDEPENDENT_AMBULATORY_CARE_PROVIDER_SITE_OTHER): Payer: 59 | Admitting: Sports Medicine

## 2023-04-05 DIAGNOSIS — M25571 Pain in right ankle and joints of right foot: Secondary | ICD-10-CM | POA: Diagnosis not present

## 2023-04-05 DIAGNOSIS — M19071 Primary osteoarthritis, right ankle and foot: Secondary | ICD-10-CM | POA: Diagnosis not present

## 2023-04-05 DIAGNOSIS — M6701 Short Achilles tendon (acquired), right ankle: Secondary | ICD-10-CM | POA: Diagnosis not present

## 2023-04-05 DIAGNOSIS — M722 Plantar fascial fibromatosis: Secondary | ICD-10-CM

## 2023-04-05 NOTE — Progress Notes (Signed)
Years of ongoing pain Has tried shoe inserts to no avail Has not tried injections/PT  Just picked up celebrex to take
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
## 2023-04-14 ENCOUNTER — Ambulatory Visit (INDEPENDENT_AMBULATORY_CARE_PROVIDER_SITE_OTHER): Payer: 59 | Admitting: Sports Medicine

## 2023-04-14 ENCOUNTER — Encounter: Payer: Self-pay | Admitting: Sports Medicine

## 2023-04-14 ENCOUNTER — Encounter: Payer: Self-pay | Admitting: Family Medicine

## 2023-04-14 DIAGNOSIS — M6701 Short Achilles tendon (acquired), right ankle: Secondary | ICD-10-CM | POA: Diagnosis not present

## 2023-04-14 DIAGNOSIS — M25571 Pain in right ankle and joints of right foot: Secondary | ICD-10-CM | POA: Diagnosis not present

## 2023-04-14 DIAGNOSIS — M722 Plantar fascial fibromatosis: Secondary | ICD-10-CM | POA: Diagnosis not present

## 2023-04-14 NOTE — Progress Notes (Signed)
Still having some pain; shockwave helped for a few days, then pain returned
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
## 2023-04-16 ENCOUNTER — Encounter: Payer: Self-pay | Admitting: Family Medicine

## 2023-04-28 ENCOUNTER — Ambulatory Visit (INDEPENDENT_AMBULATORY_CARE_PROVIDER_SITE_OTHER): Payer: 59 | Admitting: Sports Medicine

## 2023-04-28 ENCOUNTER — Other Ambulatory Visit (HOSPITAL_COMMUNITY): Payer: Self-pay

## 2023-04-28 ENCOUNTER — Other Ambulatory Visit: Payer: Self-pay

## 2023-04-28 ENCOUNTER — Encounter: Payer: Self-pay | Admitting: Sports Medicine

## 2023-04-28 DIAGNOSIS — M6701 Short Achilles tendon (acquired), right ankle: Secondary | ICD-10-CM

## 2023-04-28 DIAGNOSIS — M25571 Pain in right ankle and joints of right foot: Secondary | ICD-10-CM | POA: Diagnosis not present

## 2023-04-28 DIAGNOSIS — M722 Plantar fascial fibromatosis: Secondary | ICD-10-CM

## 2023-04-28 DIAGNOSIS — M19071 Primary osteoarthritis, right ankle and foot: Secondary | ICD-10-CM

## 2023-04-28 MED ORDER — METHYLPREDNISOLONE 4 MG PO TBPK
ORAL_TABLET | ORAL | 0 refills | Status: DC
  Filled 2023-04-28: qty 21, 6d supply, fill #0

## 2023-04-28 NOTE — Progress Notes (Signed)
Not doing so good;  States the treatment only helps for a few days and then pain is back
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
## 2023-05-04 ENCOUNTER — Ambulatory Visit
Admission: EM | Admit: 2023-05-04 | Discharge: 2023-05-04 | Disposition: A | Discharge status: Home / Self Care | Payer: 59 | Attending: Nurse Practitioner | Admitting: Nurse Practitioner

## 2023-05-04 ENCOUNTER — Encounter: Payer: Self-pay | Admitting: Emergency Medicine

## 2023-05-04 DIAGNOSIS — Z1152 Encounter for screening for COVID-19: Secondary | ICD-10-CM | POA: Diagnosis not present

## 2023-05-04 DIAGNOSIS — J069 Acute upper respiratory infection, unspecified: Secondary | ICD-10-CM | POA: Diagnosis not present

## 2023-05-04 MED ORDER — BENZONATATE 100 MG PO CAPS
100.0000 mg | ORAL_CAPSULE | Freq: Three times a day (TID) | ORAL | 0 refills | Status: DC | PRN
  Filled 2023-05-04: qty 21, 7d supply, fill #0

## 2023-05-04 NOTE — ED Triage Notes (Signed)
Cough, headache, sneezing and nasal congestion since Sunday.  Has been taking Thera flu without relief.

## 2023-05-04 NOTE — Discharge Instructions (Addendum)
You have a viral upper respiratory infection.  Symptoms should improve over the next week to 10 days.  If you develop chest pain or shortness of breath, go to the emergency room.  We have tested you today for COVID-19.  You will see the results in Mychart and we will call you with positive results.    Please stay home and isolate until you are aware of the results.    Some things that can make you feel better are: - Increased rest - Increasing fluid with water/sugar free electrolytes - Acetaminophen and ibuprofen as needed for fever/pain - Salt water gargling, chloraseptic spray and throat lozenges - OTC guaifenesin (Mucinex) 600 mg twice daily - Saline sinus flushes or a neti pot - Humidifying the air -Tessalon Perles every 8 hours as needed for dry cough 
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
## 2023-05-05 ENCOUNTER — Other Ambulatory Visit: Payer: Self-pay

## 2023-05-05 LAB — SARS CORONAVIRUS 2 (TAT 6-24 HRS): SARS Coronavirus 2: NEGATIVE

## 2023-05-25 ENCOUNTER — Encounter: Payer: Self-pay | Admitting: Family Medicine

## 2023-05-26 ENCOUNTER — Ambulatory Visit (INDEPENDENT_AMBULATORY_CARE_PROVIDER_SITE_OTHER): Payer: 59 | Admitting: Sports Medicine

## 2023-05-26 ENCOUNTER — Encounter: Payer: Self-pay | Admitting: Sports Medicine

## 2023-05-26 ENCOUNTER — Other Ambulatory Visit: Payer: Self-pay

## 2023-05-26 DIAGNOSIS — M722 Plantar fascial fibromatosis: Secondary | ICD-10-CM | POA: Diagnosis not present

## 2023-05-26 DIAGNOSIS — M79671 Pain in right foot: Secondary | ICD-10-CM | POA: Diagnosis not present

## 2023-05-26 DIAGNOSIS — M6701 Short Achilles tendon (acquired), right ankle: Secondary | ICD-10-CM

## 2023-05-26 DIAGNOSIS — M25571 Pain in right ankle and joints of right foot: Secondary | ICD-10-CM | POA: Diagnosis not present

## 2023-05-26 MED ORDER — BETAMETHASONE SOD PHOS & ACET 6 (3-3) MG/ML IJ SUSP
6.0000 mg | INTRAMUSCULAR | Status: AC | PRN
Start: 2023-05-26 — End: 2023-05-26
  Administered 2023-05-26: 6 mg via INTRA_ARTICULAR

## 2023-05-26 MED ORDER — LIDOCAINE HCL 1 % IJ SOLN
2.0000 mL | INTRAMUSCULAR | Status: AC | PRN
Start: 2023-05-26 — End: 2023-05-26
  Administered 2023-05-26: 2 mL
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
## 2023-05-27 ENCOUNTER — Encounter: Payer: Self-pay | Admitting: Family Medicine

## 2023-05-27 ENCOUNTER — Ambulatory Visit: Payer: 59 | Admitting: Family Medicine

## 2023-05-27 VITALS — BP 112/77 | HR 105 | Ht 66.0 in | Wt 236.0 lb

## 2023-05-27 DIAGNOSIS — M4712 Other spondylosis with myelopathy, cervical region: Secondary | ICD-10-CM

## 2023-05-27 DIAGNOSIS — M4722 Other spondylosis with radiculopathy, cervical region: Secondary | ICD-10-CM | POA: Diagnosis not present

## 2023-05-27 DIAGNOSIS — E559 Vitamin D deficiency, unspecified: Secondary | ICD-10-CM | POA: Diagnosis not present

## 2023-05-27 DIAGNOSIS — M542 Cervicalgia: Secondary | ICD-10-CM

## 2023-05-27 DIAGNOSIS — R7302 Impaired glucose tolerance (oral): Secondary | ICD-10-CM | POA: Diagnosis not present

## 2023-05-27 DIAGNOSIS — Z0001 Encounter for general adult medical examination with abnormal findings: Secondary | ICD-10-CM | POA: Diagnosis not present

## 2023-05-27 DIAGNOSIS — I10 Essential (primary) hypertension: Secondary | ICD-10-CM | POA: Diagnosis not present

## 2023-05-27 DIAGNOSIS — E785 Hyperlipidemia, unspecified: Secondary | ICD-10-CM | POA: Diagnosis not present

## 2023-05-27 NOTE — Assessment & Plan Note (Signed)
Deteriorated   Patient re-educated about  the importance of commitment to a  minimum of 150 minutes of exercise per week as able.  The importance of healthy food choices with portion control discussed, as well as eating regularly and within a 12 hour window most days. The need to choose "clean , green" food 50 to 75% of the time is discussed, as well as to make water the primary drink and set a goal of 64 ounces water daily.       05/27/2023    4:03 PM 03/18/2023    8:48 AM 12/07/2022    3:35 PM  Weight /BMI  Weight 236 lb 236 lb 235 lb  Height 5\' 6"  (1.676 m) 5\' 6"  (1.676 m) 5\' 6"  (1.676 m)  BMI 38.09 kg/m2 38.09 kg/m2 37.93 kg/m2

## 2023-05-27 NOTE — Assessment & Plan Note (Addendum)
Worsening causing limitayion in Left arm movement  Update MRI and refer for neurosurgery re eval

## 2023-05-27 NOTE — Assessment & Plan Note (Signed)
Abnormal MRI C spine in 2017, worsened symptoms in 2024, update imaging and Neurosurgery to eval Experiencing tingling in LE

## 2023-05-27 NOTE — Assessment & Plan Note (Signed)
Annual exam as documented. . Immunization and cancer screening needs are specifically addressed at this visit.  

## 2023-05-27 NOTE — Patient Instructions (Signed)
Follow up in 4  months  Your will be referred for MRI on your neck.  Will send mychart message in regards to labs.

## 2023-05-27 NOTE — Progress Notes (Signed)
   Anthony Erickson     MRN: [id number]      DOB: 23-Jan-1961  Chief Complaint  Patient presents with   Annual Exam    CPE discuss pinched nerve in neck making arms and legs hurt, weight loss medication, and sleep     HPI: Patient is in for annual physical exam. C/o increased and disabling left neck pain radiaiting to left elbow unable to lift left arm above head because of pain Chronic left foot pain pain under Orthopedic care x 6 months, now in walking boot, may ultimately need surgery Having increased difficulty working   PE; BP 112/77 (BP Location: Left Arm, Patient Position: Sitting, Cuff Size: Large)   Pulse (!) 105   Ht 5\' 6"  (1.676 m)   Wt 236 lb (107 kg)   SpO2 94%   BMI 38.09 kg/m   Pleasant male, alert and oriented x 3, in no cardio-pulmonary distress. Afebrile. HEENT No facial trauma or asymetry. Sinuses non tender. EOMI External ears normal,  Neck:decreased ROM, no adenopathy,JVD or thyromegaly.No bruits.  Chest: Clear to ascultation bilaterally.No crackles or wheezes. Non tender to palpation  Cardiovascular system; Heart sounds normal,  S1 and  S2 ,no S3.  No murmur, or thrill.   Abdomen: Soft, non tender,  Musculoskeletal exam: Decreased  ROM of spine, and left shoulder and normal in  knees. No deformity ,swelling or crepitus noted. No muscle wasting or atrophy.   Neurologic: Cranial nerves 2 to 12 intact. Power, tone ,sensation  normal throughout.  disturbance in gait. No tremor.  Skin: Intact, no ulceration, erythema , scaling or rash noted. Pigmentation normal throughout  Psych; Normal mood and affect. At times tearful when he describes the extent of pain and debility he is experiencing due to pain and neuromuscular disease.Judgement and concentration normal   Assessment & Plan:  Annual visit for general adult medical examination with abnormal findings Annual exam as documented. . Immunization and cancer screening needs are specifically  addressed at this visit.   Morbid obesity (HCC) Deteriorated   Patient re-educated about  the importance of commitment to a  minimum of 150 minutes of exercise per week as able.  The importance of healthy food choices with portion control discussed, as well as eating regularly and within a 12 hour window most days. The need to choose "clean , green" food 50 to 75% of the time is discussed, as well as to make water the primary drink and set a goal of 64 ounces water daily.       05/27/2023    4:03 PM 03/18/2023    8:48 AM 12/07/2022    3:35 PM  Weight /BMI  Weight 236 lb 236 lb 235 lb  Height 5\' 6"  (1.676 m) 5\' 6"  (1.676 m) 5\' 6"  (1.676 m)  BMI 38.09 kg/m2 38.09 kg/m2 37.93 kg/m2      Neck pain on left side Worsening causing limitayion in Left arm movement  Update MRI and refer for neurosurgery re eval  Cervical spondylosis with myelopathy and radiculopathy Abnormal MRI C spine in 2017, worsened symptoms in 2024, update imaging and Neurosurgery to eval Experiencing tingling in LE

## 2023-05-28 LAB — CMP14+EGFR
CO2: 22 mmol/L (ref 20–29)
Calcium: 9.7 mg/dL (ref 8.6–10.2)

## 2023-05-31 ENCOUNTER — Encounter: Payer: Self-pay | Admitting: Family Medicine

## 2023-05-31 ENCOUNTER — Other Ambulatory Visit: Payer: Self-pay

## 2023-05-31 ENCOUNTER — Other Ambulatory Visit: Payer: Self-pay | Admitting: Family Medicine

## 2023-05-31 ENCOUNTER — Other Ambulatory Visit (HOSPITAL_COMMUNITY): Payer: Self-pay

## 2023-05-31 DIAGNOSIS — R7302 Impaired glucose tolerance (oral): Secondary | ICD-10-CM

## 2023-05-31 MED ORDER — VITAMIN D 25 MCG (1000 UNIT) PO TABS
1000.0000 [IU] | ORAL_TABLET | Freq: Every day | ORAL | 2 refills | Status: DC
  Filled 2023-05-31: qty 30, 30d supply, fill #0

## 2023-06-01 ENCOUNTER — Other Ambulatory Visit: Payer: Self-pay

## 2023-06-01 ENCOUNTER — Ambulatory Visit (HOSPITAL_COMMUNITY)
Admission: RE | Admit: 2023-06-01 | Discharge: 2023-06-01 | Disposition: A | Discharge status: Home / Self Care | Payer: 59 | Source: Ambulatory Visit | Attending: Family Medicine | Admitting: Family Medicine

## 2023-06-01 ENCOUNTER — Other Ambulatory Visit (HOSPITAL_COMMUNITY): Payer: Self-pay

## 2023-06-01 DIAGNOSIS — M5021 Other cervical disc displacement,  high cervical region: Secondary | ICD-10-CM | POA: Diagnosis not present

## 2023-06-01 DIAGNOSIS — M4802 Spinal stenosis, cervical region: Secondary | ICD-10-CM | POA: Diagnosis not present

## 2023-06-01 DIAGNOSIS — M4722 Other spondylosis with radiculopathy, cervical region: Secondary | ICD-10-CM | POA: Insufficient documentation

## 2023-06-01 DIAGNOSIS — M47812 Spondylosis without myelopathy or radiculopathy, cervical region: Secondary | ICD-10-CM | POA: Diagnosis not present

## 2023-06-01 DIAGNOSIS — M4712 Other spondylosis with myelopathy, cervical region: Secondary | ICD-10-CM | POA: Diagnosis not present

## 2023-06-01 DIAGNOSIS — M542 Cervicalgia: Secondary | ICD-10-CM | POA: Diagnosis not present

## 2023-06-01 DIAGNOSIS — M5023 Other cervical disc displacement, cervicothoracic region: Secondary | ICD-10-CM | POA: Diagnosis not present

## 2023-06-01 MED ORDER — AMLODIPINE BESYLATE 5 MG PO TABS
5.0000 mg | ORAL_TABLET | Freq: Every day | ORAL | 0 refills | Status: DC
  Filled 2023-06-01 (×2): qty 90, 90d supply, fill #0

## 2023-06-01 MED ORDER — VITAMIN D (ERGOCALCIFEROL) 1.25 MG (50000 UNIT) PO CAPS
50000.0000 [IU] | ORAL_CAPSULE | ORAL | 4 refills | Status: DC
  Filled 2023-06-01 (×2): qty 5, 35d supply, fill #0

## 2023-06-01 MED ORDER — GABAPENTIN 300 MG PO CAPS
300.0000 mg | ORAL_CAPSULE | Freq: Every day | ORAL | 1 refills | Status: DC
  Filled 2023-06-01: qty 90, 90d supply, fill #0

## 2023-06-01 MED ORDER — ONE-DAILY MULTI VITAMINS PO TABS
1.0000 | ORAL_TABLET | Freq: Every day | ORAL | 1 refills | Status: DC
  Filled 2023-06-01: qty 90, 90d supply, fill #0

## 2023-06-02 ENCOUNTER — Telehealth: Payer: Self-pay | Admitting: Family Medicine

## 2023-06-02 ENCOUNTER — Other Ambulatory Visit (HOSPITAL_COMMUNITY): Payer: Self-pay

## 2023-06-02 ENCOUNTER — Ambulatory Visit (HOSPITAL_COMMUNITY): Payer: 59

## 2023-06-02 NOTE — Telephone Encounter (Signed)
FMLA  Noted  Copied Sleeved  Original in PCP box Copy front desk folder

## 2023-06-07 ENCOUNTER — Encounter: Payer: Self-pay | Admitting: Family Medicine

## 2023-06-07 NOTE — Telephone Encounter (Signed)
Patient came by the office and would like to proceed with the shots in neck needs the referral.

## 2023-06-08 ENCOUNTER — Other Ambulatory Visit: Payer: Self-pay | Admitting: Family Medicine

## 2023-06-08 ENCOUNTER — Other Ambulatory Visit: Payer: Self-pay

## 2023-06-08 DIAGNOSIS — M4712 Other spondylosis with myelopathy, cervical region: Secondary | ICD-10-CM

## 2023-06-08 NOTE — Telephone Encounter (Signed)
Patient aware referral sent for epidural injection and fmla papers will be given to his other doctor treating him for this issue

## 2023-06-16 ENCOUNTER — Ambulatory Visit: Payer: 59 | Admitting: Sports Medicine

## 2023-06-17 ENCOUNTER — Ambulatory Visit (INDEPENDENT_AMBULATORY_CARE_PROVIDER_SITE_OTHER): Payer: 59 | Admitting: Sports Medicine

## 2023-06-17 ENCOUNTER — Encounter: Payer: Self-pay | Admitting: Sports Medicine

## 2023-06-17 DIAGNOSIS — M25571 Pain in right ankle and joints of right foot: Secondary | ICD-10-CM

## 2023-06-17 DIAGNOSIS — M722 Plantar fascial fibromatosis: Secondary | ICD-10-CM | POA: Diagnosis not present

## 2023-06-17 DIAGNOSIS — M7661 Achilles tendinitis, right leg: Secondary | ICD-10-CM
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
## 2023-06-21 ENCOUNTER — Other Ambulatory Visit (HOSPITAL_BASED_OUTPATIENT_CLINIC_OR_DEPARTMENT_OTHER): Payer: Self-pay

## 2023-06-22 ENCOUNTER — Encounter: Payer: Self-pay | Admitting: Sports Medicine

## 2023-06-23 ENCOUNTER — Encounter (HOSPITAL_COMMUNITY): Payer: Self-pay

## 2023-06-23 ENCOUNTER — Other Ambulatory Visit: Payer: Self-pay | Admitting: Sports Medicine

## 2023-06-23 ENCOUNTER — Other Ambulatory Visit (HOSPITAL_COMMUNITY): Payer: Self-pay

## 2023-06-23 ENCOUNTER — Other Ambulatory Visit: Payer: Self-pay

## 2023-06-23 MED ORDER — TRAMADOL HCL 50 MG PO TABS
50.0000 mg | ORAL_TABLET | Freq: Four times a day (QID) | ORAL | 0 refills | Status: DC | PRN
Start: 2023-06-23 — End: 2023-09-10
  Filled 2023-06-23 (×2): qty 18, 5d supply, fill #0

## 2023-06-28 ENCOUNTER — Ambulatory Visit: Payer: 59 | Admitting: Sports Medicine

## 2023-07-07 ENCOUNTER — Other Ambulatory Visit: Payer: Self-pay | Admitting: Family Medicine

## 2023-07-08 ENCOUNTER — Other Ambulatory Visit (HOSPITAL_COMMUNITY): Payer: Self-pay

## 2023-07-08 MED ORDER — AMLODIPINE BESYLATE 5 MG PO TABS
5.0000 mg | ORAL_TABLET | Freq: Every day | ORAL | 0 refills | Status: DC
  Filled 2023-07-08 – 2023-09-10 (×2): qty 90, 90d supply, fill #0

## 2023-07-12 ENCOUNTER — Encounter: Payer: Self-pay | Admitting: Sports Medicine

## 2023-07-12 ENCOUNTER — Ambulatory Visit (INDEPENDENT_AMBULATORY_CARE_PROVIDER_SITE_OTHER): Payer: 59 | Admitting: Sports Medicine

## 2023-07-12 DIAGNOSIS — G8929 Other chronic pain: Secondary | ICD-10-CM | POA: Diagnosis not present

## 2023-07-12 DIAGNOSIS — M25571 Pain in right ankle and joints of right foot: Secondary | ICD-10-CM | POA: Diagnosis not present

## 2023-07-12 DIAGNOSIS — M6701 Short Achilles tendon (acquired), right ankle: Secondary | ICD-10-CM

## 2023-07-12 DIAGNOSIS — M79671 Pain in right foot: Secondary | ICD-10-CM | POA: Diagnosis not present

## 2023-07-12 NOTE — Patient Instructions (Addendum)
Orthotics to Try (that will give more rigid support):  *Over-the Counter ones:  - Spencos Rx Orthotic (green) - SuperFeet All-Purpose Orthotics  *We can have them custom-made for you over at [facility]. They do a very nice job with these. The cost is usually between $170-180  https://www.google.com/search?q=spencos+orthotics&sca_esv=[email or web address]&sca_upv=1&sxsrf=[id number]&ei=guzeZuKrBpPfp84P5fvAwAk&ved=0ahUKEwji9NDi8LWIAxWT78kDHeU9EJgQ4dUDCBA&uact=5&oq=spencos+orthotics&gs_lp=Egxnd3Mtd2l6LXNlcnAiEXNwZW53Mg b3J0aG90aWNzMggQABiiBBiJBTIIEAAYogQYiQUyCBAAGIAEGKIEMggQABiABBiiBEjwB1AAWO0FcAB4AZABAJgBjQGgAcsGqgEDMC43uAEDyAEA-AEBmAIEoALrA8ICBhAAGAcYHsICCxAAGIAEGJECGIoFwgIIEAAYBxgeGA_CAgQQABgemAMAkgcDMC40oAenLQ&sclient=gws-wiz-serp  https://www.[email or web address]/search?q=superfeet+orthotics&sca_[email or web address]&sca_upv=1&sxsrf=[id number]&ei=iezeZuLzLsa2wN4P4NKtyQQ&ved=0ahUKEwii3KTm8LWIAxVGG9AFHWBpK0kQ4dUDCBA&uact=5&oq=superfeet+orthotics&gs_lp=Egxnd3Mtd2l6LXNlcnAiE3N1cGVyZmVldCBvcnRob3RpY3MyCxAAGIAEGJECGIoFMgoQABiABBgUGIcCMgsQABiABBiRAhiKBTIFEAAYgAQyBRAAGIAEMgYQABgHGB4yBhAAGAcYHjIGEAAYBxgeMgYQABgHGB4yBhAAGAcYHkjUC1AAWM4KcAB4AZABAJgBjAGgAagJqgEEMC4xMLgBA8gBAPgBAZgCBqAC4QXCAgoQABiABBixAxgNwgIHEAAYgAQYDcICDhAuGIAEGJECGOUEGIoFwgIdEC4YgAQYkQIY5QQYigUYlwUY3AQY3gQY4ATYAQGYAwDiAwUSATEgQLoGBggBEAEYFJIHAzAuNqAHukM&sclient=gws-wiz-serp

## 2023-07-12 NOTE — Progress Notes (Signed)
Patient states that sometimes his foot feels better with the insoles, sometimes it feels worse with the insoles. Pain is consistently the same as far as type and location (heel).
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
## 2023-07-27 ENCOUNTER — Ambulatory Visit (HOSPITAL_COMMUNITY): Payer: 59 | Attending: Sports Medicine

## 2023-07-27 ENCOUNTER — Other Ambulatory Visit: Payer: Self-pay

## 2023-07-27 DIAGNOSIS — M6701 Short Achilles tendon (acquired), right ankle: Secondary | ICD-10-CM

## 2023-07-27 DIAGNOSIS — R262 Difficulty in walking, not elsewhere classified: Secondary | ICD-10-CM

## 2023-07-27 DIAGNOSIS — G8929 Other chronic pain: Secondary | ICD-10-CM | POA: Diagnosis not present

## 2023-07-27 DIAGNOSIS — M79671 Pain in right foot: Secondary | ICD-10-CM | POA: Diagnosis not present

## 2023-07-27 NOTE — Patient Instructions (Signed)
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
## 2023-07-28 ENCOUNTER — Other Ambulatory Visit: Payer: Self-pay | Admitting: Family Medicine

## 2023-07-28 ENCOUNTER — Encounter: Payer: Self-pay | Admitting: Family Medicine

## 2023-07-29 ENCOUNTER — Other Ambulatory Visit (HOSPITAL_COMMUNITY): Payer: Self-pay

## 2023-07-29 MED ORDER — PANTOPRAZOLE SODIUM 40 MG PO TBEC
40.0000 mg | DELAYED_RELEASE_TABLET | Freq: Every day | ORAL | 1 refills | Status: DC
  Filled 2023-07-29: qty 90, 90d supply, fill #0
  Filled 2023-09-10 – 2023-11-04 (×5): qty 90, 90d supply, fill #1

## 2023-08-04 ENCOUNTER — Encounter (HOSPITAL_COMMUNITY): Payer: 59 | Admitting: Physical Therapy

## 2023-08-05 ENCOUNTER — Ambulatory Visit (HOSPITAL_COMMUNITY): Payer: 59 | Attending: Sports Medicine | Admitting: Physical Therapy

## 2023-08-05 DIAGNOSIS — M6701 Short Achilles tendon (acquired), right ankle: Secondary | ICD-10-CM | POA: Diagnosis not present

## 2023-08-05 DIAGNOSIS — M79671 Pain in right foot: Secondary | ICD-10-CM | POA: Diagnosis not present

## 2023-08-05 DIAGNOSIS — G8929 Other chronic pain: Secondary | ICD-10-CM | POA: Insufficient documentation

## 2023-08-05 DIAGNOSIS — R262 Difficulty in walking, not elsewhere classified: Secondary | ICD-10-CM | POA: Diagnosis not present
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
## 2023-08-10 ENCOUNTER — Ambulatory Visit (HOSPITAL_COMMUNITY): Payer: 59 | Admitting: Physical Therapy

## 2023-08-10 ENCOUNTER — Encounter (HOSPITAL_COMMUNITY): Payer: 59

## 2023-08-10 ENCOUNTER — Encounter (HOSPITAL_COMMUNITY): Payer: Self-pay | Admitting: Physical Therapy

## 2023-08-10 DIAGNOSIS — G8929 Other chronic pain: Secondary | ICD-10-CM

## 2023-08-10 DIAGNOSIS — M6701 Short Achilles tendon (acquired), right ankle: Secondary | ICD-10-CM

## 2023-08-10 DIAGNOSIS — M79671 Pain in right foot: Secondary | ICD-10-CM | POA: Diagnosis not present

## 2023-08-10 DIAGNOSIS — R262 Difficulty in walking, not elsewhere classified: Secondary | ICD-10-CM
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
## 2023-08-18 ENCOUNTER — Ambulatory Visit (HOSPITAL_COMMUNITY): Payer: 59

## 2023-08-18 ENCOUNTER — Encounter (HOSPITAL_COMMUNITY): Payer: 59 | Admitting: Physical Therapy

## 2023-08-18 DIAGNOSIS — M79671 Pain in right foot: Secondary | ICD-10-CM | POA: Diagnosis not present

## 2023-08-18 DIAGNOSIS — G8929 Other chronic pain: Secondary | ICD-10-CM

## 2023-08-18 DIAGNOSIS — R262 Difficulty in walking, not elsewhere classified: Secondary | ICD-10-CM | POA: Diagnosis not present

## 2023-08-18 DIAGNOSIS — M6701 Short Achilles tendon (acquired), right ankle: Secondary | ICD-10-CM
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
## 2023-08-22 ENCOUNTER — Encounter: Payer: Self-pay | Admitting: Sports Medicine

## 2023-08-24 ENCOUNTER — Encounter (HOSPITAL_COMMUNITY): Payer: 59 | Admitting: Physical Therapy

## 2023-08-24 ENCOUNTER — Encounter (HOSPITAL_COMMUNITY): Payer: 59

## 2023-09-01 ENCOUNTER — Encounter (HOSPITAL_COMMUNITY): Payer: 59

## 2023-09-02 ENCOUNTER — Telehealth (HOSPITAL_COMMUNITY): Payer: Self-pay

## 2023-09-02 NOTE — Telephone Encounter (Signed)
Pt did not arrive for scheduled appointment. No telephone call nor message preceeded this absence. Author attempted to contact pt via telephone number listed in chart. Pt reports he had to stay over at work longer than scheduled and thus missed his appointment. Pt has not other scheduled visits at this time. He is scheduled to return to Dr. Lajoyce Corners on 11/4, and will contact our office after that accordingly to make additional appointments as needed.   Rosamaria Lints, PT Physical Therapist [facility] [place]  [phone]

## 2023-09-06 ENCOUNTER — Encounter (HOSPITAL_COMMUNITY): Payer: 59

## 2023-09-06 ENCOUNTER — Ambulatory Visit: Payer: 59 | Admitting: Orthopedic Surgery

## 2023-09-10 ENCOUNTER — Other Ambulatory Visit (HOSPITAL_COMMUNITY): Payer: Self-pay

## 2023-09-10 ENCOUNTER — Other Ambulatory Visit: Payer: Self-pay

## 2023-09-10 ENCOUNTER — Encounter: Payer: Self-pay | Admitting: Family Medicine

## 2023-09-10 MED ORDER — TRAZODONE HCL 50 MG PO TABS
25.0000 mg | ORAL_TABLET | Freq: Every evening | ORAL | 3 refills | Status: DC | PRN
  Filled 2023-09-10: qty 30, 30d supply, fill #0

## 2023-09-11 ENCOUNTER — Other Ambulatory Visit (HOSPITAL_COMMUNITY): Payer: Self-pay

## 2023-09-13 ENCOUNTER — Other Ambulatory Visit: Payer: Self-pay

## 2023-09-14 ENCOUNTER — Other Ambulatory Visit: Payer: Self-pay

## 2023-09-16 ENCOUNTER — Ambulatory Visit: Payer: 59 | Admitting: Orthopedic Surgery

## 2023-09-17 ENCOUNTER — Other Ambulatory Visit: Payer: Self-pay

## 2023-09-20 ENCOUNTER — Ambulatory Visit: Payer: 59 | Admitting: Orthopedic Surgery

## 2023-10-04 ENCOUNTER — Other Ambulatory Visit (HOSPITAL_COMMUNITY): Payer: Self-pay

## 2023-10-04 ENCOUNTER — Ambulatory Visit (INDEPENDENT_AMBULATORY_CARE_PROVIDER_SITE_OTHER): Payer: 59 | Admitting: Orthopedic Surgery

## 2023-10-04 ENCOUNTER — Other Ambulatory Visit (INDEPENDENT_AMBULATORY_CARE_PROVIDER_SITE_OTHER): Payer: 59

## 2023-10-04 ENCOUNTER — Encounter: Payer: Self-pay | Admitting: Orthopedic Surgery

## 2023-10-04 DIAGNOSIS — G8929 Other chronic pain: Secondary | ICD-10-CM

## 2023-10-04 DIAGNOSIS — M51361 Other intervertebral disc degeneration, lumbar region with lower extremity pain only: Secondary | ICD-10-CM | POA: Diagnosis not present

## 2023-10-04 DIAGNOSIS — M545 Low back pain, unspecified: Secondary | ICD-10-CM

## 2023-10-04 MED ORDER — PREDNISONE 10 MG PO TABS
10.0000 mg | ORAL_TABLET | Freq: Every day | ORAL | 0 refills | Status: DC
  Filled 2023-10-04: qty 30, 30d supply, fill #0
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
## 2023-10-05 ENCOUNTER — Encounter: Payer: Self-pay | Admitting: Family Medicine

## 2023-10-05 ENCOUNTER — Other Ambulatory Visit: Payer: Self-pay

## 2023-10-05 ENCOUNTER — Ambulatory Visit: Payer: 59 | Admitting: Family Medicine

## 2023-10-05 ENCOUNTER — Other Ambulatory Visit (HOSPITAL_COMMUNITY): Payer: Self-pay

## 2023-10-05 VITALS — BP 126/75 | HR 93 | Ht 66.0 in | Wt 240.0 lb

## 2023-10-05 DIAGNOSIS — M4712 Other spondylosis with myelopathy, cervical region: Secondary | ICD-10-CM | POA: Diagnosis not present

## 2023-10-05 DIAGNOSIS — R7302 Impaired glucose tolerance (oral): Secondary | ICD-10-CM | POA: Diagnosis not present

## 2023-10-05 DIAGNOSIS — G8929 Other chronic pain: Secondary | ICD-10-CM

## 2023-10-05 DIAGNOSIS — M4722 Other spondylosis with radiculopathy, cervical region: Secondary | ICD-10-CM | POA: Diagnosis not present

## 2023-10-05 DIAGNOSIS — I10 Essential (primary) hypertension: Secondary | ICD-10-CM

## 2023-10-05 DIAGNOSIS — M5442 Lumbago with sciatica, left side: Secondary | ICD-10-CM

## 2023-10-05 DIAGNOSIS — E785 Hyperlipidemia, unspecified: Secondary | ICD-10-CM

## 2023-10-05 DIAGNOSIS — M5441 Lumbago with sciatica, right side: Secondary | ICD-10-CM

## 2023-10-05 DIAGNOSIS — E559 Vitamin D deficiency, unspecified: Secondary | ICD-10-CM

## 2023-10-05 DIAGNOSIS — M542 Cervicalgia: Secondary | ICD-10-CM

## 2023-10-05 NOTE — Patient Instructions (Addendum)
F/U end January,or early February, call if you need me sooner  You are referred to Dr Alvester Morin re neck and back pain  Continue current medications  Fasting CBC, lipid,  and vit D 3 to 5 days before next appointment  Try to deal best as you can with stress  Thanks for choosing [facility], we consider it a privelige to serve you.   Best for 2025!

## 2023-10-05 NOTE — Progress Notes (Unsigned)
   Anthony Erickson     MRN: [id number]      DOB: 10-16-1961  Chief Complaint  Patient presents with   Follow-up    Follow up requesting some sort of disability due to sciatica and pain with standing on the job    HPI Anthony Erickson is here for follow up and re-evaluation of chronic medical conditions, medication management and review of any available recent lab and radiology data.  Preventive health is updated, specifically  Cancer screening and Immunization.   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 Denies sinus pressure, nasal congestion, ear pain or sore throat. Denies chest congestion, productive cough or wheezing. Denies chest pains, palpitations and leg swelling Denies abdominal pain, nausea, vomiting,diarrhea or constipation.   Denies dysuria, frequency, hesitancy or incontinence.  Denies headaches, seizures, Denies depression, anxiety or insomnia. Denies skin break down or rash.   PE  BP 126/75 (BP Location: Right Arm, Patient Position: Sitting, Cuff Size: Large)   Pulse 93   Ht 5\' 6"  (1.676 m)   Wt 240 lb (108.9 kg)   SpO2 96%   BMI 38.74 kg/m   Patient alert and oriented and in no cardiopulmonary distress.  HEENT: No facial asymmetry, EOMI,     Neck decreased ROM .  Chest: Clear to auscultation bilaterally.  CVS: S1, S2 no murmurs, no S3.Regular rate.  ABD: Soft non tender.   Ext: No  edema  MS: Decreased  ROM lumbar spine, adequate in shoulders, hips and knees.  Skin: Intact, no ulcerations or rash noted.  Psych: Good eye contact, normal affect. Memory intact not anxious or depressed appearing.  CNS: CN 2-12 intact, power,  normal throughout.no focal deficits noted.   Assessment & Plan

## 2023-10-07 ENCOUNTER — Telehealth: Payer: Self-pay | Admitting: Physical Medicine and Rehabilitation

## 2023-10-07 NOTE — Telephone Encounter (Signed)
Patient called returning a call from [place]. CB#[phone]

## 2023-10-08 ENCOUNTER — Telehealth: Payer: Self-pay | Admitting: Physical Medicine and Rehabilitation

## 2023-10-08 NOTE — Telephone Encounter (Signed)
Patient returned call asked for a call back to schedule an  appointment with [facility]. The number to contact patient is [phone] Patient said he is heading to work now. Patient asked if he can be called back during his lunch break?   3:15-3:45 Lunch

## 2023-10-14 ENCOUNTER — Other Ambulatory Visit: Payer: 59

## 2023-10-14 DIAGNOSIS — Z8546 Personal history of malignant neoplasm of prostate: Secondary | ICD-10-CM | POA: Diagnosis not present

## 2023-10-15 LAB — PSA: Prostate Specific Ag, Serum: <0.1 ng/mL (ref 0.0–4.0)

## 2023-10-21 ENCOUNTER — Encounter: Payer: Self-pay | Admitting: Urology

## 2023-10-21 ENCOUNTER — Ambulatory Visit (INDEPENDENT_AMBULATORY_CARE_PROVIDER_SITE_OTHER): Payer: 59 | Admitting: Urology

## 2023-10-21 VITALS — BP 154/76 | HR 105

## 2023-10-21 DIAGNOSIS — Z8546 Personal history of malignant neoplasm of prostate: Secondary | ICD-10-CM

## 2023-10-21 DIAGNOSIS — R351 Nocturia: Secondary | ICD-10-CM

## 2023-10-21 LAB — URINALYSIS, ROUTINE W REFLEX MICROSCOPIC
Bilirubin, UA: NEGATIVE
Glucose, UA: NEGATIVE
Ketones, UA: NEGATIVE
Leukocytes,UA: NEGATIVE
Nitrite, UA: NEGATIVE
RBC, UA: NEGATIVE
Specific Gravity, UA: 1.03 (ref 1.005–1.030)
Urobilinogen, Ur: 0.2 mg/dL (ref 0.2–1.0)
pH, UA: 6 (ref 5.0–7.5)
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
## 2023-10-28 DIAGNOSIS — G8929 Other chronic pain: Secondary | ICD-10-CM | POA: Insufficient documentation

## 2023-10-28 DIAGNOSIS — M542 Cervicalgia: Secondary | ICD-10-CM | POA: Insufficient documentation

## 2023-10-28 NOTE — Assessment & Plan Note (Signed)
Patient educated about the importance of limiting  Carbohydrate intake , the need to commit to daily physical activity for a minimum of 30 minutes , and to commit weight loss. The fact that changes in all these areas will reduce or eliminate all together the development of diabetes is stressed.      Latest Ref Rng & Units 05/27/2023   10:51 AM 10/01/2022    8:32 AM 04/02/2022    3:07 PM 04/01/2022    9:19 AM 06/24/2021    9:50 AM  Diabetic Labs  HbA1c 4.8 - 5.6 % 5.6  5.4  5.4   5.5   Chol 100 - 199 mg/dL 409  811   914    HDL >78 mg/dL 48  40   39    Calc LDL 0 - 99 mg/dL 295  621   308    Triglycerides 0 - 149 mg/dL 69  71   95    Creatinine 0.76 - 1.27 mg/dL 6.57  8.46   9.62        10/21/2023   10:37 AM 10/05/2023    3:55 PM 05/27/2023    4:05 PM 05/27/2023    4:03 PM 05/04/2023    6:04 PM 03/18/2023    8:48 AM 12/07/2022    3:35 PM  BP/Weight  Systolic BP 154 126 112 147 134 134 148  Diastolic BP 76 75 77 72 85 89 79  Wt. (Lbs)  240  236  236 235  BMI  38.74 kg/m2  38.09 kg/m2  38.09 kg/m2 37.93 kg/m2       No data to display          Updated lab needed at/ before next visit.

## 2023-10-28 NOTE — Assessment & Plan Note (Signed)
Reports disabling right neck pain  refer to pain management, interfering with his ability to function

## 2023-10-28 NOTE — Assessment & Plan Note (Signed)
Updated lab needed at/ before next visit.   

## 2023-10-28 NOTE — Assessment & Plan Note (Signed)
Hyperlipidemia:Low fat diet discussed and encouraged.   Lipid Panel  Lab Results  Component Value Date   CHOL 168 05/27/2023   HDL 48 05/27/2023   LDLCALC 107 (H) 05/27/2023   TRIG 69 05/27/2023   CHOLHDL 3.5 05/27/2023     Updated lab needed at/ before next visit.

## 2023-10-28 NOTE — Assessment & Plan Note (Signed)
Increased and uncontrolled right neck pain, debilitating, refer pain management

## 2023-10-28 NOTE — Assessment & Plan Note (Signed)
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

## 2023-10-28 NOTE — Assessment & Plan Note (Signed)
Obesity associated with hypertension and arthritis Patient re-educated about  the importance of commitment to a  minimum of 150 minutes of exercise per week as able.  The importance of healthy food choices with portion control discussed, as well as eating regularly and within a 12 hour window most days. The need to choose "clean , green" food 50 to 75% of the time is discussed, as well as to make water the primary drink and set a goal of 64 ounces water daily.       10/05/2023    3:55 PM 05/27/2023    4:03 PM 03/18/2023    8:48 AM  Weight /BMI  Weight 240 lb 236 lb 236 lb  Height 5\' 6"  (1.676 m) 5\' 6"  (1.676 m) 5\' 6"  (1.676 m)  BMI 38.74 kg/m2 38.09 kg/m2 38.09 kg/m2    Needs to lower caloric intake

## 2023-10-28 NOTE — Assessment & Plan Note (Signed)
DASH diet and commitment to daily physical activity for a minimum of 30 minutes discussed and encouraged, as a part of hypertension management. The importance of attaining a healthy weight is also discussed. Controlled, no change in medication      10/21/2023   10:37 AM 10/05/2023    3:55 PM 05/27/2023    4:05 PM 05/27/2023    4:03 PM 05/04/2023    6:04 PM 03/18/2023    8:48 AM 12/07/2022    3:35 PM  BP/Weight  Systolic BP 154 126 112 147 134 134 148  Diastolic BP 76 75 77 72 85 89 79  Wt. (Lbs)  240  236  236 235  BMI  38.74 kg/m2  38.09 kg/m2  38.09 kg/m2 37.93 kg/m2

## 2023-11-01 ENCOUNTER — Ambulatory Visit: Payer: 59 | Admitting: Orthopedic Surgery

## 2023-11-01 ENCOUNTER — Other Ambulatory Visit (HOSPITAL_COMMUNITY): Payer: Self-pay

## 2023-11-01 ENCOUNTER — Ambulatory Visit
Admission: EM | Admit: 2023-11-01 | Discharge: 2023-11-01 | Disposition: A | Discharge status: Home / Self Care | Payer: 59 | Attending: Nurse Practitioner | Admitting: Nurse Practitioner

## 2023-11-01 DIAGNOSIS — Z1152 Encounter for screening for COVID-19: Secondary | ICD-10-CM | POA: Insufficient documentation

## 2023-11-01 DIAGNOSIS — J069 Acute upper respiratory infection, unspecified: Secondary | ICD-10-CM | POA: Insufficient documentation

## 2023-11-01 MED ORDER — FLUTICASONE PROPIONATE 50 MCG/ACT NA SUSP
2.0000 | Freq: Every day | NASAL | 0 refills | Status: DC
Start: 2023-11-01 — End: 2024-01-20
  Filled 2023-11-01: qty 16, 30d supply, fill #0

## 2023-11-01 MED ORDER — PROMETHAZINE-DM 6.25-15 MG/5ML PO SYRP
5.0000 mL | ORAL_SOLUTION | Freq: Four times a day (QID) | ORAL | 0 refills | Status: DC | PRN
  Filled 2023-11-01: qty 60, 3d supply, fill #0
  Filled 2023-11-02: qty 58, 3d supply, fill #1
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
## 2023-11-01 NOTE — Discharge Instructions (Addendum)
COVID test is pending.  You will be contacted if the pending test result is positive.  As discussed, if your COVID test is negative, I will send in a prescription for an antibiotic for you on 11/02/2023. Take medication as prescribed.  Suggest over-the-counter Mucinex DM, which is a pill To take during the daytime for your cough. Increase fluids and allow for plenty of rest. Continue Tylenol as needed for pain, fever, general discomfort. Recommend use of normal saline nasal spray throughout the day for nasal congestion or runny nose. For your cough, recommend using a humidifier in your bedroom at nighttime during sleep and sleeping slightly elevated on pillows while cough symptoms persist. If symptoms do not improve with this treatment, please follow-up with your primary care physician for further evaluation. Follow-up as needed.

## 2023-11-01 NOTE — ED Triage Notes (Signed)
Pt reports cough, congestion, x 6 days, night sweats, fever, x 1 day

## 2023-11-02 ENCOUNTER — Telehealth: Payer: Self-pay

## 2023-11-02 LAB — SARS CORONAVIRUS 2 (TAT 6-24 HRS): SARS Coronavirus 2: POSITIVE — AB

## 2023-11-02 NOTE — Telephone Encounter (Signed)
 Pt has been notified of COVID results, and recommendations to treat sx's. Pt has verbalized understanding.

## 2023-11-04 ENCOUNTER — Encounter (HOSPITAL_COMMUNITY): Payer: Self-pay

## 2023-11-04 ENCOUNTER — Other Ambulatory Visit (HOSPITAL_BASED_OUTPATIENT_CLINIC_OR_DEPARTMENT_OTHER): Payer: Self-pay

## 2023-11-05 ENCOUNTER — Other Ambulatory Visit (HOSPITAL_COMMUNITY): Payer: Self-pay

## 2023-11-05 ENCOUNTER — Ambulatory Visit: Payer: 59 | Admitting: Family Medicine

## 2023-11-24 ENCOUNTER — Other Ambulatory Visit: Payer: Self-pay

## 2023-11-25 ENCOUNTER — Other Ambulatory Visit (HOSPITAL_BASED_OUTPATIENT_CLINIC_OR_DEPARTMENT_OTHER): Payer: Self-pay

## 2023-11-25 ENCOUNTER — Other Ambulatory Visit (HOSPITAL_COMMUNITY): Payer: Self-pay

## 2023-11-25 ENCOUNTER — Other Ambulatory Visit: Payer: Self-pay | Admitting: Orthopedic Surgery

## 2023-11-25 MED ORDER — PREDNISONE 10 MG PO TABS
10.0000 mg | ORAL_TABLET | Freq: Every day | ORAL | 0 refills | Status: DC
  Filled 2023-11-25: qty 30, 30d supply, fill #0

## 2023-12-09 ENCOUNTER — Ambulatory Visit: Payer: 59 | Admitting: Family Medicine

## 2024-01-17 ENCOUNTER — Ambulatory Visit: Admitting: Orthopedic Surgery

## 2024-01-19 ENCOUNTER — Other Ambulatory Visit (HOSPITAL_COMMUNITY): Payer: Self-pay

## 2024-01-19 ENCOUNTER — Other Ambulatory Visit: Payer: Self-pay | Admitting: Family Medicine

## 2024-01-19 ENCOUNTER — Other Ambulatory Visit: Payer: Self-pay

## 2024-01-19 ENCOUNTER — Other Ambulatory Visit: Payer: Self-pay | Admitting: Orthopedic Surgery

## 2024-01-19 MED ORDER — PANTOPRAZOLE SODIUM 40 MG PO TBEC
40.0000 mg | DELAYED_RELEASE_TABLET | Freq: Every day | ORAL | 1 refills | Status: DC
  Filled 2024-01-19 (×2): qty 90, 90d supply, fill #0

## 2024-01-19 MED ORDER — PREDNISONE 10 MG PO TABS
10.0000 mg | ORAL_TABLET | Freq: Every day | ORAL | 0 refills | Status: DC
  Filled 2024-01-19: qty 30, 30d supply, fill #0

## 2024-01-19 MED ORDER — AMLODIPINE BESYLATE 5 MG PO TABS
5.0000 mg | ORAL_TABLET | Freq: Every day | ORAL | 0 refills | Status: DC
  Filled 2024-01-19: qty 90, 90d supply, fill #0

## 2024-01-20 ENCOUNTER — Other Ambulatory Visit (HOSPITAL_COMMUNITY): Payer: Self-pay

## 2024-01-20 ENCOUNTER — Encounter: Payer: Self-pay | Admitting: Family Medicine

## 2024-01-20 ENCOUNTER — Other Ambulatory Visit: Payer: Self-pay

## 2024-01-20 ENCOUNTER — Ambulatory Visit: Payer: 59 | Admitting: Family Medicine

## 2024-01-20 ENCOUNTER — Other Ambulatory Visit (HOSPITAL_BASED_OUTPATIENT_CLINIC_OR_DEPARTMENT_OTHER): Payer: Self-pay

## 2024-01-20 VITALS — BP 124/81 | HR 107 | Ht 66.0 in | Wt 251.1 lb

## 2024-01-20 DIAGNOSIS — M545 Low back pain, unspecified: Secondary | ICD-10-CM

## 2024-01-20 DIAGNOSIS — K219 Gastro-esophageal reflux disease without esophagitis: Secondary | ICD-10-CM

## 2024-01-20 DIAGNOSIS — M79604 Pain in right leg: Secondary | ICD-10-CM | POA: Diagnosis not present

## 2024-01-20 DIAGNOSIS — M79605 Pain in left leg: Secondary | ICD-10-CM | POA: Diagnosis not present

## 2024-01-20 DIAGNOSIS — F419 Anxiety disorder, unspecified: Secondary | ICD-10-CM | POA: Diagnosis not present

## 2024-01-20 DIAGNOSIS — E559 Vitamin D deficiency, unspecified: Secondary | ICD-10-CM | POA: Diagnosis not present

## 2024-01-20 DIAGNOSIS — E785 Hyperlipidemia, unspecified: Secondary | ICD-10-CM

## 2024-01-20 DIAGNOSIS — I10 Essential (primary) hypertension: Secondary | ICD-10-CM | POA: Diagnosis not present

## 2024-01-20 MED ORDER — AMLODIPINE BESYLATE 5 MG PO TABS
5.0000 mg | ORAL_TABLET | Freq: Every day | ORAL | 0 refills | Status: DC
  Filled 2024-01-20 – 2024-03-28 (×4): qty 90, 90d supply, fill #0

## 2024-01-20 MED ORDER — FLUTICASONE PROPIONATE 50 MCG/ACT NA SUSP
2.0000 | Freq: Every day | NASAL | 0 refills | Status: DC
  Filled 2024-01-20: qty 16, 30d supply, fill #0

## 2024-01-20 MED ORDER — PANTOPRAZOLE SODIUM 40 MG PO TBEC
40.0000 mg | DELAYED_RELEASE_TABLET | Freq: Every day | ORAL | 1 refills | Status: AC
  Filled 2024-01-20 – 2024-07-05 (×2): qty 90, 90d supply, fill #0

## 2024-01-20 NOTE — Patient Instructions (Addendum)
 Annual exam 6 months, call if you need me before  No change in medication  Thanks for choosing [facility], we consider it a privelige to serve you.   Lab result note will be sent to you  [facility] your back pain is addressed with physiatry   Thanks for choosing [facility], we consider it a privelige to serve you.

## 2024-01-20 NOTE — Progress Notes (Signed)
 Anthony Erickson     MRN: [id number]      DOB: 03/31/1961  Chief Complaint  Patient presents with   Follow-up    Chronic back pain,  Pain in legs feels like pain is running like water down the front of his legs.  Pt. Stated that he made an appt. [facility]/ [facility] for Cortizone shots starting on 02/03/24 Would like to discuss disability claim     HPI Anthony Erickson is here with increased  c/o low back painradiaiting down legs , unable yto sytand without b10 plus pain for more than 1 hr, c/o weakness in legs,no near falls, sitting relieves pain.  Pain is aggravated by coughing and sneezing, radiates to toes, no pain sitting or lying down. Denies incontinence of stool or urine ROS Denies recent fever or chills. Denies sinus pressure, nasal congestion, ear pain or sore throat. Denies chest congestion, productive cough or wheezing. Denies chest pains, palpitations and leg swelling Denies abdominal pain, nausea, vomiting,diarrhea or constipation.   Denies dysuria, frequency, hesitancy or incontinence.  Denies depression,  does have anxiety or insomnia. Denies skin break down or rash.   PE  BP 124/81   Pulse (!) 107   Ht 5\' 6"  (1.676 m)   Wt 251 lb 1.9 oz (113.9 kg)   SpO2 96%   BMI 40.53 kg/m   Patient alert and oriented and in no cardiopulmonary distress.  HEENT: No facial asymmetry, EOMI,     Neck supple .  Chest: Clear to auscultation bilaterally.  CVS: S1, S2 no murmurs, no S3.Regular rate.  ABD: Soft non tender.   Ext: No edema  MS: decreased  ROM spine, adequate in shoulders, hips and knees.  Skin: Intact, no ulcerations or rash noted.  Psych: Good eye contact, normal affect. Memory intact not anxious or depressed appearing.  CNS: CN 2-12 intact, power,  normal throughout.no focal deficits noted.   Assessment & Plan  Essential hypertension Controlled, no change in medication DASH diet and commitment to daily physical activity for a minimum of 30 minutes  discussed and encouraged, as a part of hypertension management. The importance of attaining a healthy weight is also discussed.     01/20/2024    3:06 PM 11/01/2023    8:52 AM 10/21/2023   10:37 AM 10/05/2023    3:55 PM 05/27/2023    4:05 PM 05/27/2023    4:03 PM 05/04/2023    6:04 PM  BP/Weight  Systolic BP 124 117 154 126 112 147 134  Diastolic BP 81 77 76 75 77 72 85  Wt. (Lbs) 251.12   240  236   BMI 40.53 kg/m2   38.74 kg/m2  38.09 kg/m2        Dyslipidemia Hyperlipidemia:Low fat diet discussed and encouraged.   Lipid Panel  Lab Results  Component Value Date   CHOL 174 01/20/2024   HDL 46 01/20/2024   LDLCALC 107 (H) 01/20/2024   TRIG 119 01/20/2024   CHOLHDL 3.8 01/20/2024     Needs to reduce fat intake  GERD Controlled, no change in medication   Lumbar pain with radiation down both legs Uncontrolled and worsening, limited ability to continue to work in a job which requires a lot of standing  Morbid obesity (HCC)  Patient re-educated about  the importance of commitment to a  minimum of 150 minutes of exercise per week as able.  The importance of healthy food choices with portion control discussed, as well as eating regularly  and within a 12 hour window most days. The need to choose "clean , green" food 50 to 75% of the time is discussed, as well as to make water the primary drink and set a goal of 64 ounces water daily.       01/20/2024    3:06 PM 10/05/2023    3:55 PM 05/27/2023    4:03 PM  Weight /BMI  Weight 251 lb 1.9 oz 240 lb 236 lb  Height 5\' 6"  (1.676 m) 5\' 6"  (1.676 m) 5\' 6"  (1.676 m)  BMI 40.53 kg/m2 38.74 kg/m2 38.09 kg/m2    deteriorrated  Vitamin D deficiency Needs weekly supplement, level is low  Anxiety Increased but no interest in therapy or medication, poor health and caring for a nephew with special needs is challenging

## 2024-01-21 ENCOUNTER — Other Ambulatory Visit: Payer: Self-pay

## 2024-01-21 ENCOUNTER — Other Ambulatory Visit (HOSPITAL_COMMUNITY): Payer: Self-pay

## 2024-01-21 ENCOUNTER — Encounter: Payer: Self-pay | Admitting: Family Medicine

## 2024-01-21 LAB — CBC
Hematocrit: 45.3 % (ref 37.5–51.0)
Hemoglobin: 15.1 g/dL (ref 13.0–17.7)
MCH: 32 pg (ref 26.6–33.0)
MCHC: 33.3 g/dL (ref 31.5–35.7)
MCV: 96 fL (ref 79–97)
Platelets: 253 10*3/uL (ref 150–450)
RBC: 4.72 x10E6/uL (ref 4.14–5.80)
RDW: 12 % (ref 11.6–15.4)
WBC: 7.7 10*3/uL (ref 3.4–10.8)

## 2024-01-21 LAB — LIPID PANEL
Chol/HDL Ratio: 3.8 ratio (ref 0.0–5.0)
Cholesterol, Total: 174 mg/dL (ref 100–199)
HDL: 46 mg/dL (ref >39)
LDL Chol Calc (NIH): 107 mg/dL — ABNORMAL HIGH (ref 0–99)
Triglycerides: 119 mg/dL (ref 0–149)
VLDL Cholesterol Cal: 21 mg/dL (ref 5–40)

## 2024-01-21 LAB — VITAMIN D 25 HYDROXY (VIT D DEFICIENCY, FRACTURES): Vit D, 25-Hydroxy: 16.3 ng/mL — ABNORMAL LOW (ref 30.0–100.0)

## 2024-01-21 MED ORDER — VITAMIN D (ERGOCALCIFEROL) 1.25 MG (50000 UNIT) PO CAPS
50000.0000 [IU] | ORAL_CAPSULE | ORAL | 2 refills | Status: DC
  Filled 2024-01-21 (×3): qty 12, 84d supply, fill #0

## 2024-01-24 ENCOUNTER — Ambulatory Visit: Admitting: Orthopedic Surgery

## 2024-01-25 ENCOUNTER — Other Ambulatory Visit (HOSPITAL_COMMUNITY): Payer: Self-pay

## 2024-01-25 ENCOUNTER — Encounter (HOSPITAL_COMMUNITY): Payer: Self-pay

## 2024-01-28 NOTE — Assessment & Plan Note (Signed)
  Patient re-educated about  the importance of commitment to a  minimum of 150 minutes of exercise per week as able.  The importance of healthy food choices with portion control discussed, as well as eating regularly and within a 12 hour window most days. The need to choose "clean , green" food 50 to 75% of the time is discussed, as well as to make water the primary drink and set a goal of 64 ounces water daily.       01/20/2024    3:06 PM 10/05/2023    3:55 PM 05/27/2023    4:03 PM  Weight /BMI  Weight 251 lb 1.9 oz 240 lb 236 lb  Height 5\' 6"  (1.676 m) 5\' 6"  (1.676 m) 5\' 6"  (1.676 m)  BMI 40.53 kg/m2 38.74 kg/m2 38.09 kg/m2    deteriorrated

## 2024-01-28 NOTE — Assessment & Plan Note (Signed)
 Controlled, no change in medication

## 2024-01-28 NOTE — Assessment & Plan Note (Signed)
 Controlled, no change in medication DASH diet and commitment to daily physical activity for a minimum of 30 minutes discussed and encouraged, as a part of hypertension management. The importance of attaining a healthy weight is also discussed.     01/20/2024    3:06 PM 11/01/2023    8:52 AM 10/21/2023   10:37 AM 10/05/2023    3:55 PM 05/27/2023    4:05 PM 05/27/2023    4:03 PM 05/04/2023    6:04 PM  BP/Weight  Systolic BP 124 117 154 126 112 147 134  Diastolic BP 81 77 76 75 77 72 85  Wt. (Lbs) 251.12   240  236   BMI 40.53 kg/m2   38.74 kg/m2  38.09 kg/m2

## 2024-01-28 NOTE — Assessment & Plan Note (Signed)
 Uncontrolled and worsening, limited ability to continue to work in a job which requires a lot of standing

## 2024-01-28 NOTE — Assessment & Plan Note (Signed)
 Increased but no interest in therapy or medication, poor health and caring for a nephew with special needs is challenging

## 2024-01-28 NOTE — Assessment & Plan Note (Signed)
 Hyperlipidemia:Low fat diet discussed and encouraged.   Lipid Panel  Lab Results  Component Value Date   CHOL 174 01/20/2024   HDL 46 01/20/2024   LDLCALC 107 (H) 01/20/2024   TRIG 119 01/20/2024   CHOLHDL 3.8 01/20/2024     Needs to reduce fat intake

## 2024-01-28 NOTE — Assessment & Plan Note (Signed)
 Needs weekly supplement, level is low

## 2024-02-03 ENCOUNTER — Ambulatory Visit: Admitting: Physical Medicine and Rehabilitation

## 2024-02-03 DIAGNOSIS — M5416 Radiculopathy, lumbar region: Secondary | ICD-10-CM

## 2024-02-03 DIAGNOSIS — G8929 Other chronic pain: Secondary | ICD-10-CM

## 2024-02-03 DIAGNOSIS — R202 Paresthesia of skin: Secondary | ICD-10-CM | POA: Diagnosis not present

## 2024-02-03 DIAGNOSIS — M5442 Lumbago with sciatica, left side: Secondary | ICD-10-CM | POA: Diagnosis not present

## 2024-02-03 DIAGNOSIS — M5441 Lumbago with sciatica, right side: Secondary | ICD-10-CM | POA: Diagnosis not present
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
## 2024-02-03 NOTE — Progress Notes (Signed)
 Pain Scale   Average Pain 5        +Driver, -BT, -Dye Allergies.

## 2024-02-04 ENCOUNTER — Encounter: Payer: Self-pay | Admitting: Physical Medicine and Rehabilitation

## 2024-02-08 ENCOUNTER — Ambulatory Visit
Admission: RE | Admit: 2024-02-08 | Discharge: 2024-02-08 | Disposition: A | Discharge status: Home / Self Care | Source: Ambulatory Visit | Attending: Physical Medicine and Rehabilitation | Admitting: Physical Medicine and Rehabilitation

## 2024-02-08 DIAGNOSIS — M5416 Radiculopathy, lumbar region: Secondary | ICD-10-CM

## 2024-02-08 DIAGNOSIS — M48061 Spinal stenosis, lumbar region without neurogenic claudication: Secondary | ICD-10-CM | POA: Diagnosis not present

## 2024-02-08 DIAGNOSIS — M5442 Lumbago with sciatica, left side: Secondary | ICD-10-CM | POA: Diagnosis not present

## 2024-02-08 DIAGNOSIS — G8929 Other chronic pain: Secondary | ICD-10-CM

## 2024-02-08 DIAGNOSIS — R202 Paresthesia of skin: Secondary | ICD-10-CM

## 2024-02-08 DIAGNOSIS — M4726 Other spondylosis with radiculopathy, lumbar region: Secondary | ICD-10-CM | POA: Diagnosis not present

## 2024-02-08 DIAGNOSIS — M5441 Lumbago with sciatica, right side: Secondary | ICD-10-CM | POA: Diagnosis not present

## 2024-02-18 ENCOUNTER — Other Ambulatory Visit (HOSPITAL_COMMUNITY): Payer: Self-pay

## 2024-02-19 ENCOUNTER — Other Ambulatory Visit: Payer: Self-pay | Admitting: Family Medicine

## 2024-02-19 ENCOUNTER — Other Ambulatory Visit (HOSPITAL_COMMUNITY): Payer: Self-pay

## 2024-02-24 ENCOUNTER — Ambulatory Visit: Admitting: Physical Medicine and Rehabilitation

## 2024-02-24 DIAGNOSIS — M48062 Spinal stenosis, lumbar region with neurogenic claudication: Secondary | ICD-10-CM

## 2024-02-24 DIAGNOSIS — M5441 Lumbago with sciatica, right side: Secondary | ICD-10-CM

## 2024-02-24 DIAGNOSIS — G8929 Other chronic pain: Secondary | ICD-10-CM

## 2024-02-24 DIAGNOSIS — M5416 Radiculopathy, lumbar region: Secondary | ICD-10-CM

## 2024-02-24 DIAGNOSIS — M5442 Lumbago with sciatica, left side: Secondary | ICD-10-CM | POA: Diagnosis not present
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
## 2024-02-24 NOTE — Progress Notes (Signed)
 Pain Scale   Average Pain 0 MRI review Patient advising his lower back pain starts when standing for long periods of time and radiating bilaterally to both legs. Deatra Face states he does have some relief when sitting.        +Driver, -BT, -Dye Allergies.

## 2024-02-25 ENCOUNTER — Encounter: Payer: Self-pay | Admitting: Physical Medicine and Rehabilitation

## 2024-03-13 ENCOUNTER — Other Ambulatory Visit (HOSPITAL_COMMUNITY): Payer: Self-pay

## 2024-03-27 ENCOUNTER — Encounter (HOSPITAL_COMMUNITY): Payer: Self-pay | Admitting: Pharmacist

## 2024-03-27 ENCOUNTER — Other Ambulatory Visit (HOSPITAL_COMMUNITY): Payer: Self-pay

## 2024-03-28 ENCOUNTER — Other Ambulatory Visit (HOSPITAL_COMMUNITY): Payer: Self-pay

## 2024-03-28 ENCOUNTER — Encounter (HOSPITAL_COMMUNITY): Payer: Self-pay

## 2024-03-29 ENCOUNTER — Other Ambulatory Visit (INDEPENDENT_AMBULATORY_CARE_PROVIDER_SITE_OTHER): Payer: PRIVATE HEALTH INSURANCE

## 2024-03-29 ENCOUNTER — Ambulatory Visit (INDEPENDENT_AMBULATORY_CARE_PROVIDER_SITE_OTHER): Payer: PRIVATE HEALTH INSURANCE | Admitting: Orthopedic Surgery

## 2024-03-29 VITALS — BP 148/85 | HR 102 | Ht 66.54 in | Wt 251.8 lb

## 2024-03-29 DIAGNOSIS — G8929 Other chronic pain: Secondary | ICD-10-CM | POA: Diagnosis not present

## 2024-03-29 DIAGNOSIS — M5442 Lumbago with sciatica, left side: Secondary | ICD-10-CM | POA: Diagnosis not present

## 2024-03-29 DIAGNOSIS — M5441 Lumbago with sciatica, right side: Secondary | ICD-10-CM

## 2024-03-29 NOTE — Progress Notes (Signed)
 Orthopedic Spine Surgery Office Note  Assessment: Patient is a 63 y.o. male with low back pain that radiates into bilateral lower extremities and the dorsal aspect of bilateral feet consistent with lumbar radiculopathy. Has stenosis at L4/5   Plan: -Explained that initially conservative treatment is tried as a significant number of patients may experience relief with these treatment modalities. Discussed that the conservative treatments include:  -activity modification  -physical therapy  -over the counter pain medications  -medrol  dosepak  -lumbar steroid injections -Patient has tried PT, tylenol , aleve , prednisone , gabapentin   -Talked about steroid injection, L4/5 laminectomy and fusion, and pain management as remaining treatment options. Patient wanted to proceed with injection. Referral provided to him today -Can use tylenol  up to 1000mg  TID -Patient should return to office in 6 weeks, x-rays at next visit: none   Patient expressed understanding of the plan and all questions were answered to the patient's satisfaction.   ___________________________________________________________________________   History:  Patient is a 63 y.o. male who presents today for lumbar spine. Patient has had 4-5 months of low back pain and bilateral leg pain. He feels the pain in his lower lumbar spine and in his bilateral legs. He feels it going into multiple distributions in the legs. He said it goes all the way to the dorsum of his feet. Pain is felt with activity and at rest. At its worst, he rates the pain as a 8/10. He has not found any of the conservative treatments tried so far to give him any lasting relief. There was no trauma or injury that preceded the onset of pain.    Weakness: denies Symptoms of imbalance: denies Paresthesias and numbness: denies Bowel or bladder incontinence: denies Saddle anesthesia: denies  Treatments tried: PT, tylenol , aleve , prednisone , gabapentin   Review of  systems: Denies fevers and chills, night sweats, unexplained weight loss. Has a history of prostate cancer. Has had pain that wakes him at night  Past medical history: GERD HLD HTN Prostate cancer  Allergies: diclofenac , meloxicam   Past surgical history:  Appendectomy Cheilectomy Cholecystectomy Polypectomy Prostate seed implantation  Social history: Denies use of nicotine product (smoking, vaping, patches, smokeless) Alcohol use: rare Denies recreational drug use   Physical Exam:  BMI of 39.0  General: no acute distress, appears stated age Neurologic: alert, answering questions appropriately, following commands Respiratory: unlabored breathing on room air, symmetric chest rise Psychiatric: appropriate affect, normal cadence to speech   MSK (spine):  -Strength exam      Left  Right EHL    5/5  5/5 TA    5/5  5/5 GSC    5/5  5/5 Knee extension  5/5  5/5 Hip flexion   5/5  5/5  -Sensory exam    Sensation intact to light touch in L3-S1 nerve distributions of bilateral lower extremities  -Achilles DTR: 2/4 on the left, 2/4 on the right -Patellar tendon DTR: 2/4 on the left, 2/4 on the right  -Straight leg raise: negative bilaterally -Femoral nerve stretch test: negative bilaterally -Clonus: no beats bilaterally  -Left hip exam: no pain through range of motion -Right hip exam: no pain through range of motion  Imaging: XRs of the lumbar spine from 03/29/2024 were independently reviewed and interpreted, showing disc height loss at L1/2 and L2/3. Spondylolisthesis at L4/5. No fracture or dislocation seen.   MRI of the lumbar spine from 02/08/2024 was independently reviewed and interpreted, showing central, lateral recess, and bilateral foraminal stenosis at L4/5. Spondylolisthesis at L4/5. Bilateral foraminal stenosis  at L5/S1.   Patient name: Anthony Erickson Patient MRN: [id number] Date of visit: 03/29/24

## 2024-04-03 ENCOUNTER — Encounter: Payer: Self-pay | Admitting: Orthopedic Surgery

## 2024-05-01 ENCOUNTER — Ambulatory Visit (INDEPENDENT_AMBULATORY_CARE_PROVIDER_SITE_OTHER): Payer: PRIVATE HEALTH INSURANCE | Admitting: Physical Medicine and Rehabilitation

## 2024-05-01 ENCOUNTER — Other Ambulatory Visit: Payer: Self-pay

## 2024-05-01 VITALS — BP 134/89 | HR 90

## 2024-05-01 DIAGNOSIS — M48062 Spinal stenosis, lumbar region with neurogenic claudication: Secondary | ICD-10-CM | POA: Diagnosis not present

## 2024-05-01 DIAGNOSIS — M5416 Radiculopathy, lumbar region: Secondary | ICD-10-CM

## 2024-05-01 MED ORDER — METHYLPREDNISOLONE ACETATE 40 MG/ML IJ SUSP
40.0000 mg | Freq: Once | INTRAMUSCULAR | Status: AC
  Administered 2024-05-01: 40 mg

## 2024-05-01 NOTE — Patient Instructions (Signed)

## 2024-05-01 NOTE — Progress Notes (Signed)
 Pain Scale   Average Pain 6 Patient advising he has chronic  lower back pain and states the pain is constant when standing and walking. Patient advising that pain decreases when resting and sitting        +Driver, -BT, -Dye Allergies.

## 2024-05-01 NOTE — Progress Notes (Signed)
 Anthony Erickson - 63 y.o. male MRN [id number]  Date of birth: 1961-01-08  Office Visit Note: Visit Date: 05/01/2024 PCP: Antonetta Rollene BRAVO, MD Referred by: Georgina Ozell LABOR, MD  Subjective: Chief Complaint  Patient presents with   Lower Back - Pain   HPI:  Anthony Erickson is a 63 y.o. male who comes in today at the request of Dr. Ozell Georgina for planned Right L4-5 Lumbar Interlaminar epidural steroid injection with fluoroscopic guidance.  The patient has failed conservative care including home exercise, medications, time and activity modification.  This injection will be diagnostic and hopefully therapeutic.  Please see requesting physician notes for further details and justification.   ROS Otherwise per HPI.  Assessment & Plan: Visit Diagnoses:    ICD-10-CM   1. Spinal stenosis of lumbar region with neurogenic claudication  M48.062 XR C-ARM NO REPORT    Epidural Steroid injection    methylPREDNISolone  acetate (DEPO-MEDROL ) injection 40 mg    2. Lumbar radiculopathy  M54.16 XR C-ARM NO REPORT    Epidural Steroid injection    methylPREDNISolone  acetate (DEPO-MEDROL ) injection 40 mg      Plan: No additional findings.   Meds & Orders:  Meds ordered this encounter  Medications   methylPREDNISolone  acetate (DEPO-MEDROL ) injection 40 mg    Orders Placed This Encounter  Procedures   XR C-ARM NO REPORT   Epidural Steroid injection    Follow-up: Return for visit to requesting provider as needed.   Procedures: No procedures performed  Lumbar Epidural Steroid Injection - Interlaminar Approach with Fluoroscopic Guidance  Patient: Anthony Erickson      Date of Birth: 07-Oct-1961 MRN: [id number] PCP: Antonetta Rollene BRAVO, MD      Visit Date: 05/01/2024   Universal Protocol:     Consent Given By: the patient  Position: PRONE  Additional Comments: Vital signs were monitored before and after the procedure. Patient was prepped and draped in the usual sterile fashion. The  correct patient, procedure, and site was verified.   Injection Procedure Details:   Procedure diagnoses: Spinal stenosis of lumbar region with neurogenic claudication [M48.062]   Meds Administered:  Meds ordered this encounter  Medications   methylPREDNISolone  acetate (DEPO-MEDROL ) injection 40 mg     Laterality: Midline  Location/Site:  L4-5  Needle: 4.5 in., 20 ga. Tuohy  Needle Placement: Paramedian epidural  Findings:   -Comments: Excellent flow of contrast into the epidural space.  Procedure Details: Using a paramedian approach from the side mentioned above, the region overlying the inferior lamina was localized under fluoroscopic visualization and the soft tissues overlying this structure were infiltrated with 4 ml. of 1% Lidocaine  without Epinephrine . The Tuohy needle was inserted into the epidural space using a paramedian approach.   The epidural space was localized using loss of resistance along with counter oblique bi-planar fluoroscopic views.  After negative aspirate for air, blood, and CSF, a 2 ml. volume of Isovue -250 was injected into the epidural space and the flow of contrast was observed. Radiographs were obtained for documentation purposes.    The injectate was administered into the level noted above.   Additional Comments:  The patient tolerated the procedure well Dressing: 2 x 2 sterile gauze and Band-Aid    Post-procedure details: Patient was observed during the procedure. Post-procedure instructions were reviewed.  Patient left the clinic in stable condition.   Clinical History: CLINICAL DATA:  Provided history: Chronic bilateral low back pain with bilateral sciatica. Lumbar radiculopathy. Paresthesia of skin. Low  back pain, symptoms persist with greater than 6 weeks treatment. Additional history provided by the scanning technologist: The patient reports bilateral leg pain, history of prostate cancer.   EXAM: MRI LUMBAR SPINE WITHOUT  CONTRAST   TECHNIQUE: Multiplanar, multisequence MR imaging of the lumbar spine was performed. No intravenous contrast was administered.   COMPARISON:  Report from lumbar spine MRI 04/16/2017 (images currently unavailable). Lumbar spine radiographs 10/04/2023.   FINDINGS: Segmentation: 5 lumbar vertebrae. The caudal most well-formed intervertebral disc space is designated L5-S1.   Alignment: Slight grade 1 retrolisthesis at L1-L2 and L2-L3. 3 mm L4-L5 grade 1 anterolisthesis.   Vertebrae: No lumbar vertebral compression fracture. Multilevel degenerative endplate irregularity. Mild degenerative endplate edema at U87-O8, L1-L2 and L2-L3. Multilevel vertebral body hemangiomas. Multilevel ventrolateral osteophytes.   Conus medullaris and cauda equina: Conus extends to the L2 level. No signal abnormality identified within the visualized distal spinal cord.   Paraspinal and other soft tissues: Unremarkable.   Disc levels:   Multilevel disc degeneration within the lumbar and visualized lower thoracic spine. Disc degeneration is greatest at T11-T12, T12-L1, L1-L2 and L2-L3 (moderate-to-advanced at these levels), and at L4-L5 (moderate).   T11-T12: This level is imaged in the sagittal plane only. Disc bulge with endplate spurring. The disc bulge mildly effaces the ventral thecal sac. Mild right neural foraminal narrowing.   T12-L1: A disc bulge mildly effaces the ventral thecal sac. No significant foraminal stenosis.   L1-L2: Mild grade 1 retrolisthesis. Disc bulge with endplate spurring. Superimposed broad-based right foraminal disc protrusion. Mild facet hypertrophy. The disc bulge mildly effaces the ventral thecal sac. Mild relative right neural foraminal narrowing.   L2-L3: Mild grade 1 retrolisthesis. Disc bulge with endplate spurring. Superimposed small right subarticular disc extrusion with slight caudal migration (series 105, image 7) (series 113, image 10). Mild facet  hypertrophy. Mild relative bilateral subarticular narrowing. No significant central canal stenosis. Bilateral neural foraminal narrowing (mild right, mild-to-moderate left).   L3-L4: Disc bulge asymmetric to the right. Mild-to-moderate facet arthropathy. No significant spinal canal stenosis. Mild left neural foraminal narrowing.   L4-L5: 3 mm grade 1 anterolisthesis. Disc uncovering with disc bulge and endplate spurring. Advanced facet arthropathy with ligamentum flavum hypertrophy. Severe spinal canal stenosis. Redundancy of the cauda equina nerve roots cephalad to this level. Severe bilateral neural foraminal narrowing also present at this level.   L5-S1: Disc bulge. Superimposed broad-based central disc protrusion eccentric to the left. Advanced facet arthropathy. Mild left greater than right subarticular narrowing. No significant central canal stenosis. Bilateral neural foraminal narrowing (severe right, moderate-to-severe left).   IMPRESSION: 1. Spondylosis at the lumbar and visualized lower thoracic levels as outlined within the body of the report. 2. At L4-L5, there is advanced facet arthropathy with multifactorial severe spinal canal stenosis and severe bilateral neural foraminal narrowing. 3. No more than mild central canal or subarticular stenosis at the remaining levels. 4. Additional sites of foraminal stenosis, greatest bilaterally at L5-S1 (severe right, moderate-to-severe left). Advanced facet arthropathy also present at L5-S1. 5. Mild multilevel degenerative endplate edema.     Electronically Signed   By: Rockey Childs D.O.   On: 02/18/2024 09:52     Objective:  VS:  HT:    WT:   BMI:     BP:134/89  HR:90bpm  TEMP: ( )  RESP:  Physical Exam Vitals and nursing note reviewed.  Constitutional:      General: He is not in acute distress.    Appearance: Normal appearance. He is  not ill-appearing.  HENT:     Head: Normocephalic and atraumatic.     Right  Ear: External ear normal.     Left Ear: External ear normal.     Nose: No congestion.   Eyes:     Extraocular Movements: Extraocular movements intact.    Cardiovascular:     Rate and Rhythm: Normal rate.     Pulses: Normal pulses.  Pulmonary:     Effort: Pulmonary effort is normal. No respiratory distress.  Abdominal:     General: There is no distension.     Palpations: Abdomen is soft.   Musculoskeletal:        General: No tenderness or signs of injury.     Cervical back: Neck supple.     Right lower leg: No edema.     Left lower leg: No edema.     Comments: Patient has good distal strength without clonus.   Skin:    Findings: No erythema or rash.   Neurological:     General: No focal deficit present.     Mental Status: He is alert and oriented to person, place, and time.     Sensory: No sensory deficit.     Motor: No weakness or abnormal muscle tone.     Coordination: Coordination normal.   Psychiatric:        Mood and Affect: Mood normal.        Behavior: Behavior normal.      Imaging: No results found.

## 2024-05-01 NOTE — Procedures (Signed)
 Lumbar Epidural Steroid Injection - Interlaminar Approach with Fluoroscopic Guidance  Patient: Anthony Erickson      Date of Birth: 1961-10-10 MRN: [id number] PCP: Antonetta Rollene BRAVO, MD      Visit Date: 05/01/2024   Universal Protocol:     Consent Given By: the patient  Position: PRONE  Additional Comments: Vital signs were monitored before and after the procedure. Patient was prepped and draped in the usual sterile fashion. The correct patient, procedure, and site was verified.   Injection Procedure Details:   Procedure diagnoses: Spinal stenosis of lumbar region with neurogenic claudication [M48.062]   Meds Administered:  Meds ordered this encounter  Medications   methylPREDNISolone  acetate (DEPO-MEDROL ) injection 40 mg     Laterality: Midline  Location/Site:  L4-5  Needle: 4.5 in., 20 ga. Tuohy  Needle Placement: Paramedian epidural  Findings:   -Comments: Excellent flow of contrast into the epidural space.  Procedure Details: Using a paramedian approach from the side mentioned above, the region overlying the inferior lamina was localized under fluoroscopic visualization and the soft tissues overlying this structure were infiltrated with 4 ml. of 1% Lidocaine  without Epinephrine . The Tuohy needle was inserted into the epidural space using a paramedian approach.   The epidural space was localized using loss of resistance along with counter oblique bi-planar fluoroscopic views.  After negative aspirate for air, blood, and CSF, a 2 ml. volume of Isovue -250 was injected into the epidural space and the flow of contrast was observed. Radiographs were obtained for documentation purposes.    The injectate was administered into the level noted above.   Additional Comments:  The patient tolerated the procedure well Dressing: 2 x 2 sterile gauze and Band-Aid    Post-procedure details: Patient was observed during the procedure. Post-procedure instructions were  reviewed.  Patient left the clinic in stable condition.

## 2024-05-10 ENCOUNTER — Ambulatory Visit: Payer: PRIVATE HEALTH INSURANCE | Admitting: Orthopedic Surgery

## 2024-05-22 ENCOUNTER — Ambulatory Visit (INDEPENDENT_AMBULATORY_CARE_PROVIDER_SITE_OTHER): Payer: PRIVATE HEALTH INSURANCE | Admitting: Orthopedic Surgery

## 2024-05-22 DIAGNOSIS — M5416 Radiculopathy, lumbar region: Secondary | ICD-10-CM

## 2024-05-22 NOTE — Progress Notes (Signed)
 Orthopedic Spine Surgery Office Note   Assessment: Patient is a 63 y.o. male with low back pain that radiates into bilateral lower extremities and the dorsal aspect of bilateral feet consistent with lumbar radiculopathy from his stenosis at L4/5     Plan: -Patient has tried PT, tylenol , aleve , prednisone , gabapentin , lumbar steroid injection -Patient has tried multiple conservative treatments over the last 6 months without any relief of his pain, so discussed surgery as an option for him.  Covered his potential surgical options from decompression alone to XLIF with PSIF.  After our conversation, patient elected to proceed with L4/5 XLIF and PSIF. -Patient will next be seen at date of surgery     The patient has symptoms consistent with lumbar radiculopathy. The patient's symptoms were not getting improvement with conservative treatment so operative management was discussed in the form of L4/5 lateral interbody fusion and posterior instrumented spinal fusion for indirect decompression. I told him that if I am unable to safely perform the XLIF due to potential injury to the plexus, would proceed with L4/5 laminectomy and posterior instrumented spinal fusion. The risks including but not limited to dural tear, nerve root injury, paralysis, pseudarthrosis, persistent pain, infection, bleeding, hardware failure, adjacent segment disease, vascular injury, psoas weakness, lumbar plexus injury, heart attack, death, stroke, fracture, and need for additional procedures were discussed with the patient. Since this will be an indirect decompression, there is a risk of persistent symptoms which the patient understood after explaining the reason. The benefit of the surgery would be improvement in the patient's radiating leg pain. I explained that back pain relief is not the goal of the surgery and it is not reliably alleviated with this surgery. The alternatives to surgical management were covered with the patient and  included continued monitoring, physical therapy, over-the-counter pain medications, ambulatory aids, and activity modification. All the patient's questions were answered to his satisfaction. After this discussion, the patient expressed understanding and elected to proceed with surgical intervention.      ___________________________________________________________________________     History:   Patient is a 63 y.o. male who presents today for follow-up on his lumbar spine.  Patient has now had over 6 months of low back pain that radiates into his bilateral lower extremities.  He feels like going in multiple distributions in the leg but the majority of it is over the lateral aspects.  After last visit, he got an injection with Dr. Eldonna and noticed about 85% relief but that only lasted for about 2 weeks and then pain gradually has returned and is similar to how it was before the injection.  He has not noticed any new symptoms since he was last seen in the office.    Treatments tried: PT, tylenol , aleve , prednisone , gabapentin , lumbar steroid injection     Physical Exam:   General: no acute distress, appears stated age Neurologic: alert, answering questions appropriately, following commands Respiratory: unlabored breathing on room air, symmetric chest rise Psychiatric: appropriate affect, normal cadence to speech     MSK (spine):   -Strength exam                                                   Left                  Right EHL  5/5                  5/5 TA                                 5/5                  5/5 GSC                             5/5                  5/5 Knee extension            5/5                  5/5 Hip flexion                    5/5                  5/5   -Sensory exam                           Sensation intact to light touch in L3-S1 nerve distributions of bilateral lower extremities  Imaging: XRs of the lumbar spine from 03/29/2024  were previously independently reviewed and interpreted, showing disc height loss at L1/2 and L2/3. Spondylolisthesis at L4/5. No fracture or dislocation seen.    MRI of the lumbar spine from 02/08/2024 was previously independently reviewed and interpreted, showing central, lateral recess, and bilateral foraminal stenosis at L4/5. Spondylolisthesis at L4/5. Bilateral foraminal stenosis at L5/S1.     Patient name: Anthony Erickson Patient MRN: [id number] Date of visit: 05/22/24

## 2024-05-24 ENCOUNTER — Other Ambulatory Visit (HOSPITAL_COMMUNITY): Payer: Self-pay

## 2024-05-24 ENCOUNTER — Encounter: Payer: Self-pay | Admitting: Orthopedic Surgery

## 2024-05-24 MED ORDER — TRAMADOL HCL 50 MG PO TABS
50.0000 mg | ORAL_TABLET | Freq: Four times a day (QID) | ORAL | 0 refills | Status: AC | PRN
  Filled 2024-05-24: qty 20, 5d supply, fill #0

## 2024-05-31 ENCOUNTER — Encounter: Payer: Self-pay | Admitting: Orthopedic Surgery

## 2024-06-07 ENCOUNTER — Ambulatory Visit (HOSPITAL_COMMUNITY)
Admission: RE | Admit: 2024-06-07 | Discharge: 2024-06-07 | Disposition: A | Discharge status: Home / Self Care | Payer: PRIVATE HEALTH INSURANCE | Source: Ambulatory Visit | Attending: Family Medicine | Admitting: Family Medicine

## 2024-06-07 ENCOUNTER — Ambulatory Visit (INDEPENDENT_AMBULATORY_CARE_PROVIDER_SITE_OTHER): Payer: PRIVATE HEALTH INSURANCE | Admitting: Family Medicine

## 2024-06-07 ENCOUNTER — Encounter: Payer: Self-pay | Admitting: Family Medicine

## 2024-06-07 VITALS — BP 128/82 | HR 98 | Resp 12 | Ht 66.0 in | Wt 251.0 lb

## 2024-06-07 DIAGNOSIS — M5441 Lumbago with sciatica, right side: Secondary | ICD-10-CM

## 2024-06-07 DIAGNOSIS — R7302 Impaired glucose tolerance (oral): Secondary | ICD-10-CM

## 2024-06-07 DIAGNOSIS — Z01818 Encounter for other preprocedural examination: Secondary | ICD-10-CM | POA: Diagnosis present

## 2024-06-07 DIAGNOSIS — I10 Essential (primary) hypertension: Secondary | ICD-10-CM | POA: Insufficient documentation

## 2024-06-07 DIAGNOSIS — Z23 Encounter for immunization: Secondary | ICD-10-CM

## 2024-06-07 DIAGNOSIS — M5442 Lumbago with sciatica, left side: Secondary | ICD-10-CM

## 2024-06-07 DIAGNOSIS — G8929 Other chronic pain: Secondary | ICD-10-CM

## 2024-06-07 NOTE — Patient Instructions (Addendum)
 F/u in 6 months  Pneuonia 20 today  CXR today  Urine is good and also your EKG  Will send my chart message re labs and X ray, if both are normal then you are medically cleared for spine surgery and I will message your Surgeon  Thanks for choosing [facility], we consider it a privelige to serve you.

## 2024-06-08 ENCOUNTER — Encounter: Payer: Self-pay | Admitting: Family Medicine

## 2024-06-08 DIAGNOSIS — Z23 Encounter for immunization: Secondary | ICD-10-CM | POA: Insufficient documentation

## 2024-06-08 DIAGNOSIS — Z01818 Encounter for other preprocedural examination: Secondary | ICD-10-CM | POA: Insufficient documentation

## 2024-06-08 LAB — URINALYSIS
Bilirubin, UA: NEGATIVE
Glucose, UA: NEGATIVE
Ketones, UA: NEGATIVE
Leukocytes,UA: NEGATIVE
Nitrite, UA: NEGATIVE
Protein,UA: NEGATIVE
RBC, UA: NEGATIVE
Specific Gravity, UA: 1.02 (ref 1.005–1.030)
Urobilinogen, Ur: 1 mg/dL (ref 0.2–1.0)
pH, UA: 6 (ref 5.0–7.5)

## 2024-06-08 LAB — CBC WITH DIFFERENTIAL/PLATELET
Basophils Absolute: 0.1 x10E3/uL (ref 0.0–0.2)
Basos: 1 %
EOS (ABSOLUTE): 0.1 x10E3/uL (ref 0.0–0.4)
Eos: 1 %
Hematocrit: 45.1 % (ref 37.5–51.0)
Hemoglobin: 14.7 g/dL (ref 13.0–17.7)
Immature Grans (Abs): 0 x10E3/uL (ref 0.0–0.1)
Immature Granulocytes: 0 %
Lymphocytes Absolute: 3.5 x10E3/uL — ABNORMAL HIGH (ref 0.7–3.1)
Lymphs: 48 %
MCH: 31.3 pg (ref 26.6–33.0)
MCHC: 32.6 g/dL (ref 31.5–35.7)
MCV: 96 fL (ref 79–97)
Monocytes Absolute: 0.7 x10E3/uL (ref 0.1–0.9)
Monocytes: 10 %
Neutrophils Absolute: 2.9 x10E3/uL (ref 1.4–7.0)
Neutrophils: 40 %
Platelets: 227 x10E3/uL (ref 150–450)
RBC: 4.69 x10E6/uL (ref 4.14–5.80)
RDW: 12.4 % (ref 11.6–15.4)
WBC: 7.2 x10E3/uL (ref 3.4–10.8)

## 2024-06-08 LAB — CMP14+EGFR
ALT: 26 IU/L (ref 0–44)
AST: 25 IU/L (ref 0–40)
Albumin: 4.5 g/dL (ref 3.9–4.9)
Alkaline Phosphatase: 98 IU/L (ref 44–121)
BUN/Creatinine Ratio: 14 (ref 10–24)
BUN: 15 mg/dL (ref 8–27)
Bilirubin Total: 0.6 mg/dL (ref 0.0–1.2)
CO2: 18 mmol/L — ABNORMAL LOW (ref 20–29)
Calcium: 9.2 mg/dL (ref 8.6–10.2)
Chloride: 103 mmol/L (ref 96–106)
Creatinine, Ser: 1.05 mg/dL (ref 0.76–1.27)
Globulin, Total: 2.2 g/dL (ref 1.5–4.5)
Glucose: 155 mg/dL — ABNORMAL HIGH (ref 70–99)
Potassium: 3.8 mmol/L (ref 3.5–5.2)
Sodium: 142 mmol/L (ref 134–144)
Total Protein: 6.7 g/dL (ref 6.0–8.5)
eGFR: 80 mL/min/1.73 (ref >59)

## 2024-06-08 LAB — TSH: TSH: 1.42 u[IU]/mL (ref 0.450–4.500)

## 2024-06-08 NOTE — Assessment & Plan Note (Signed)
 After obtaining informed consent, the pneumonia 20  vaccine is  administered , with no adverse effect noted at the time of administration.

## 2024-06-08 NOTE — Assessment & Plan Note (Signed)
 CCUA negative for infection EKG no ischemia, sinus rhythm CBC and bmp within normal History and exam as documented , medically cleared will message Spine surgeon

## 2024-06-08 NOTE — Assessment & Plan Note (Signed)
 Updated lab needed.

## 2024-06-08 NOTE — Progress Notes (Signed)
   IVAL PACER     MRN: [id number]      DOB: 1961/09/28  Chief Complaint  Patient presents with   Pre-op Exam    Surgucal clearance     HPI Anthony Erickson is here for pre op evaluation, surgical clearance for spine surgery  Review of system is positive only for back and lower extremity pain which iss disabling  ROS Denies recent fever or chills. Denies sinus pressure, nasal congestion, ear pain or sore throat. Denies chest congestion, productive cough or wheezing. Denies chest pains, palpitations and leg swelling Denies abdominal pain, nausea, vomiting,diarrhea or constipation.   Denies dysuria, frequency, hesitancy or incontinence. . Denies depression, anxiety or insomnia. Denies skin break down or rash.   PE  BP 128/82   Pulse 98   Resp 12   Ht 5' 6 (1.676 m)   Wt 251 lb (113.9 kg)   SpO2 96%   BMI 40.51 kg/m    Patient alert and oriented and in no cardiopulmonary distress.  HEENT: No facial asymmetry, EOMI,     Neck supple .  Chest: Clear to auscultation bilaterally.  CVS: S1, S2 no murmurs, no S3.Regular rate.  ABD: Soft non tender.   Ext: No edema  MS: Decreased  ROM lumbar spine, adequate in shoulders, hips and knees.  Skin: Intact, no ulcerations or rash noted.  Psych: Good eye contact, normal affect. Memory intact not anxious or depressed appearing.  CNS: CN 2-12 intact, power,  normal throughout.no focal deficits noted.   Assessment & Plan  IGT (impaired glucose tolerance) Updated lab needed   Essential hypertension Controlled, no change in medication DASH diet and commitment to daily physical activity for a minimum of 30 minutes discussed and encouraged, as a part of hypertension management. The importance of attaining a healthy weight is also discussed.     06/08/2024   12:31 PM 05/01/2024    8:30 AM 03/29/2024    1:16 PM 01/20/2024    3:06 PM 11/01/2023    8:52 AM 10/21/2023   10:37 AM 10/05/2023    3:55 PM  BP/Weight  Systolic BP 128  865 148 124 117 154 126  Diastolic BP 82 89 85 81 77 76 75  Wt. (Lbs) 251  251.8 251.12   240  BMI 40.51 kg/m2  39.99 kg/m2 40.53 kg/m2   38.74 kg/m2       Pre-op exam CCUA negative for infection EKG no ischemia, sinus rhythm CBC and bmp within normal History and exam as documented , medically cleared will message Spine surgeon  Chronic right-sided low back pain with bilateral sciatica Planned surgery in near future  Encounter for immunization After obtaining informed consent, the pneumonia 20 vaccine is  administered , with no adverse effect noted at the time of administration.

## 2024-06-08 NOTE — Assessment & Plan Note (Addendum)
 Controlled, no change in medication DASH diet and commitment to daily physical activity for a minimum of 30 minutes discussed and encouraged, as a part of hypertension management. The importance of attaining a healthy weight is also discussed.     06/08/2024   12:31 PM 05/01/2024    8:30 AM 03/29/2024    1:16 PM 01/20/2024    3:06 PM 11/01/2023    8:52 AM 10/21/2023   10:37 AM 10/05/2023    3:55 PM  BP/Weight  Systolic BP 128 134 148 124 117 154 126  Diastolic BP 82 89 85 81 77 76 75  Wt. (Lbs) 251  251.8 251.12   240  BMI 40.51 kg/m2  39.99 kg/m2 40.53 kg/m2   38.74 kg/m2

## 2024-06-08 NOTE — Assessment & Plan Note (Signed)
 Planned surgery in near future

## 2024-06-09 ENCOUNTER — Ambulatory Visit: Payer: Self-pay | Admitting: Family Medicine

## 2024-06-09 LAB — HEMOGLOBIN A1C
Est. average glucose Bld gHb Est-mCnc: 120 mg/dL
Hgb A1c MFr Bld: 5.8 % — ABNORMAL HIGH (ref 4.8–5.6)

## 2024-06-09 LAB — SPECIMEN STATUS REPORT

## 2024-06-10 ENCOUNTER — Encounter: Payer: Self-pay | Admitting: Orthopedic Surgery

## 2024-07-05 ENCOUNTER — Other Ambulatory Visit: Payer: Self-pay | Admitting: Family Medicine

## 2024-07-06 ENCOUNTER — Other Ambulatory Visit (HOSPITAL_COMMUNITY): Payer: Self-pay

## 2024-07-06 ENCOUNTER — Other Ambulatory Visit: Payer: Self-pay

## 2024-07-06 MED ORDER — AMLODIPINE BESYLATE 5 MG PO TABS
5.0000 mg | ORAL_TABLET | Freq: Every day | ORAL | 0 refills | Status: DC
  Filled 2024-07-06: qty 90, 90d supply, fill #0

## 2024-07-25 ENCOUNTER — Encounter: Admitting: Family Medicine

## 2024-07-28 ENCOUNTER — Other Ambulatory Visit (HOSPITAL_BASED_OUTPATIENT_CLINIC_OR_DEPARTMENT_OTHER): Payer: Self-pay

## 2024-08-02 NOTE — Pre-Procedure Instructions (Signed)
 Surgical Instructions   Your procedure is scheduled on August 08, 2024. Report to [facility] Main Entrance A at 10:00 A.M., then check in with the Admitting office. Any questions or running late day of surgery: call [phone]  Questions prior to your surgery date: call [phone], Monday-Friday, 8am-4pm. If you experience any cold or flu symptoms such as cough, fever, chills, shortness of breath, etc. between now and your scheduled surgery, please notify us  at the above number.     Remember:  Do not eat after midnight the night before your surgery  You may drink clear liquids until 9:00 AM the morning of your surgery.   Clear liquids allowed are: Water , Non-Citrus Juices (without pulp), Carbonated Beverages, Clear Tea (no milk, honey, etc.), Black Coffee Only (NO MILK, CREAM OR POWDERED CREAMER of any kind), and Gatorade.  Patient Instructions  The night before surgery:  No food after midnight. ONLY clear liquids after midnight  The day of surgery (if you do NOT have diabetes):  Drink ONE (1) Pre-Surgery Clear Ensure by 9:00 AM the morning of surgery. Drink in one sitting. Do not sip.  This drink was given to you during your hospital  pre-op appointment visit.  Nothing else to drink after completing the  Pre-Surgery Clear Ensure.         If you have questions, please contact your surgeon's office.    Take these medicines the morning of surgery with A SIP OF WATER : amLODipine  (NORVASC )     One week prior to surgery, STOP taking any Aspirin  (unless otherwise instructed by your surgeon) Aleve , Naproxen , Ibuprofen , Motrin , Advil , Goody's, BC's, all herbal medications, fish oil, and non-prescription vitamins.                     Do NOT Smoke (Tobacco/Vaping) for 24 hours prior to your procedure.  If you use a CPAP at night, you may bring your mask/headgear for your overnight stay.   You will be asked to remove any contacts, glasses, piercing's, hearing aid's,  dentures/partials prior to surgery. Please bring cases for these items if needed.    Patients discharged the day of surgery will not be allowed to drive home, and someone needs to stay with them for 24 hours.  SURGICAL WAITING ROOM VISITATION Patients may have no more than 2 support people in the waiting area - these visitors may rotate.   Pre-op nurse will coordinate an appropriate time for 1 ADULT support person, who may not rotate, to accompany patient in pre-op.  Children under the age of 63 must have an adult with them who is not the patient and must remain in the main waiting area with an adult.  If the patient needs to stay at the hospital during part of their recovery, the visitor guidelines for inpatient rooms apply.  Please refer to the [facility] website for the visitor guidelines for any additional information.   If you received a COVID test during your pre-op visit  it is requested that you wear a mask when out in public, stay away from anyone that may not be feeling well and notify your surgeon if you develop symptoms. If you have been in contact with anyone that has tested positive in the last 10 days please notify you surgeon.      Pre-operative 5 CHG Bathing Instructions   You can play a key role in reducing the risk of infection after surgery. Your skin needs to be as free of germs  as possible. You can reduce the number of germs on your skin by washing with CHG (chlorhexidine  gluconate) soap before surgery. CHG is an antiseptic soap that kills germs and continues to kill germs even after washing.   DO NOT use if you have an allergy to chlorhexidine /CHG or antibacterial soaps. If your skin becomes reddened or irritated, stop using the CHG and notify one of our RNs at [phone].   Please shower with the CHG soap starting 4 days before surgery using the following schedule:     Please keep in mind the following:  DO NOT shave, including legs and underarms, starting the  day of your first shower.   You may shave your face at any point before/day of surgery.  Place clean sheets on your bed the day you start using CHG soap. Use a clean washcloth (not used since being washed) for each shower. DO NOT sleep with pets once you start using the CHG.   CHG Shower Instructions:  Wash your face and private area with normal soap. If you choose to wash your hair, wash first with your normal shampoo.  After you use shampoo/soap, rinse your hair and body thoroughly to remove shampoo/soap residue.  Turn the water  OFF and apply about 3 tablespoons (45 ml) of CHG soap to a CLEAN washcloth.  Apply CHG soap ONLY FROM YOUR NECK DOWN TO YOUR TOES (washing for 3-5 minutes)  DO NOT use CHG soap on face, private areas, open wounds, or sores.  Pay special attention to the area where your surgery is being performed.  If you are having back surgery, having someone wash your back for you may be helpful. Wait 2 minutes after CHG soap is applied, then you may rinse off the CHG soap.  Pat dry with a clean towel  Put on clean clothes/pajamas   If you choose to wear lotion, please use ONLY the CHG-compatible lotions that are listed below.  Additional instructions for the day of surgery: DO NOT APPLY any lotions, deodorants, cologne, or perfumes.   Do not bring valuables to the hospital. [facility] is not responsible for any belongings/valuables. Do not wear nail polish, gel polish, artificial nails, or any other type of covering on natural nails (fingers and toes) Do not wear jewelry or makeup Put on clean/comfortable clothes.  Please brush your teeth.  Ask your nurse before applying any prescription medications to the skin.     CHG Compatible Lotions   Aveeno Moisturizing lotion  Cetaphil Moisturizing Cream  Cetaphil Moisturizing Lotion  Clairol Herbal Essence Moisturizing Lotion, Dry Skin  Clairol Herbal Essence Moisturizing Lotion, Extra Dry Skin  Clairol Herbal Essence  Moisturizing Lotion, Normal Skin  Curel Age Defying Therapeutic Moisturizing Lotion with Alpha Hydroxy  Curel Extreme Care Body Lotion  Curel Soothing Hands Moisturizing Hand Lotion  Curel Therapeutic Moisturizing Cream, Fragrance-Free  Curel Therapeutic Moisturizing Lotion, Fragrance-Free  Curel Therapeutic Moisturizing Lotion, Original Formula  Eucerin Daily Replenishing Lotion  Eucerin Dry Skin Therapy Plus Alpha Hydroxy Crme  Eucerin Dry Skin Therapy Plus Alpha Hydroxy Lotion  Eucerin Original Crme  Eucerin Original Lotion  Eucerin Plus Crme Eucerin Plus Lotion  Eucerin TriLipid Replenishing Lotion  Keri Anti-Bacterial Hand Lotion  Keri Deep Conditioning Original Lotion Dry Skin Formula Softly Scented  Keri Deep Conditioning Original Lotion, Fragrance Free Sensitive Skin Formula  Keri Lotion Fast Absorbing Fragrance Free Sensitive Skin Formula  Keri Lotion Fast Absorbing Softly Scented Dry Skin Formula  Keri Original Lotion  Keri Skin Renewal  Lotion WellPoint Smooth Lotion  Keri Silky Smooth Sensitive Skin Lotion  Nivea Body Creamy Conditioning Patent examiner Moisturizing Lotion Nivea Crme  Nivea Skin Firming Lotion  NutraDerm 30 Skin Lotion  NutraDerm Skin Lotion  NutraDerm Therapeutic Skin Cream  NutraDerm Therapeutic Skin Lotion  ProShield Protective Hand Cream  Provon moisturizing lotion  Please read over the following fact sheets that you were given.

## 2024-08-03 ENCOUNTER — Encounter (HOSPITAL_COMMUNITY): Payer: Self-pay

## 2024-08-03 ENCOUNTER — Other Ambulatory Visit: Payer: Self-pay

## 2024-08-03 ENCOUNTER — Encounter (HOSPITAL_COMMUNITY)
Admission: RE | Admit: 2024-08-03 | Discharge: 2024-08-03 | Disposition: A | Discharge status: Home / Self Care | Source: Ambulatory Visit | Attending: Orthopedic Surgery | Admitting: Orthopedic Surgery

## 2024-08-03 VITALS — BP 127/99 | HR 94 | Temp 98.5°F | Resp 18 | Ht 66.0 in | Wt 238.9 lb

## 2024-08-03 DIAGNOSIS — Z01818 Encounter for other preprocedural examination: Secondary | ICD-10-CM

## 2024-08-03 DIAGNOSIS — Z01812 Encounter for preprocedural laboratory examination: Secondary | ICD-10-CM | POA: Insufficient documentation

## 2024-08-03 HISTORY — DX: Prediabetes: R73.03

## 2024-08-03 LAB — BASIC METABOLIC PANEL WITH GFR
Anion gap: 7 (ref 5–15)
BUN: 15 mg/dL (ref 8–23)
CO2: 25 mmol/L (ref 22–32)
Calcium: 8.8 mg/dL — ABNORMAL LOW (ref 8.9–10.3)
Chloride: 105 mmol/L (ref 98–111)
Creatinine, Ser: 0.96 mg/dL (ref 0.61–1.24)
GFR, Estimated: >60 mL/min (ref >60)
Glucose, Bld: 122 mg/dL — ABNORMAL HIGH (ref 70–99)
Potassium: 3.8 mmol/L (ref 3.5–5.1)
Sodium: 137 mmol/L (ref 135–145)

## 2024-08-03 LAB — CBC
HCT: 43.8 % (ref 39.0–52.0)
Hemoglobin: 14.6 g/dL (ref 13.0–17.0)
MCH: 31.5 pg (ref 26.0–34.0)
MCHC: 33.3 g/dL (ref 30.0–36.0)
MCV: 94.4 fL (ref 80.0–100.0)
Platelets: 244 K/uL (ref 150–400)
RBC: 4.64 MIL/uL (ref 4.22–5.81)
RDW: 12.6 % (ref 11.5–15.5)
WBC: 6.4 K/uL (ref 4.0–10.5)
nRBC: 0 % (ref 0.0–0.2)

## 2024-08-03 LAB — SURGICAL PCR SCREEN
MRSA, PCR: NEGATIVE
Staphylococcus aureus: POSITIVE — AB

## 2024-08-03 LAB — TYPE AND SCREEN
ABO/RH(D): O POS
Antibody Screen: NEGATIVE

## 2024-08-03 NOTE — Progress Notes (Signed)
 PCP - Dr. Rollene Pesa Cardiologist -   PPM/ICD - denies Device Orders - na Rep Notified - na  Chest x-ray - 06/07/2024 EKG - 06/07/2024 Stress Test -  ECHO - 10/12/2008 Cardiac Cath -   Sleep Study - Diagnosed with sleep apnea CPAP - does not use  Non-diabetic  Blood Thinner Instructions: denies Aspirin  Instructions:denies  ERAS Protcol - Ensure until 0900  Anesthesia review: No  Patient denies shortness of breath, fever, cough and chest pain at PAT appointment   All instructions explained to the patient, with a verbal understanding of the material. Patient agrees to go over the instructions while at home for a better understanding. Patient also instructed to self quarantine after being tested for COVID-19. The opportunity to ask questions was provided.

## 2024-08-08 ENCOUNTER — Inpatient Hospital Stay (HOSPITAL_COMMUNITY)

## 2024-08-08 ENCOUNTER — Encounter (HOSPITAL_COMMUNITY): Payer: Self-pay | Admitting: Orthopedic Surgery

## 2024-08-08 ENCOUNTER — Encounter (HOSPITAL_COMMUNITY)
Admission: RE | Disposition: A | Discharge status: Home / Self Care | Payer: Self-pay | Source: Home / Self Care | Attending: Orthopedic Surgery

## 2024-08-08 ENCOUNTER — Other Ambulatory Visit: Payer: Self-pay

## 2024-08-08 ENCOUNTER — Other Ambulatory Visit (HOSPITAL_COMMUNITY): Payer: Self-pay | Admitting: Orthopedic Surgery

## 2024-08-08 ENCOUNTER — Inpatient Hospital Stay (HOSPITAL_COMMUNITY): Admitting: Anesthesiology

## 2024-08-08 ENCOUNTER — Inpatient Hospital Stay (HOSPITAL_COMMUNITY)
Admission: RE | Admit: 2024-08-08 | Discharge: 2024-08-11 | DRG: 402 | Disposition: A | Discharge status: Home / Self Care | Attending: Orthopedic Surgery | Admitting: Orthopedic Surgery

## 2024-08-08 DIAGNOSIS — M4316 Spondylolisthesis, lumbar region: Principal | ICD-10-CM | POA: Diagnosis present

## 2024-08-08 DIAGNOSIS — R7303 Prediabetes: Secondary | ICD-10-CM | POA: Diagnosis present

## 2024-08-08 DIAGNOSIS — M5416 Radiculopathy, lumbar region: Secondary | ICD-10-CM

## 2024-08-08 DIAGNOSIS — E785 Hyperlipidemia, unspecified: Secondary | ICD-10-CM | POA: Diagnosis present

## 2024-08-08 DIAGNOSIS — Z981 Arthrodesis status: Secondary | ICD-10-CM

## 2024-08-08 DIAGNOSIS — K219 Gastro-esophageal reflux disease without esophagitis: Secondary | ICD-10-CM | POA: Diagnosis present

## 2024-08-08 DIAGNOSIS — Z8546 Personal history of malignant neoplasm of prostate: Secondary | ICD-10-CM | POA: Diagnosis not present

## 2024-08-08 DIAGNOSIS — Z886 Allergy status to analgesic agent status: Secondary | ICD-10-CM | POA: Diagnosis not present

## 2024-08-08 DIAGNOSIS — Z419 Encounter for procedure for purposes other than remedying health state, unspecified: Secondary | ICD-10-CM

## 2024-08-08 DIAGNOSIS — I10 Essential (primary) hypertension: Secondary | ICD-10-CM | POA: Diagnosis present

## 2024-08-08 DIAGNOSIS — Z01818 Encounter for other preprocedural examination: Principal | ICD-10-CM

## 2024-08-08 HISTORY — PX: ANTERIOR LAT LUMBAR FUSION: SHX1168

## 2024-08-08 LAB — ABO/RH: ABO/RH(D): O POS

## 2024-08-08 SURGERY — ANTERIOR LATERAL LUMBAR FUSION 1 LEVEL
Anesthesia: General | Site: Spine Lumbar

## 2024-08-08 MED ORDER — DEXAMETHASONE SODIUM PHOSPHATE 10 MG/ML IJ SOLN
INTRAMUSCULAR | Status: AC
Start: 2024-08-08 — End: 2024-08-08
  Filled 2024-08-08: qty 1

## 2024-08-08 MED ORDER — ROCURONIUM BROMIDE 10 MG/ML (PF) SYRINGE
PREFILLED_SYRINGE | INTRAVENOUS | Status: AC
  Filled 2024-08-08: qty 10

## 2024-08-08 MED ORDER — KETAMINE HCL 50 MG/5ML IJ SOSY
PREFILLED_SYRINGE | INTRAMUSCULAR | Status: AC
  Filled 2024-08-08: qty 5

## 2024-08-08 MED ORDER — TRANEXAMIC ACID-NACL 1000-0.7 MG/100ML-% IV SOLN
INTRAVENOUS | Status: DC | PRN
  Administered 2024-08-08: 1000 mg via INTRAVENOUS

## 2024-08-08 MED ORDER — METHOCARBAMOL 500 MG PO TABS
500.0000 mg | ORAL_TABLET | Freq: Four times a day (QID) | ORAL | Status: DC
  Administered 2024-08-08 – 2024-08-11 (×10): 500 mg via ORAL
  Filled 2024-08-08 (×10): qty 1

## 2024-08-08 MED ORDER — BUPIVACAINE-EPINEPHRINE 0.25% -1:200000 IJ SOLN
INTRAMUSCULAR | Status: DC | PRN
  Administered 2024-08-08: 30 mL

## 2024-08-08 MED ORDER — OXYCODONE HCL 5 MG/5ML PO SOLN: 5.0000 mg | Freq: Once | ORAL | Status: DC | PRN

## 2024-08-08 MED ORDER — DEXAMETHASONE SODIUM PHOSPHATE 10 MG/ML IJ SOLN
INTRAMUSCULAR | Status: DC | PRN
Start: 2024-08-08 — End: 2024-08-08
  Administered 2024-08-08: 10 mg via INTRAVENOUS

## 2024-08-08 MED ORDER — POVIDONE-IODINE 10 % EX SWAB
2.0000 | Freq: Once | CUTANEOUS | Status: AC
  Administered 2024-08-08: 2 via TOPICAL

## 2024-08-08 MED ORDER — HYDROMORPHONE HCL 1 MG/ML IJ SOLN: 0.2500 mg | INTRAMUSCULAR | Status: DC | PRN

## 2024-08-08 MED ORDER — TRANEXAMIC ACID-NACL 1000-0.7 MG/100ML-% IV SOLN
1000.0000 mg | Freq: Once | INTRAVENOUS | Status: AC
  Administered 2024-08-08: 1000 mg via INTRAVENOUS
  Filled 2024-08-08: qty 100

## 2024-08-08 MED ORDER — PROPOFOL 10 MG/ML IV BOLUS
INTRAVENOUS | Status: DC | PRN
  Administered 2024-08-08: 50 mg via INTRAVENOUS
  Administered 2024-08-08: 110 ug/kg/min via INTRAVENOUS
  Administered 2024-08-08: 150 mg via INTRAVENOUS

## 2024-08-08 MED ORDER — ALBUMIN HUMAN 5 % IV SOLN: INTRAVENOUS | Status: DC | PRN

## 2024-08-08 MED ORDER — CEFAZOLIN SODIUM-DEXTROSE 2-4 GM/100ML-% IV SOLN
2.0000 g | Freq: Four times a day (QID) | INTRAVENOUS | Status: AC
  Administered 2024-08-08 – 2024-08-09 (×2): 2 g via INTRAVENOUS
  Filled 2024-08-08 (×2): qty 100

## 2024-08-08 MED ORDER — METHADONE HCL 10 MG PO TABS
10.0000 mg | ORAL_TABLET | Freq: Once | ORAL | Status: AC
  Administered 2024-08-08: 10 mg via ORAL
  Filled 2024-08-08: qty 1

## 2024-08-08 MED ORDER — PHENYLEPHRINE 80 MCG/ML (10ML) SYRINGE FOR IV PUSH (FOR BLOOD PRESSURE SUPPORT)
PREFILLED_SYRINGE | INTRAVENOUS | Status: AC
  Filled 2024-08-08: qty 10

## 2024-08-08 MED ORDER — ONDANSETRON HCL 4 MG/2ML IJ SOLN
INTRAMUSCULAR | Status: DC | PRN
  Administered 2024-08-08: 4 mg via INTRAVENOUS

## 2024-08-08 MED ORDER — TRANEXAMIC ACID-NACL 1000-0.7 MG/100ML-% IV SOLN
1000.0000 mg | INTRAVENOUS | Status: DC
  Filled 2024-08-08: qty 100

## 2024-08-08 MED ORDER — FENTANYL CITRATE (PF) 250 MCG/5ML IJ SOLN
INTRAMUSCULAR | Status: DC | PRN
  Administered 2024-08-08: 50 ug via INTRAVENOUS
  Administered 2024-08-08: 100 ug via INTRAVENOUS

## 2024-08-08 MED ORDER — ACETAMINOPHEN 500 MG PO TABS
1000.0000 mg | ORAL_TABLET | Freq: Three times a day (TID) | ORAL | Status: DC
  Administered 2024-08-08 – 2024-08-10 (×4): 1000 mg via ORAL
  Filled 2024-08-08 (×5): qty 2

## 2024-08-08 MED ORDER — PHENYLEPHRINE 80 MCG/ML (10ML) SYRINGE FOR IV PUSH (FOR BLOOD PRESSURE SUPPORT)
PREFILLED_SYRINGE | INTRAVENOUS | Status: DC | PRN
  Administered 2024-08-08: 80 ug via INTRAVENOUS

## 2024-08-08 MED ORDER — LACTATED RINGERS IV SOLN: INTRAVENOUS | Status: DC

## 2024-08-08 MED ORDER — POLYETHYLENE GLYCOL 3350 17 G PO PACK
17.0000 g | PACK | Freq: Every day | ORAL | Status: DC
  Administered 2024-08-09 – 2024-08-10 (×2): 17 g via ORAL
  Filled 2024-08-08 (×3): qty 1

## 2024-08-08 MED ORDER — FENTANYL CITRATE (PF) 250 MCG/5ML IJ SOLN
INTRAMUSCULAR | Status: AC
  Filled 2024-08-08: qty 5

## 2024-08-08 MED ORDER — PHENYLEPHRINE HCL (PRESSORS) 10 MG/ML IV SOLN
INTRAVENOUS | Status: AC
  Filled 2024-08-08: qty 1

## 2024-08-08 MED ORDER — PHENYLEPHRINE HCL-NACL 20-0.9 MG/250ML-% IV SOLN
INTRAVENOUS | Status: DC | PRN
  Administered 2024-08-08: 60 ug/min via INTRAVENOUS

## 2024-08-08 MED ORDER — VANCOMYCIN HCL 1000 MG IV SOLR
INTRAVENOUS | Status: AC
  Filled 2024-08-08: qty 20

## 2024-08-08 MED ORDER — MEPERIDINE HCL 25 MG/ML IJ SOLN: 6.2500 mg | INTRAMUSCULAR | Status: DC | PRN

## 2024-08-08 MED ORDER — SODIUM CHLORIDE 0.9 % IV SOLN: INTRAVENOUS | Status: DC | PRN

## 2024-08-08 MED ORDER — DROPERIDOL 2.5 MG/ML IJ SOLN: 0.6250 mg | Freq: Once | INTRAMUSCULAR | Status: DC | PRN

## 2024-08-08 MED ORDER — ONDANSETRON HCL 4 MG/2ML IJ SOLN
4.0000 mg | Freq: Four times a day (QID) | INTRAMUSCULAR | Status: DC | PRN
  Administered 2024-08-10: 4 mg via INTRAVENOUS
  Filled 2024-08-08: qty 2

## 2024-08-08 MED ORDER — OXYCODONE HCL 5 MG PO TABS
5.0000 mg | ORAL_TABLET | ORAL | Status: DC | PRN
  Administered 2024-08-09 – 2024-08-10 (×2): 5 mg via ORAL
  Filled 2024-08-08 (×2): qty 1

## 2024-08-08 MED ORDER — LIDOCAINE 2% (20 MG/ML) 5 ML SYRINGE
INTRAMUSCULAR | Status: AC
  Filled 2024-08-08: qty 5

## 2024-08-08 MED ORDER — KETAMINE HCL 50 MG/5ML IJ SOSY
PREFILLED_SYRINGE | INTRAMUSCULAR | Status: DC | PRN
Start: 2024-08-08 — End: 2024-08-08
  Administered 2024-08-08 (×2): 20 mg via INTRAVENOUS
  Administered 2024-08-08: 10 mg via INTRAVENOUS

## 2024-08-08 MED ORDER — OXYCODONE HCL 5 MG PO TABS: 5.0000 mg | ORAL_TABLET | Freq: Once | ORAL | Status: DC | PRN

## 2024-08-08 MED ORDER — DEXAMETHASONE SODIUM PHOSPHATE 10 MG/ML IJ SOLN
10.0000 mg | Freq: Once | INTRAMUSCULAR | Status: DC
  Filled 2024-08-08: qty 1

## 2024-08-08 MED ORDER — LIDOCAINE 2% (20 MG/ML) 5 ML SYRINGE
INTRAMUSCULAR | Status: DC | PRN
  Administered 2024-08-08: 50 mg via INTRAVENOUS

## 2024-08-08 MED ORDER — 0.9 % SODIUM CHLORIDE (POUR BTL) OPTIME
TOPICAL | Status: DC | PRN
  Administered 2024-08-08: 1000 mL

## 2024-08-08 MED ORDER — BUPIVACAINE-EPINEPHRINE (PF) 0.25% -1:200000 IJ SOLN
INTRAMUSCULAR | Status: AC
  Filled 2024-08-08: qty 30

## 2024-08-08 MED ORDER — SENNA 8.6 MG PO TABS
1.0000 | ORAL_TABLET | Freq: Two times a day (BID) | ORAL | Status: DC
  Administered 2024-08-08 – 2024-08-10 (×5): 8.6 mg via ORAL
  Filled 2024-08-08 (×6): qty 1

## 2024-08-08 MED ORDER — ONDANSETRON HCL 4 MG PO TABS: 4.0000 mg | ORAL_TABLET | Freq: Four times a day (QID) | ORAL | Status: DC | PRN

## 2024-08-08 MED ORDER — PANTOPRAZOLE SODIUM 40 MG PO TBEC
40.0000 mg | DELAYED_RELEASE_TABLET | Freq: Every day | ORAL | Status: DC
  Administered 2024-08-08 – 2024-08-10 (×3): 40 mg via ORAL
  Filled 2024-08-08 (×3): qty 1

## 2024-08-08 MED ORDER — CHLORHEXIDINE GLUCONATE 0.12 % MT SOLN
15.0000 mL | Freq: Once | OROMUCOSAL | Status: AC
  Administered 2024-08-08: 15 mL via OROMUCOSAL
  Filled 2024-08-08: qty 15

## 2024-08-08 MED ORDER — HYDROMORPHONE HCL 1 MG/ML IJ SOLN
0.5000 mg | INTRAMUSCULAR | Status: AC | PRN
  Administered 2024-08-08: 0.5 mg via INTRAVENOUS
  Filled 2024-08-08: qty 0.5

## 2024-08-08 MED ORDER — AMLODIPINE BESYLATE 5 MG PO TABS
5.0000 mg | ORAL_TABLET | Freq: Every day | ORAL | Status: DC
  Administered 2024-08-09 – 2024-08-10 (×2): 5 mg via ORAL
  Filled 2024-08-08 (×2): qty 1

## 2024-08-08 MED ORDER — CEFAZOLIN SODIUM 1 G IJ SOLR
INTRAMUSCULAR | Status: AC
  Filled 2024-08-08: qty 20

## 2024-08-08 MED ORDER — PROPOFOL 10 MG/ML IV BOLUS
INTRAVENOUS | Status: AC
  Filled 2024-08-08: qty 20

## 2024-08-08 MED ORDER — MIDAZOLAM HCL 2 MG/2ML IJ SOLN
INTRAMUSCULAR | Status: AC
  Filled 2024-08-08: qty 2

## 2024-08-08 MED ORDER — ONDANSETRON HCL 4 MG/2ML IJ SOLN
INTRAMUSCULAR | Status: AC
  Filled 2024-08-08: qty 2

## 2024-08-08 MED ORDER — ORAL CARE MOUTH RINSE: 15.0000 mL | Freq: Once | OROMUCOSAL | Status: AC

## 2024-08-08 MED ORDER — ACETAMINOPHEN 10 MG/ML IV SOLN: 1000.0000 mg | Freq: Once | INTRAVENOUS | Status: DC | PRN

## 2024-08-08 MED ORDER — PROPOFOL 1000 MG/100ML IV EMUL
INTRAVENOUS | Status: AC
  Filled 2024-08-08: qty 200

## 2024-08-08 MED ORDER — REMIFENTANIL HCL 2 MG IV SOLR
INTRAVENOUS | Status: DC | PRN
  Administered 2024-08-08: .05 ug/kg/min via INTRAVENOUS

## 2024-08-08 MED ORDER — VANCOMYCIN HCL 1000 MG IV SOLR
INTRAVENOUS | Status: DC | PRN
  Administered 2024-08-08: 1000 mg
  Administered 2024-08-08: 1000 mg via TOPICAL

## 2024-08-08 MED ORDER — SUCCINYLCHOLINE CHLORIDE 200 MG/10ML IV SOSY
PREFILLED_SYRINGE | INTRAVENOUS | Status: DC | PRN
  Administered 2024-08-08: 200 mg via INTRAVENOUS

## 2024-08-08 MED ORDER — LACTATED RINGERS IV SOLN: INTRAVENOUS | Status: DC | PRN

## 2024-08-08 MED ORDER — REMIFENTANIL HCL 2 MG IV SOLR
INTRAVENOUS | Status: AC
  Filled 2024-08-08: qty 2000

## 2024-08-08 MED ORDER — CEFAZOLIN SODIUM-DEXTROSE 2-4 GM/100ML-% IV SOLN
2.0000 g | INTRAVENOUS | Status: AC
  Administered 2024-08-08 (×2): 2 g via INTRAVENOUS
  Filled 2024-08-08: qty 100

## 2024-08-08 SURGICAL SUPPLY — 85 items
ALCOHOL 70% 16 OZ (MISCELLANEOUS) ×3 IMPLANT
BAG COUNTER SPONGE SURGICOUNT (BAG) ×3 IMPLANT
BLADE CLIPPER SURG (BLADE) IMPLANT
BLADE SURG 10 STRL SS (BLADE) IMPLANT
BUR 14 MATCH 3 (BUR) IMPLANT
BUR MR8 14CM BALL SYMTRI 5 (BUR) IMPLANT
BUR NEURO DRILL SOFT 3.0X3.8M (BURR) ×3 IMPLANT
CANISTER SUCTION 3000ML PPV (SUCTIONS) ×3 IMPLANT
CATH FOLEY 2WAY SLVR 5CC 16FR (CATHETERS) ×3 IMPLANT
COVER SURGICAL LIGHT HANDLE (MISCELLANEOUS) ×6 IMPLANT
DERMABOND ADVANCED .7 DNX12 (GAUZE/BANDAGES/DRESSINGS) ×9 IMPLANT
DISSECTOR BLUNT TIP ENDO 5MM (MISCELLANEOUS) ×3 IMPLANT
DRAIN HEMOVAC 7FR (DRAIN) IMPLANT
DRAPE C-ARM 42X72 X-RAY (DRAPES) ×6 IMPLANT
DRAPE C-ARMOR (DRAPES) ×3 IMPLANT
DRAPE INCISE IOBAN 66X45 STRL (DRAPES) ×3 IMPLANT
DRAPE SHEET LG 3/4 BI-LAMINATE (DRAPES) ×18 IMPLANT
DRAPE SURG 17X23 STRL (DRAPES) ×12 IMPLANT
DRAPE UTILITY XL STRL (DRAPES) ×9 IMPLANT
DRESSING MEPILEX FLEX 4X4 (GAUZE/BANDAGES/DRESSINGS) ×12 IMPLANT
DRSG COVADERM 4X10 (GAUZE/BANDAGES/DRESSINGS) ×6 IMPLANT
DRSG MEPILEX POST OP 4X8 (GAUZE/BANDAGES/DRESSINGS) IMPLANT
DRSG TEGADERM 4X10 (GAUZE/BANDAGES/DRESSINGS) ×6 IMPLANT
DRSG TEGADERM 4X4.75 (GAUZE/BANDAGES/DRESSINGS) ×12 IMPLANT
DURAPREP 26ML APPLICATOR (WOUND CARE) ×6 IMPLANT
ELECT COATED BLADE 2.86 ST (ELECTRODE) ×6 IMPLANT
ELECT NVM5 SURFACE MEP/EMG (ELECTRODE) IMPLANT
ELECT PENCIL ROCKER SW 15FT (MISCELLANEOUS) ×6 IMPLANT
ELECTRODE REM PT RTRN 9FT ADLT (ELECTROSURGICAL) ×6 IMPLANT
FEE COVERAGE SUPPORT O-ARM (MISCELLANEOUS) ×3 IMPLANT
GAUZE 4X4 16PLY ~~LOC~~+RFID DBL (SPONGE) ×3 IMPLANT
GAUZE SPONGE 4X4 12PLY STRL (GAUZE/BANDAGES/DRESSINGS) ×3 IMPLANT
GLOVE BIO SURGEON STRL SZ7.5 (GLOVE) ×6 IMPLANT
GLOVE BIOGEL PI IND STRL 7.0 (GLOVE) ×3 IMPLANT
GLOVE BIOGEL PI IND STRL 7.5 (GLOVE) IMPLANT
GLOVE ECLIPSE 7.5 STRL STRAW (GLOVE) IMPLANT
GLOVE INDICATOR 7.5 STRL GRN (GLOVE) ×9 IMPLANT
GLOVE INDICATOR 8.0 STRL GRN (GLOVE) ×3 IMPLANT
GLOVE SS BIOGEL STRL SZ 7.5 (GLOVE) ×12 IMPLANT
GOWN STRL REUS W/ TWL LRG LVL3 (GOWN DISPOSABLE) ×6 IMPLANT
GOWN STRL SURGICAL XL XLNG (GOWN DISPOSABLE) ×9 IMPLANT
GRAFT BNE FBR PLIAFX PRIME 5 (Bone Implant) IMPLANT
KIT BASIN OR (CUSTOM PROCEDURE TRAY) ×6 IMPLANT
KIT DILATOR XLIF 5 (KITS) IMPLANT
KIT POSITIONER JACKSON TABLE (MISCELLANEOUS) ×3 IMPLANT
KIT SURGICAL ACCESS MAXCESS 4 (KITS) IMPLANT
KIT TURNOVER KIT B (KITS) ×6 IMPLANT
MARKER SKIN DUAL TIP RULER LAB (MISCELLANEOUS) ×6 IMPLANT
MARKER SPHERE PSV REFLC NDI (MISCELLANEOUS) ×15 IMPLANT
MODULE EMG NDL SSEP NVM5 (NEUROSURGERY SUPPLIES) IMPLANT
MODULE EMG NEEDLE SSEP NVM5 (NEUROSURGERY SUPPLIES) ×2 IMPLANT
NDL 22X1.5 STRL (OR ONLY) (MISCELLANEOUS) ×3 IMPLANT
NEEDLE 22X1.5 STRL (OR ONLY) (MISCELLANEOUS) ×2 IMPLANT
PACK LAMINECTOMY ORTHO (CUSTOM PROCEDURE TRAY) ×6 IMPLANT
PACK UNIVERSAL I (CUSTOM PROCEDURE TRAY) ×3 IMPLANT
PAD ARMBOARD POSITIONER FOAM (MISCELLANEOUS) ×6 IMPLANT
PLATE ADIRA 1H 9.5 STATIC (Plate) IMPLANT
PLATE ADIRA 1H 9.5 UP RLX (Plate) IMPLANT
PUTTY DBM INSTAFILL CART 5CC (Putty) IMPLANT
ROD RELINE MAS LORD 5.5X45MM (Rod) IMPLANT
SCREW ALIGN ADIRA M4.5 GRN (Screw) IMPLANT
SCREW ANT CERV SD IND 5.5X40 (Screw) IMPLANT
SCREW LOCK RELINE 5.5 TULIP (Screw) IMPLANT
SCREW RED RELINE 7.5X45MM POLY (Screw) IMPLANT
SOLN 0.9% NACL 1000 ML (IV SOLUTION) ×4 IMPLANT
SOLN 0.9% NACL POUR BTL 1000ML (IV SOLUTION) ×6 IMPLANT
SOLN STERILE WATER 1000 ML (IV SOLUTION) ×4 IMPLANT
SOLN STERILE WATER BTL 1000 ML (IV SOLUTION) ×6 IMPLANT
SPACER LORD RISE-L 18X60 3-15D (Spacer) IMPLANT
SPONGE SURGIFOAM ABS GEL 100 (HEMOSTASIS) IMPLANT
SPONGE T-LAP 4X18 ~~LOC~~+RFID (SPONGE) ×6 IMPLANT
SUCTION TUBE FRAZIER 10FR DISP (SUCTIONS) ×3 IMPLANT
SUT BONE WAX W31G (SUTURE) ×3 IMPLANT
SUT MNCRL AB 3-0 PS2 27 (SUTURE) ×6 IMPLANT
SUT VIC AB 0 CT1 18XCR BRD8 (SUTURE) ×9 IMPLANT
SUT VIC AB 2-0 CT1 18 (SUTURE) ×9 IMPLANT
SUT VIC AB 2-0 CT2 18 VCP726D (SUTURE) ×6 IMPLANT
SUT VIC AB 3-0 PS2 18XBRD (SUTURE) ×3 IMPLANT
SYR BULB IRRIG 60ML STRL (SYRINGE) ×3 IMPLANT
SYR CONTROL 10ML LL (SYRINGE) ×3 IMPLANT
TAPE CLOTH 4X10 WHT NS (GAUZE/BANDAGES/DRESSINGS) ×6 IMPLANT
TIP CONICAL INSTAFILL (ORTHOPEDIC DISPOSABLE SUPPLIES) IMPLANT
TOWEL GREEN STERILE (TOWEL DISPOSABLE) ×6 IMPLANT
TOWEL GREEN STERILE FF (TOWEL DISPOSABLE) ×6 IMPLANT
TUBING FEATHERFLOW (TUBING) ×3 IMPLANT

## 2024-08-08 NOTE — Op Note (Signed)
 Orthopedic Spine Surgery Operative Report  Procedure: L4/5 lateral lumbar interbody arthrodesis L4/5 insertion of biomechanical interbody device Application of demineralized bone matrix  Modifier: none  Date of procedure: 08/08/2024  Patient name: Anthony Erickson MRN: [id number] DOB: 1961-10-03  Surgeon: Ozell Ada, MD Assistant: none Pre-operative diagnosis: lumbar radiculopathy Post-operative diagnosis: same as above Findings: L4/5 spondylolisthesis, loss of disc height at L4/5  Specimens: none Anesthesia: general EBL: 50cc Complications: tear of the ALL Pre-incision antibiotic: ancef  TXA was given prior to incision as well  Implants:  Rise L expandable lateral interbody cage (18x60) Side plate with 59ffk4.4 screw in the interbody cage Demineralized bone matrix   Indication for procedure: Patient is a 63 y.o. male who presented to the office with symptoms consistent with lumbar radiculopathy. The patient had tried conservative treatments that did not provide any lasting relief. As result, operative management was discussed. In order to address the symptoms, lateral lumbar interbody arthrodesis at L4/5 was discussed as a part of one of the treatment options. The risks including but not limited to dural tear, nerve root injury, paralysis, pseudarthrosis, persistent pain, infection, bleeding, hardware failure, adjacent segment disease, vascular injury, psoas weakness, lumbar plexus injury, heart attack, death, stroke, fracture, and need for additional procedures were discussed with the patient. Since this will be an indirect decompression, there is a risk of persistent symptoms which the patient understood after explaining the reason. The benefit of the surgery would be improvement in the patient's radiating leg pain. The alternatives to surgical management were covered with the patient and included continued monitoring, physical therapy, over-the-counter pain medications, ambulatory  aids, and activity modification. All the patient's questions were answered to his satisfaction. After this discussion, the patient expressed understanding and elected to proceed with surgical intervention.  Procedure Description: The patient was met in the pre-operative holding area. The patient's identity and consent were verified. The operative site was marked. The patient's remaining questions about the surgery were answered. The patient was brought back to the operating room. General anesthesia was induced and an endotracheal tube was placed by the anesthesia staff. Neuromonitoring leads were attached by the technologist. The patient was transferred to the proaxis table in the lateral position. Axillary roll was placed. All bony prominences were well padded. The surgical area was cleansed with alcohol. Baseline neuromonitoring signals were obtained. Fluoroscopy was then brought in to check rotation on the AP image and to mark the anterior, posterior 1/3, and posterior aspects of the disc space on the lateral image. The patient's skin was then prepped and draped in a standard, sterile fashion. A time out was performed that identified the patient, the procedure, and the operative levels. All team members agreed with what was stated in the time out.  A transverse incision over the abdominal wall was made over the previously marked disc space through the skin and dermis. Electrocautery was used to continue the dissection through the subcutaneous tissue and fat to the level of the fascia overlying the external oblique muscle. Electrocautery was used to make an incision through the fascia in line with the muscle fibers. Metzenbaum scissors were used to dissect in line with the muscle fibers of the external oblique, the internal oblique, and then the transversus abdominis muscles. Metzenbaum scissors were used to open the transversalis fascia. A sponge stick and long kidner were utilized to sweep the  retroperitoneal fat ventral to eventually visualize the psoas muscle.   The initial dilator was placed on the psoas fibers. An AP  and lateral fluoroscopic image was taken to confirm dilator was at correct level and in proper position. The dilator was stimulated at that level and all readings were over 20. The dilator was then rotated to bluntly advance it through the psoas muscle. The position of the dilator was checked and adjusted under fluoroscopy until it was in the proper position near the posterior 1/3 of the disc space. The dilator was again stimulated and all readings were over 20. A K wire was advanced through the dilator into the disc space to secure the dilator's position. Sequential dilators were placed over the initial dilator and rotated to bluntly dissect to the level of the disc space. Each time a new dilator was placed, it was stimulated in all directions and no readings were under 20. The retractor was then advanced over the dilators to the level of the disc space. Fluoroscopy was used to adjust and align the retractor to be parallel to the disc space. The retractor was locked into place using the bed attachment. The position of the retractor was checked again on AP and lateral fluoroscopy. The retractor was in a satisfactory position. The stimulator probe was used to test in all directors, including under the posterior retractor blade, and no reading was under 15. The shim was placed and advanced down the retractor into the disc space. The dilators and K wire were removed. The cranial/caudal aspect of the retractor was opened and the anterior blade of the retractor was advanced ventrally by six clicks. A bipolar and long kidner were used to remove the remaining soft tissue off of the disc space. A rectangular annulotomy was made in the disc space. A pituitary was used to remove some of the disc. A Cobb was placed into the disc space and advanced along the endplate to clear cartilaginous and disc  material off of the endplate under fluoroscopic guidance. The Cobb was impacted to advance it through the annulus on the contralateral side. The Cobb was rotated and the other end plate was prepared in the same fashion. More disc was removed with a pituitary. Then, a combination of ringer curettes, straight curettes, curved curettes, and pituitaries were used to remove the remaining disc until the endplates were well prepared with cartilaginous and disc material removed off of them. A series of trials were advanced across the disc space under AP fluoroscopic guidance until a good fit was obtained. Neuromonitoring signals were checked and were unchanged from baseline. This size interbody was selected for implantation (see implant section above for specifics). Demineralized bone matrix was packed into interbody device. The interbody device was placed into the disc space and advanced under fluoroscopic guidance until it was across the disc space. It was going quite easily across the disc space. AP and lateral fluoroscopic images were taken and showed that the anterior aspect of the L4/5 disc space was gapped and the cage was further anterior than the anterior edge of the L5 vertebra. It was still posterior to the L4 vertebra due to the spondylolisthesis. My concern was that the ALL had ruptured and the cage was anterior to L5 so I decided to revise it. The cage was removed. The shim was removed out of the retractor. The retractor was under psoas and was then manipulated further posteriorly. It was locked into place with the bed attachment. The stimulator probe was used again before placing the shim. No place in the area of the shim stimulated under 16. The shim was then advanced through  the retractor. A long handle knife was used to incise more of the annulus posterior the initial disc prep. A combination of a pituitary, ringed currette, and angled currette were used to prepare the more posterior aspect of the disc  space. The cage that had been packed with demineralized bone matrix was then impacted into the L4/5 disc space more posterior this time. The cage was expanded until it reached its reached its torque limit. AP and lateral fluoroscopic images were obtained which showed satisfactory position of the cage. With the suspected ALL tear, decision was made to add a plate. The plate was placed on the side of the cage and then screwed into the plate. A awl was used to create a hole in the L4 vertebra through the plate. A 40mm screw was then inserted through the plate and the locking mechanism was tightened over it.  Neuromonitoring signals were checked and were unchanged from baseline. The retractor blades were then closed. The retractor was then slowly removed and any bleeding was coagulated with a bipolar. Final AP and lateral fluoroscopic images were taken and confirmed satisfactory position of the implant.   The transversalis fascia was reapproximated with 0 vicryl. The internal and external oblique muscular layers were reapproximated with 0 vicryl. The fascia overlying the external oblique muscle was reapproximated with 0 vicryl. The deep dermal layer was reapproximated with 2-0 vicryl. The skin was closed with 3-0 monocryl. The incision was dressed with dermabond and an island dressing.   Post-operative plan: The patient remained intubated and in the operating room after this procedure as there was plan for further posterior surgery. The drapes were left in place since the posterior spine was draped in at the beginning of this procedure. The posterior portion of the case was completed in the same trip to the operating room and will be dictated separately.   Ozell Ada, MD Orthopedic Surgeon

## 2024-08-08 NOTE — Progress Notes (Signed)
 Orthopedic Surgery Post-operative Progress Note  Assessment: Patient is a 63 y.o. male who is currently admitted after undergoing L4/5 XLIF and PSIF   Plan: -Operative plans complete -Needs upright films when able -Drain: none -Out of bed as tolerated, no brace -No bending/lifting/twisting greater than 10 pounds -OT evaluate and treat -Pain control -Diabetic diet (I know patient is not diabetic but [facility] diabetic still allow for orange juice, cake, and pancakes so I feel that he will still get enough carbohydrates post-operatively)   -No chemoprophylaxis for dvt or antiplatelets for 72 hours after surgery -Ancef  x2 post-operative doses -Disposition: remain floor status  ___________________________________________________________________________   Subjective: No acute events since surgery. Pain well controlled. No radiating leg pain.   Objective:  General: no acute distress, appropriate affect Neurologic: alert, answering questions appropriately, following commands Respiratory: unlabored breathing on room air Skin: dressings clear/dry/intact  MSK (spine):  -Strength exam      Right  Left  EHL    5/5  5/5 TA    5/5  5/5 GSC    5/5  5/5 Knee extension  5/5  5/5 Hip flexion   5/5  5/5  -Sensory exam    Sensation intact to light touch in L3-S1 nerve distributions of bilateral lower extremities   Yesterday's total administered Morphine Milligram Equivalents: 0   Patient name: Anthony Erickson Patient MRN: [id number] Date: 08/08/24

## 2024-08-08 NOTE — H&P (Signed)
 Orthopedic Spine Surgery H&P Note  Assessment: Patient is a 63 y.o. male with lumbar radiculopathy   Plan: -Out of bed as tolerated, activity as tolerated, no brace -Covered the risks of surgery one more time with the patient and patient elected to proceed with planned surgery -Written consent verified -Hold anticoagulation in anticipation of surgery -Ancef  and TXA on all to OR -NPO for procedure -Site marked -To OR when ready  The risks covered with the patient today included but were not limited to: dural tear, nerve root injury, paralysis, pseudarthrosis, persistent pain, dvt/pe, infection, bleeding, hardware failure, adjacent segment disease, vascular injury, psoas weakness, lumbar plexus injury, heart attack, death, stroke, fracture, and need for additional procedures. ___________________________________________________________________________  Chief Complaint: low back pain that radiates into bilateral lower extremities  History: Patient is 63 y.o. male who has been previously seen in the office for low back pain that radiates into bilateral lateral thighs and legs. His work up was consistent with lumbar radiculopathy. His symptoms failed to improve with conservative treatment so operative management was discussed at the last office visit. The patient presents today with no changes in his symptoms since the last office visit. See previous office note for further details.    Review of systems: General: denies fevers and chills, myalgias Neurologic: denies recent changes in vision, slurred speech Abdomen: denies nausea, vomiting, hematemesis Respiratory: denies cough, shortness of breath  Past medical history: GERD HLD HTN Prostate cancer   Allergies: diclofenac , meloxicam    Past surgical history:  Appendectomy Cheilectomy Cholecystectomy Polypectomy Prostate seed implantation   Social history: Denies use of nicotine product (smoking, vaping, patches,  smokeless) Alcohol use: rare Denies recreational drug use  Family history: -reviewed and not pertinent to lumbar radiculopathy   Physical Exam:  General: no acute distress, appears stated age Neurologic: alert, answering questions appropriately, following commands Cardiovascular: regular rate, no cyanosis Respiratory: unlabored breathing on room air, symmetric chest rise Psychiatric: appropriate affect, normal cadence to speech   MSK (spine):  -Strength exam      Left  Right  EHL    5/5  5/5 TA    5/5  5/5 GSC    5/5  5/5 Knee extension  5/5  5/5 Knee flexion   5/5  5/5 Hip flexion   5/5  5/5  -Sensory exam    Sensation intact to light touch in L3-S1 nerve distributions of bilateral lower extremities   Patient name: Anthony Erickson Patient MRN: [id number] Date: 08/08/24

## 2024-08-08 NOTE — Anesthesia Procedure Notes (Signed)
 Procedure Name: Intubation Date/Time: 08/08/2024 2:08 PM  Performed by: Obadiah Reyes BROCKS, CRNAPre-anesthesia Checklist: Patient identified, Emergency Drugs available, Suction available and Patient being monitored Patient Re-evaluated:Patient Re-evaluated prior to induction Oxygen Delivery Method: Circle System Utilized Preoxygenation: Pre-oxygenation with 100% oxygen Induction Type: IV induction Ventilation: Mask ventilation without difficulty Laryngoscope Size: Pore and 2 Grade View: Grade II Tube type: Oral Tube size: 7.5 mm Number of attempts: 1 Airway Equipment and Method: Stylet and Oral airway Placement Confirmation: ETT inserted through vocal cords under direct vision, positive ETCO2 and breath sounds checked- equal and bilateral Secured at: 23 cm Tube secured with: Tape Dental Injury: Teeth and Oropharynx as per pre-operative assessment

## 2024-08-08 NOTE — Anesthesia Preprocedure Evaluation (Addendum)
 Anesthesia Evaluation  Patient identified by MRN, date of birth, ID band Patient awake    Reviewed: Allergy & Precautions, NPO status , Patient's Chart, lab work & pertinent test results  Airway Mallampati: III  TM Distance: <3 FB Neck ROM: Full    Dental  (+) Poor Dentition, Chipped, Missing, Loose, Dental Advisory Given   Pulmonary sleep apnea    breath sounds clear to auscultation       Cardiovascular hypertension, Pt. on medications  Rhythm:Regular Rate:Normal     Neuro/Psych  Headaches  Anxiety      Neuromuscular disease    GI/Hepatic Neg liver ROS,GERD  Medicated,,  Endo/Other  negative endocrine ROS    Renal/GU negative Renal ROS     Musculoskeletal  (+) Arthritis ,    Abdominal   Peds  Hematology negative hematology ROS (+)   Anesthesia Other Findings   Reproductive/Obstetrics                              Anesthesia Physical Anesthesia Plan  ASA: 2  Anesthesia Plan: General   Post-op Pain Management: Tylenol  PO (pre-op)* and Toradol  IV (intra-op)*   Induction: Intravenous  PONV Risk Score and Plan: 3 and Ondansetron , Dexamethasone  and Midazolam   Airway Management Planned: Oral ETT  Additional Equipment: Arterial line  Intra-op Plan:   Post-operative Plan: Extubation in OR  Informed Consent: I have reviewed the patients History and Physical, chart, labs and discussed the procedure including the risks, benefits and alternatives for the proposed anesthesia with the patient or authorized representative who has indicated his/her understanding and acceptance.     Dental advisory given  Plan Discussed with: CRNA  Anesthesia Plan Comments:          Anesthesia Quick Evaluation

## 2024-08-08 NOTE — Transfer of Care (Signed)
 Immediate Anesthesia Transfer of Care Note  Patient: Anthony Erickson  Procedure(s) Performed: ANTERIOR LATERAL LUMBAR FUSION LUMBAR FOUR-FIVE POSTERIOR INSTRUMENTED SPINAL FUSION (Spine Lumbar) POSTERIOR LUMBAR FUSION FOUR-FIVE (Spine Lumbar) APPLICATION OF O-ARM  Patient Location: PACU  Anesthesia Type:General  Level of Consciousness: drowsy  Airway & Oxygen Therapy: Patient Spontanous Breathing and Patient connected to face mask oxygen  Post-op Assessment: Report given to RN and Post -op Vital signs reviewed and stable  Post vital signs: Reviewed and stable  Last Vitals:  Vitals Value Taken Time  BP 134/90 08/08/24 21:00  Temp 36.7 C 08/08/24 21:00  Pulse 96 08/08/24 21:07  Resp 20 08/08/24 21:07  SpO2 96 % 08/08/24 21:07  Vitals shown include unfiled device data.  Last Pain:  Vitals:   08/08/24 2100  TempSrc:   PainSc: Asleep         Complications: No notable events documented.

## 2024-08-08 NOTE — Anesthesia Procedure Notes (Signed)
 Arterial Line Insertion Start/End10/05/2024 2:15 PM, 08/08/2024 2:18 PM Performed by: Tilford Franky BIRCH, MD, anesthesiologist  Patient location: Pre-op. Preanesthetic checklist: patient identified, IV checked, site marked, risks and benefits discussed, surgical consent, monitors and equipment checked, pre-op evaluation, timeout performed and anesthesia consent Lidocaine  1% used for infiltration Left, radial was placed Catheter size: 20 G Hand hygiene performed  and maximum sterile barriers used   Attempts: 1 Following insertion, dressing applied and Biopatch. Post procedure assessment: normal and unchanged  Patient tolerated the procedure well with no immediate complications.

## 2024-08-08 NOTE — Op Note (Signed)
 Orthopedic Spine Surgery Operative Report   Procedure: L4/5 posterior lumbar spinal arthrodesis L4, L5 pedicle screw instrumentation with rods Intraoperative use of stereotactic computer-assisted navigation Application of demineralized bone matrix   Modifier: none   Date of procedure: 07/21/2024   Patient name: Anthony Erickson MRN: [id number] DOB: September 27, 1961   Surgeon: Ozell Ada, MD Assistant: none Pre-operative diagnosis: lumbar radiculopathy Post-operative diagnosis: same as above Findings: hypertrophic L4/5 facets and a L4/5 spondylolisthesis   Specimens: none Anesthesia: general EBL: 200cc Complications: none Both ancef  and TXA had been given prior to the anterior portion of the procedure. The ancef  was redosed prior to the incision for the posterior portion   Implants:  7.5x38mm Reline screws (x4) 5.5x50mm titanium rods (x2) Demineralized bone matrix     Indication for procedure: Patient is a 63 y.o. male who who had been seen in the office for symptoms consistent with lumbar radiculopathy.  The patient had tried conservative treatments but did not notice any lasting relief with those treatments.  As a result, operative management was discussed with the patient.  A L4/5 lumbar interbody fusion with posterior instrumented spinal fusion was discussed with the patient as a treatment option.  The risks, benefits, alternatives of the proposed procedure were covered with the patient.  After this conversation, the patient elected to proceed with surgery.    Procedure Description: At this point, the patient was already in the operating room.  The surgical site had already been marked, the patient's consent had been verified, and the patient's identity had been confirmed.  The patient was intubated and anesthesia had already been induced.  Patient was moved from the operating table to a stretcher. The patient was then repositioned prone on the pro axis table.  All bony prominences  were well-padded.  The head of the bed was slightly elevated and the eyes were free from compression by the face pillow. The surgical area was cleansed with alcohol. The patient's skin was then prepped and draped in a standard, sterile fashion. A time out was performed that identified the patient, the procedure, and the operative levels. All team members agreed with what was stated in the time out.    A stab incision was made over the left PSIS.  This incision was taken sharply down through skin and dermis and fascia.  A percutaneous pin was then impacted into place in the PSIS and ilium.  A reference array was attached to the percutaneous pin.  Drapes were applied around the patient.  A green towel was placed over the reference array.  The O-arm was brought in and closed.  The green towel was removed off of the reference array.  An intraoperative CT scan was obtained.  The additional drapes that were placed to cover the patient while the O-arm was in place were then were removed.   The pedicle screws were placed next. The same steps were repeated for each of the pedicle screws.  See the steps and the below paragraph for pedicle screw placement.   The navigational probe was placed over the skin in the area of the pedicle.  Navigation was used to find the skin overlying the pedicle start point.  A stab incision was made and carried sharply down through the skin, dermis, and fascia where the pedicle start point was found with the navigational probe.  The navigated bur was then placed into the wound and at the pedicle start point.  The bur was advanced into the pedicle under navigational guidance.  A 6.5 mm tap was then placed into the wound and into the previously created track with the bur.  The tap was then advanced into the pedicle and vertebral body under navigational guidance.  The navigational probe was used to palpate the hole created with the awl.  No breaches were felt within the pedicle.  A 7.5x45 mm  screw was then inserted under navigational guidance into the pedicle until there was good purchase.  As stated above, this process was repeated for each of the L4 and L5 pedicle screws placed. Pedicle screws were placed on both sides at L4 and L5.   Once the screws were placed, the navigational bur was used to decorticate the L4/5 facet joints.  The bur was placed into the previously made incision for the L5 pedicle screw.  It was then placed at the L4/5 facet joint under navigational guidance.  The bur was then used under navigational guidance to decorticate the superior and inferior facets at L4/5.  The same process was repeated for the contralateral side to decorticate both the L4/5 facet joints.   Next, rods were placed.  A caliper was placed into the most cranial and most caudal screws on each side and was used to estimate the rod length for both sides. The rods were inserted through the previously made wounds and placed into the L4 and L5 pedicle screws.  The same size rods were used on both sides.  Once the rods were contained within the pedicle screws, set caps were advanced over the pedicle screws to secure the rods in place. AP and lateral fluoroscopic images confirm satisfactory position of the screws and rods.  The set caps on all the pedicle screws were then final tightened into place. The tabs on the percutaneous pedicle screws were then broken off and removed.  The reference array was removed from the percutaneous pin.  The percutaneous pin was back slapped out of the PSIS and ilium.   Final AP and lateral fluoroscopic films were obtained which confirmed satisfactory position of all implants.  Pliafix demineralized bone matrix was then packed into the area of the decorticated L4/5 facet joints bilaterally.  30 cc of Marcaine  with epinephrine  was injected into the wounds.  Vancomycin powder was placed into all of the wounds.  The fascial layer was reapproximated with 0 Vicryl suture.  The deep  dermal layer was reapproximated with 2-0 Vicryl at all wounds.  The skin was closed with 3-0 Monocryl in all wounds.  All counts were correct at the end of the case.  Dermabond was applied over the skin at all the incision.  Island dressings were placed over the incisions.  The patient was transferred back to a bed and brought to the postanesthesia care unit by anesthesia staff in stable condition.   Post-operative plan: The patient will recover in the post-anesthesia care unit and then go to the floor. The patient will receive two post-operative doses of ancef  and will get another dose of TXA. The patient will be out of bed as tolerated with no brace. The patient will work with physical therapy. The patient will likely discharge to home in the next couple of days.     Ozell Ada, MD Orthopedic Surgeon

## 2024-08-09 ENCOUNTER — Inpatient Hospital Stay (HOSPITAL_COMMUNITY)

## 2024-08-09 LAB — CBC
HCT: 41.1 % (ref 39.0–52.0)
Hemoglobin: 13.9 g/dL (ref 13.0–17.0)
MCH: 31.7 pg (ref 26.0–34.0)
MCHC: 33.8 g/dL (ref 30.0–36.0)
MCV: 93.8 fL (ref 80.0–100.0)
Platelets: 206 K/uL (ref 150–400)
RBC: 4.38 MIL/uL (ref 4.22–5.81)
RDW: 12.3 % (ref 11.5–15.5)
WBC: 9.6 K/uL (ref 4.0–10.5)
nRBC: 0 % (ref 0.0–0.2)

## 2024-08-09 LAB — BASIC METABOLIC PANEL WITH GFR
Anion gap: 11 (ref 5–15)
BUN: 11 mg/dL (ref 8–23)
CO2: 23 mmol/L (ref 22–32)
Calcium: 9 mg/dL (ref 8.9–10.3)
Chloride: 106 mmol/L (ref 98–111)
Creatinine, Ser: 0.91 mg/dL (ref 0.61–1.24)
GFR, Estimated: >60 mL/min (ref >60)
Glucose, Bld: 131 mg/dL — ABNORMAL HIGH (ref 70–99)
Potassium: 4.2 mmol/L (ref 3.5–5.1)
Sodium: 140 mmol/L (ref 135–145)

## 2024-08-09 NOTE — Progress Notes (Signed)
 Orthopedic Surgery Post-operative Progress Note  Assessment: Patient is a 63 y.o. male who is currently admitted after undergoing L4/5 XLIF and PSIF   Plan: -Operative plans complete -Needs upright films when able -Drain: none -Out of bed as tolerated, no brace -No bending/lifting/twisting greater than 10 pounds -OT evaluate and treat -Pain control -Diabetic diet (I know patient is not diabetic but [facility] diabetic still allow for orange juice, cake, and pancakes so I feel that he will still get enough carbohydrates post-operatively)   -No chemoprophylaxis for dvt or antiplatelets for 72 hours after surgery -Ancef  x2 post-operative doses -Disposition: remain floor status  ___________________________________________________________________________   Subjective: No acute events overnight. Reports pain is well controlled. No radiating leg pain.   Objective:  General: no acute distress, appropriate affect Neurologic: alert, answering questions appropriately, following commands Respiratory: unlabored breathing on room air Skin: dressings clear/dry/intact  MSK (spine):  -Strength exam      Right  Left  EHL    5/5  5/5 TA    5/5  5/5 GSC    5/5  5/5 Knee extension  5/5  5/5 Hip flexion   5/5  5/5  -Sensory exam    Sensation intact to light touch in L3-S1 nerve distributions of bilateral lower extremities   Yesterday's total administered Morphine Milligram Equivalents: 95   Patient name: Anthony Erickson Patient MRN: [id number] Date: 08/09/24

## 2024-08-09 NOTE — Evaluation (Signed)
 Occupational Therapy Evaluation Patient Details Name: Anthony Erickson MRN: [id number] DOB: Jan 19, 1961 Today's Date: 08/09/2024   History of Present Illness   Pt is a 63 yr old male who presented 08/08/24 due to lumbar radiculopathy. Pt s/p 08/08/24 XLIF and PLIF. PMH: GERD, HLD, HTN, prostate ca     Clinical Impressions Pt at PLOF lives alone but plans to have his brother and then nephew to assist. Pt requires to go up 5 steps with rail on both sides to enter his home. At this time mod I with use of bed rails for bed mobility, CGA for sit to stand and cues on BLE/ and hand placement. He was able to ambulate with RW with CGA within the hallway. Pt requires set up for UE  and mod assist for LE. At this time Acute Occupational Therapy to follow and recommendation for OP PT.     If plan is discharge home, recommend the following:   A little help with walking and/or transfers;A little help with bathing/dressing/bathroom;Assistance with cooking/housework;Help with stairs or ramp for entrance     Functional Status Assessment   Patient has had a recent decline in their functional status and demonstrates the ability to make significant improvements in function in a reasonable and predictable amount of time.     Equipment Recommendations   BSC/3in1 (RW)     Recommendations for Other Services         Precautions/Restrictions   Precautions Precautions: Fall;Back Precaution Booklet Issued: Yes (comment) Recall of Precautions/Restrictions: Intact Precaution/Restrictions Comments: pt able to report 2/3 prior to education Restrictions Weight Bearing Restrictions Per Provider Order: No     Mobility Bed Mobility Overal bed mobility: Needs Assistance Bed Mobility: Rolling, Supine to Sit Rolling: Modified independent (Device/Increase time)   Supine to sit: Modified independent (Device/Increase time)     General bed mobility comments: with use of rail    Transfers Overall  transfer level: Needs assistance Equipment used: Rolling walker (2 wheels) Transfers: Sit to/from Stand Sit to Stand: Contact guard assist           General transfer comment: needs cues on BLE position as had a posterior tilt onto heels      Balance Overall balance assessment: Needs assistance Sitting-balance support: Feet supported Sitting balance-Leahy Scale: Good     Standing balance support: Single extremity supported, Bilateral upper extremity supported Standing balance-Leahy Scale: Fair Standing balance comment: does better with RW                           ADL either performed or assessed with clinical judgement   ADL Overall ADL's : Needs assistance/impaired Eating/Feeding: Independent;Sitting   Grooming: Wash/dry hands;Set up;Sitting   Upper Body Bathing: Set up;Sitting   Lower Body Bathing: Moderate assistance;Sitting/lateral leans;Sit to/from stand   Upper Body Dressing : Set up;Sitting   Lower Body Dressing: Moderate assistance;Sit to/from stand   Toilet Transfer: Contact guard assist;Rolling walker (2 wheels)   Toileting- Clothing Manipulation and Hygiene: Contact guard assist;Sit to/from stand       Functional mobility during ADLs: Contact guard assist;Rolling walker (2 wheels);Cueing for sequencing;Cueing for safety       Vision Baseline Vision/History: 1 Wears glasses Ability to See in Adequate Light: 0 Adequate Patient Visual Report: No change from baseline Vision Assessment?: Wears glasses for reading;Wears glasses for driving     Perception Perception: Within Functional Limits       Praxis Praxis: [facility]  Pertinent Vitals/Pain Pain Assessment Pain Assessment: No/denies pain     Extremity/Trunk Assessment Upper Extremity Assessment Upper Extremity Assessment: Overall WFL for tasks assessed   Lower Extremity Assessment Lower Extremity Assessment: Defer to PT evaluation   Cervical / Trunk Assessment Cervical /  Trunk Assessment: Back Surgery   Communication Communication Communication: No apparent difficulties   Cognition Arousal: Alert Behavior During Therapy: WFL for tasks assessed/performed Cognition: No apparent impairments                               Following commands: Intact       Cueing  General Comments   Cueing Techniques: Verbal cues      Exercises     Shoulder Instructions      Home Living Family/patient expects to be discharged to:: Private residence Living Arrangements: Alone Available Help at Discharge: Family Type of Home: House Home Access: Stairs to enter Secretary/administrator of Steps: 5 Entrance Stairs-Rails: Right;Left;Can reach both Home Layout: One level     Bathroom Shower/Tub: Producer, television/film/video: Standard Bathroom Accessibility: Yes How Accessible: Accessible via walker Home Equipment: Shower seat          Prior Functioning/Environment Prior Level of Function : Working/employed;Driving             Mobility Comments: indep ADLs Comments: indep    OT Problem List: Decreased strength;Impaired balance (sitting and/or standing);Decreased activity tolerance;Decreased knowledge of use of DME or AE;Decreased safety awareness   OT Treatment/Interventions: Self-care/ADL training;Therapeutic exercise;DME and/or AE instruction;Therapeutic activities;Patient/family education;Balance training      OT Goals(Current goals can be found in the care plan section)   Acute Rehab OT Goals Patient Stated Goal: to go home OT Goal Formulation: With patient Time For Goal Achievement: 08/23/24 Potential to Achieve Goals: Fair   OT Frequency:  Min 2X/week    Co-evaluation              AM-PAC OT 6 Clicks Daily Activity     Outcome Measure Help from another person eating meals?: None Help from another person taking care of personal grooming?: None Help from another person toileting, which includes using toliet,  bedpan, or urinal?: A Little Help from another person bathing (including washing, rinsing, drying)?: A Little Help from another person to put on and taking off regular upper body clothing?: None Help from another person to put on and taking off regular lower body clothing?: A Little 6 Click Score: 21   End of Session Equipment Utilized During Treatment: Gait belt;Rolling walker (2 wheels) Nurse Communication: Mobility status  Activity Tolerance: Patient tolerated treatment well Patient left: in chair;with call bell/phone within reach  OT Visit Diagnosis: Unsteadiness on feet (R26.81);Other abnormalities of gait and mobility (R26.89);Repeated falls (R29.6);Muscle weakness (generalized) (M62.81)                Time: 9181-9149 OT Time Calculation (min): 32 min Charges:  OT General Charges $OT Visit: 1 Visit OT Evaluation $OT Eval Low Complexity: 1 Low OT Treatments $Self Care/Home Management : 8-22 mins  Warrick POUR OTR/L  Acute Rehab Services  [phone] office number   Warrick Berber 08/09/2024, 9:01 AM

## 2024-08-09 NOTE — Plan of Care (Signed)

## 2024-08-09 NOTE — TOC CM/SW Note (Addendum)
 Transition of Care [facility]) - Inpatient Brief Assessment   Patient Details  Name: Anthony Erickson MRN: [id number] Date of Birth: October 17, 1961  [facility] CM/SW Contact:    Lauraine FORBES Saa, LCSWA Phone Number: 08/09/2024, 10:53 AM   Clinical Narrative:  10:53 AM Per chart review, patient resides at home alone. Patient has a PCP and insurance. Patient does not have SNF/HH/DME history. Patient's preferred pharmacy is [facility]. OT recommended patient discharge with OPOT. PT recommendation remains pending evaluation. TOC will continue to follow.  Transition of Care Asessment: Insurance and Status: Insurance coverage has been reviewed Patient has primary care physician: Yes Home environment has been reviewed: Private Residence Prior level of function:: N/A Prior/Current Home Services: No current home services Social Drivers of Health Review: SDOH reviewed no interventions necessary Readmission risk has been reviewed: Yes (Currently Green 8%) Transition of care needs: transition of care needs identified, TOC will continue to follow

## 2024-08-09 NOTE — Evaluation (Signed)
 Physical Therapy Evaluation Patient Details Name: Anthony Erickson MRN: [id number] DOB: 1961-04-29 Today's Date: 08/09/2024  History of Present Illness  Pt is a 63 yr old male who presented 08/08/24 due to lumbar radiculopathy. Pt s/p 08/08/24 XLIF and PLIF. PMH: GERD, HLD, HTN, prostate ca  Clinical Impression  Pt underwent above surgery. Pt functioning at contact guard level and has progressed to amb without AD. Pt encouraged pt to use RW for longer distance ambulation as pt unable to maintain upright posture > 30 sec when amb without RW. Pt able to safely negotiate 5 steps to enter home. Pt reports brother and nephew will be helping him at home. Acute PT to cont to follow however I don't feel patient will need follow up PT upon immediate d/c. Pt could potential benefit from outpt PT once cleared by MD to improve core and back strength for safe return to work.        If plan is discharge home, recommend the following: Assist for transportation;Help with stairs or ramp for entrance;Assistance with cooking/housework   Can travel by private vehicle        Equipment Recommendations Rolling walker (2 wheels)  Recommendations for Other Services       Functional Status Assessment Patient has had a recent decline in their functional status and demonstrates the ability to make significant improvements in function in a reasonable and predictable amount of time.     Precautions / Restrictions Precautions Precautions: Fall;Back Precaution Booklet Issued: Yes (comment) Recall of Precautions/Restrictions: Intact Precaution/Restrictions Comments: Pt able to recall 3/3 back precautions Restrictions Weight Bearing Restrictions Per Provider Order: No      Mobility  Bed Mobility Overal bed mobility: Needs Assistance Bed Mobility: Rolling, Sidelying to Sit Rolling: Modified independent (Device/Increase time) Sidelying to sit: Modified independent (Device/Increase time)       General bed  mobility comments: increased time, HOB slightly elevated, reports having a lift chair at home in which he plans on sleeping in    Transfers Overall transfer level: Needs assistance Equipment used: Rolling walker (2 wheels) Transfers: Sit to/from Stand Sit to Stand: Contact guard assist           General transfer comment: needs cues on BLE position and to push up from bed    Ambulation/Gait Ambulation/Gait assistance: Supervision Gait Distance (Feet): 200 Feet Assistive device: None Gait Pattern/deviations: Step-through pattern, Decreased stride length, Trunk flexed Gait velocity: wfl for post surgery Gait velocity interpretation: 1.31 - 2.62 ft/sec, indicative of limited community ambulator   General Gait Details: pt with shorter step height and length compared to baseline. pt with slight trunk flexion, able to stand fully upright but unable to maintain > 30 sec  Stairs            Wheelchair Mobility     Tilt Bed    Modified Rankin (Stroke Patients Only)       Balance Overall balance assessment: Needs assistance Sitting-balance support: Feet supported Sitting balance-Leahy Scale: Good     Standing balance support: Single extremity supported, Bilateral upper extremity supported Standing balance-Leahy Scale: Fair Standing balance comment: does better with RW                             Pertinent Vitals/Pain Pain Assessment Pain Assessment: No/denies pain (had pain medicine about an hour ago)    Home Living Family/patient expects to be discharged to:: Private residence Living Arrangements: Alone Available Help at  Discharge: Family Type of Home: House Home Access: Stairs to enter Entrance Stairs-Rails: Right;Left;Can reach both Secretary/administrator of Steps: 5   Home Layout: One level Home Equipment: Shower seat Additional Comments: pt states his brother and nephew will be stopping in daily to check on him    Prior Function Prior Level  of Function : Working/employed;Driving             Mobility Comments: indep ADLs Comments: indep     Extremity/Trunk Assessment   Upper Extremity Assessment Upper Extremity Assessment: Overall WFL for tasks assessed    Lower Extremity Assessment Lower Extremity Assessment: Overall WFL for tasks assessed    Cervical / Trunk Assessment Cervical / Trunk Assessment: Back Surgery  Communication   Communication Communication: No apparent difficulties    Cognition Arousal: Alert Behavior During Therapy: WFL for tasks assessed/performed   PT - Cognitive impairments: No apparent impairments                         Following commands: Intact       Cueing Cueing Techniques: Verbal cues     General Comments General comments (skin integrity, edema, etc.): VSS    Exercises     Assessment/Plan    PT Assessment Patient needs continued PT services  PT Problem List Decreased strength;Decreased balance;Decreased mobility;Decreased activity tolerance       PT Treatment Interventions DME instruction;Gait training;Stair training;Functional mobility training;Therapeutic activities;Therapeutic exercise;Balance training    PT Goals (Current goals can be found in the Care Plan section)  Acute Rehab PT Goals Patient Stated Goal: home friday PT Goal Formulation: With patient Time For Goal Achievement: 08/23/24 Potential to Achieve Goals: Good    Frequency Min 5X/week     Co-evaluation               AM-PAC PT 6 Clicks Mobility  Outcome Measure Help needed turning from your back to your side while in a flat bed without using bedrails?: None Help needed moving from lying on your back to sitting on the side of a flat bed without using bedrails?: None Help needed moving to and from a bed to a chair (including a wheelchair)?: None Help needed standing up from a chair using your arms (e.g., wheelchair or bedside chair)?: A Little Help needed to walk in  hospital room?: A Little Help needed climbing 3-5 steps with a railing? : A Little 6 Click Score: 21    End of Session Equipment Utilized During Treatment: Gait belt Activity Tolerance: Patient tolerated treatment well Patient left: in chair;with call bell/phone within reach Nurse Communication: Mobility status PT Visit Diagnosis: Difficulty in walking, not elsewhere classified (R26.2)    Time: 8688-8672 PT Time Calculation (min) (ACUTE ONLY): 16 min   Charges:   PT Evaluation $PT Eval Moderate Complexity: 1 Mod   PT General Charges $$ ACUTE PT VISIT: 1 Visit         Norene Ames, PT, DPT Acute Rehabilitation Services Secure chat preferred Office #: [phone]   Norene CHRISTELLA Ames 08/09/2024, 2:01 PM

## 2024-08-09 NOTE — TOC Initial Note (Signed)
 Transition of Care [facility]) - Initial/Assessment Note    Patient Details  Name: Anthony Erickson MRN: [id number] Date of Birth: 1961/04/02  Transition of Care [facility] CM/SW Contact:    Roxie KANDICE Stain, RN Phone Number: 08/09/2024, 3:34 PM  Clinical Narrative:                 Spoke to patient regarding transition needs. Patient lives alone but will have family support. Patient agreeable to OP rehab. Referral sent to [facility]. DME ordered from [employer].  ICM (Inpatient Care Management) will continue to follow.  Expected Discharge Plan: OP Rehab Barriers to Discharge: Continued Medical Work up   Patient Goals and CMS Choice Patient states their goals for this hospitalization and ongoing recovery are:: return home CMS Medicare.gov Compare Post Acute Care list provided to:: Patient Choice offered to / list presented to : Patient      Expected Discharge Plan and Services   Discharge Planning Services: CM Consult Post Acute Care Choice: Durable Medical Equipment Living arrangements for the past 2 months: Single Family Home                 DME Arranged: Bedside commode DME Agency: [employer] Date DME Agency Contacted: 08/09/24 Time DME Agency Contacted: [phone] Representative spoke with at DME Agency: [place]            Prior Living Arrangements/Services Living arrangements for the past 2 months: Single Family Home Lives with:: Self Patient language and need for interpreter reviewed:: Yes Do you feel safe going back to the place where you live?: Yes      Need for Family Participation in Patient Care: Yes (Comment) Care giver support system in place?: Yes (comment)   Criminal Activity/Legal Involvement Pertinent to Current Situation/Hospitalization: No - Comment as needed  Activities of Daily Living      Permission Sought/Granted         Permission granted to share info w AGENCY: OP rehab, DME        Emotional Assessment   Attitude/Demeanor/Rapport:  Gracious, Engaged Affect (typically observed): Accepting Orientation: : Oriented to Place, Oriented to  Time, Oriented to Self, Oriented to Situation Alcohol / Substance Use: Not Applicable Psych Involvement: No (comment)  Admission diagnosis:  Lumbar radiculopathy [M54.16] Status post lumbar spinal fusion [Z98.1] Patient Active Problem List   Diagnosis Date Noted   Status post lumbar spinal fusion 08/08/2024   Lumbar radiculopathy 08/08/2024   Pre-op exam 06/08/2024   Encounter for immunization 06/08/2024   Chronic right-sided low back pain with bilateral sciatica 10/28/2023   Neck pain on right side 10/28/2023   Annual visit for general adult medical examination with abnormal findings 05/27/2023   Cervical spondylosis with myelopathy and radiculopathy 05/27/2023   Trigger thumb of left hand 10/06/2022   Left hand pain 10/06/2022   Hypersomnolence 09/11/2021   Foot pain, bilateral 06/24/2021   Vitamin D  deficiency 06/24/2021   Headache 05/05/2021   Anxiety 05/05/2021   Essential hypertension 12/30/2020   Idiopathic peripheral neuropathy 12/30/2020   Vertigo 08/19/2020   IGT (impaired glucose tolerance) 07/01/2020   Chondromalacia patellae, left knee    Morbid obesity (HCC) 05/27/2019   Left anterior knee pain 08/22/2018   Vision loss, bilateral 05/06/2017   Primary insomnia 12/13/2016   Neck pain on left side 05/18/2016   Lumbar pain with radiation down both legs 05/18/2016   Seasonal allergies 11/22/2014   Adenocarcinoma of prostate (HCC) 08/27/2011   Dyslipidemia 05/02/2008   GERD 05/02/2008  PCP:  Antonetta Rollene BRAVO, MD Pharmacy:   [facility] [address] [place] [address] Phone: [phone] Fax: [phone]     Social Drivers of Health (SDOH) Social History: SDOH Screenings   Food Insecurity: No Food Insecurity (08/09/2024)  Housing: Low Risk  (08/09/2024)  Transportation Needs: No Transportation Needs  (08/09/2024)  Utilities: Not At Risk (08/09/2024)  Alcohol Screen: Low Risk  (05/31/2024)  Depression (PHQ2-9): Low Risk  (01/20/2024)  Financial Resource Strain: Low Risk  (05/31/2024)  Physical Activity: Insufficiently Active (05/31/2024)  Social Connections: Socially Isolated (05/31/2024)  Stress: No Stress Concern Present (05/31/2024)  Tobacco Use: Low Risk  (08/08/2024)   SDOH Interventions:     Readmission Risk Interventions     No data to display

## 2024-08-10 ENCOUNTER — Encounter (HOSPITAL_COMMUNITY): Payer: Self-pay | Admitting: Orthopedic Surgery

## 2024-08-10 MED ORDER — MAGNESIUM CITRATE PO SOLN
1.0000 | Freq: Once | ORAL | Status: AC
  Administered 2024-08-10: 1 via ORAL
  Filled 2024-08-10: qty 296

## 2024-08-10 NOTE — Progress Notes (Signed)
 Mobility Specialist Progress Note:    08/10/24 1046  Mobility  Activity Ambulated with assistance (In hallway\)  Level of Assistance Standby assist, set-up cues, supervision of patient - no hands on  Assistive Device Front wheel walker;None  Distance Ambulated (ft) 200 ft  Activity Response Tolerated well  Mobility Referral Yes  Mobility visit 1 Mobility  Mobility Specialist Start Time (ACUTE ONLY) [id number]  Mobility Specialist Stop Time (ACUTE ONLY) [id number]  Mobility Specialist Time Calculation (min) (ACUTE ONLY) 10 min   Received pt in bed and agreeable to mobility. No physical assistance required. Pt c/o back pain, otherwise tolerated well. Returned to room without fault. Pt left in chair. Personal belongings and call light within reach. All needs met.  Lavanda Pollack Mobility Specialist  Please contact via [employer] or  Rehab Office [phone]

## 2024-08-10 NOTE — Plan of Care (Signed)

## 2024-08-10 NOTE — Progress Notes (Signed)
 Orthopedic Surgery Post-operative Progress Note  Assessment: Patient is a 63 y.o. male who is currently admitted after undergoing L4/5 XLIF and PSIF   Plan: -Operative plans complete -Drain: none -Out of bed as tolerated, no brace -No bending/lifting/twisting greater than 10 pounds -OT evaluate and treat -Pain control -Diabetic diet (I know patient is not diabetic but [facility]'s diabetic still allow for orange juice, cake, and pancakes so I feel that he will still get enough carbohydrates post-operatively)   -No chemoprophylaxis for dvt or antiplatelets for 72 hours after surgery -Ancef  x2 post-operative doses -Anticipate discharge to home tomorrow  ___________________________________________________________________________   Subjective: No acute events overnight. Pain well controlled. No radiating leg pain. Feels he is doing well.   Objective:  General: no acute distress, appropriate affect Neurologic: alert, answering questions appropriately, following commands Respiratory: unlabored breathing on room air Skin: dressings clear/dry/intact  MSK (spine):  -Strength exam      Right  Left  EHL    5/5  5/5 TA    5/5  5/5 GSC    5/5  5/5 Knee extension  5/5  5/5 Hip flexion   5/5  5/5  -Sensory exam    Sensation intact to light touch in L3-S1 nerve distributions of bilateral lower extremities   Yesterday's total administered Morphine Milligram Equivalents: 7.5   Patient name: Anthony Erickson Patient MRN: [id number] Date: 08/10/24

## 2024-08-10 NOTE — Progress Notes (Signed)
 Physical Therapy Treatment Patient Details Name: Anthony Erickson MRN: [id number] DOB: 1961-04-18 Today's Date: 08/10/2024   History of Present Illness Pt is a 63 yr old male who presented 08/08/24 due to lumbar radiculopathy. Pt s/p 08/08/24 XLIF and PLIF. PMH: GERD, HLD, HTN, prostate ca    PT Comments  Pt progressing well towards goals. Currently pt is Mod I for sit to stand from various surfaces without an AD, Mod I for gait without an AD and was supervision for stairs per home set up after demonstration/verbal education on how to ascend/descend stairs safely. Recommending to follow physician recommendation for physical therapy once discharged from acute care hospital setting.   Pt will benefit from continued skilled physical therapy services in acute care hospital setting at this time.        If plan is discharge home, recommend the following: Assist for transportation;Help with stairs or ramp for entrance;Assistance with cooking/housework     Equipment Recommendations  Rolling walker (2 wheels)       Precautions / Restrictions Precautions Precautions: Fall;Back Precaution Booklet Issued: Yes (comment) Precaution/Restrictions Comments: Pt able to recall 3/3 back precautions Restrictions Weight Bearing Restrictions Per Provider Order: No     Mobility  Bed Mobility   General bed mobility comments: pt in bathroom upon arrival and in recliner at end of session    Transfers Overall transfer level: Modified independent Equipment used: None Transfers: Sit to/from Stand Sit to Stand: Modified independent (Device/Increase time)           General transfer comment: pt standing at Mod I from toilet, EOB and recliner    Ambulation/Gait Ambulation/Gait assistance: Modified independent (Device/Increase time) Gait Distance (Feet): 250 Feet Assistive device: None Gait Pattern/deviations: Step-through pattern, Decreased stride length Gait velocity: slightly decreased Gait velocity  interpretation: 1.31 - 2.62 ft/sec, indicative of limited community ambulator   General Gait Details: pt with shorter step height and length compared to baseline.   Stairs Stairs: Yes Stairs assistance: Supervision Stair Management: Two rails, Step to pattern, Forwards Number of Stairs: 5 General stair comments: per home set up, supervision provided for safety, no significant deficits     Balance Overall balance assessment: No apparent balance deficits (not formally assessed), Modified Independent Sitting-balance support: Feet supported Sitting balance-Leahy Scale: Good     Standing balance support: No upper extremity supported, During functional activity Standing balance-Leahy Scale: Fair Standing balance comment: slight increase in BOS, no loss of balance      Communication Communication Communication: No apparent difficulties  Cognition Arousal: Alert Behavior During Therapy: WFL for tasks assessed/performed   PT - Cognitive impairments: No apparent impairments     Following commands: Intact      Cueing Cueing Techniques: Verbal cues     General Comments General comments (skin integrity, edema, etc.): VSS on room air      Pertinent Vitals/Pain Pain Assessment Pain Assessment: No/denies pain     PT Goals (current goals can now be found in the care plan section) Acute Rehab PT Goals Patient Stated Goal: home friday PT Goal Formulation: With patient Time For Goal Achievement: 08/23/24 Potential to Achieve Goals: Good Progress towards PT goals: Progressing toward goals    Frequency    Min 1X/week      PT Plan  Continue with current POC        AM-PAC PT 6 Clicks Mobility   Outcome Measure  Help needed turning from your back to your side while in a flat bed without using  bedrails?: None Help needed moving from lying on your back to sitting on the side of a flat bed without using bedrails?: None Help needed moving to and from a bed to a chair  (including a wheelchair)?: None Help needed standing up from a chair using your arms (e.g., wheelchair or bedside chair)?: None Help needed to walk in hospital room?: None Help needed climbing 3-5 steps with a railing? : A Little 6 Click Score: 23    End of Session Equipment Utilized During Treatment: Gait belt Activity Tolerance: Patient tolerated treatment well Patient left: in chair;with call bell/phone within reach Nurse Communication: Mobility status PT Visit Diagnosis: Difficulty in walking, not elsewhere classified (R26.2)     Time: 8583-8560 PT Time Calculation (min) (ACUTE ONLY): 23 min  Charges:    $Gait Training: 8-22 mins $Therapeutic Activity: 8-22 mins PT General Charges $$ ACUTE PT VISIT: 1 Visit                     Dorothyann Maier, DPT, CLT  Acute Rehabilitation Services Office: [phone] (Secure chat preferred)    Dorothyann VEAR Maier 08/10/2024, 3:06 PM

## 2024-08-11 ENCOUNTER — Other Ambulatory Visit (HOSPITAL_COMMUNITY): Payer: Self-pay

## 2024-08-11 MED ORDER — PHENYLEPHRINE HCL-NACL 20-0.9 MG/250ML-% IV SOLN
INTRAVENOUS | Status: AC
  Filled 2024-08-11: qty 500

## 2024-08-11 MED ORDER — METHOCARBAMOL 500 MG PO TABS
500.0000 mg | ORAL_TABLET | Freq: Four times a day (QID) | ORAL | 0 refills | Status: AC
  Filled 2024-08-11: qty 40, 10d supply, fill #0

## 2024-08-11 MED ORDER — POLYETHYLENE GLYCOL 3350 17 GM/SCOOP PO POWD
17.0000 g | Freq: Every day | ORAL | 0 refills | Status: AC
  Filled 2024-08-11: qty 238, 14d supply, fill #0

## 2024-08-11 MED ORDER — HYDROCODONE-ACETAMINOPHEN 5-325 MG PO TABS
1.0000 | ORAL_TABLET | Freq: Four times a day (QID) | ORAL | 0 refills | Status: AC | PRN
  Filled 2024-08-11: qty 20, 5d supply, fill #0

## 2024-08-11 MED ORDER — SENNA 8.6 MG PO TABS
1.0000 | ORAL_TABLET | Freq: Two times a day (BID) | ORAL | 0 refills | Status: AC
  Filled 2024-08-11: qty 28, 14d supply, fill #0

## 2024-08-11 NOTE — Progress Notes (Signed)
 Orthopedic Surgery Post-operative Progress Note  Assessment: Patient is a 63 y.o. male who is currently admitted after undergoing L4/5 XLIF and PSIF   Plan: -Operative plans complete -Drain: none -Out of bed as tolerated, no brace -No bending/lifting/twisting greater than 10 pounds -OT evaluate and treat -Pain control -Diabetic diet (I know patient is not diabetic but [facility]'s diabetic still allow for orange juice, cake, and pancakes so I feel that he will still get enough carbohydrates post-operatively)   -No chemoprophylaxis for dvt or antiplatelets for 72 hours after surgery -Ancef  x2 post-operative doses -Anticipate discharge to home today  ___________________________________________________________________________   Subjective: No acute events overnight. Pain well controlled. No radiating leg pain this morning. Answered his questions about post-operative care and instructions.   Objective:  General: no acute distress, appropriate affect Neurologic: alert, answering questions appropriately, following commands Respiratory: unlabored breathing on room air Skin: dressings clear/dry/intact  MSK (spine):  -Strength exam      Right  Left  EHL    5/5  5/5 TA    5/5  5/5 GSC    5/5  5/5 Knee extension  5/5  5/5 Hip flexion   5/5  5/5  -Sensory exam    Sensation intact to light touch in L3-S1 nerve distributions of bilateral lower extremities   Yesterday's total administered Morphine Milligram Equivalents: 7.5   Patient name: Anthony Erickson Patient MRN: [id number] Date: 08/11/24

## 2024-08-11 NOTE — Progress Notes (Signed)
 Reviewed AVS, patient expressed understanding of medications, MD follow up reviewed.   Removed IV, Site clean, dry and intact.  See LDA for information on wounds at discharge. Patient states all belongings brought to the hospital at time of admission are accounted for and packed to take home.  Picked up medications from [facility] pharmacy. [facility] contacted to transport patient to Discharge lounge to wait for transportation home.  Notified SW/CM to deliver DME equipment to DC lounge.

## 2024-08-11 NOTE — Plan of Care (Signed)

## 2024-08-11 NOTE — Discharge Summary (Signed)
 Orthopedic Surgery Discharge Summary  Patient name: Anthony Erickson Patient MRN: [id number] Admit today: 08/08/2024 Discharge date: 08/11/2024  Attending physician: Ozell Ada, MD Final diagnosis: lumbar radiculopathy Findings:  L4/5 spondylolisthesis, loss of disc height at L4/5, hypertrophic L4/5 facets   Hospital course: Patient is a 63 y.o. male who was admitted after undergoing L4/5 lateral interbody fusion and posterior instrumented spinal fusion. The patient had significant pain immediately after surgery, but pain eventually was controlled with a multimodal regimen including oxycodone . Labs during the hospitalization revealed no significant electrolyte abnormalities or anemia. The patient worked with physical therapy who recommended discharge to home. The patient was tolerating an oral diet without issue and was voiding spontaneously after surgery. The patient's vitals were stable on the day of discharge. The patient's drains were removed on the day of discharge. The patient was medically ready for discharge and was discharge to home on post-operative day three.  Instructions:   Orthopedic Surgery Discharge Instructions  Patient name: Anthony Erickson Procedure Performed: L4/5 lateral interbody fusion, L4/5 posterior instrumented spinal fusion Date of Surgery: 08/08/2024 Surgeon: Ozell Ada, MD  Pre-operative Diagnosis: lumbar radiculopathy Post-operative Diagnosis: same as above  Discharged to: home Discharge Condition: stable  Activity: You should refrain from bending, lifting, or twisting with objects greater than ten pounds until three months after surgery. You are encouraged to walk as much as desired. You can perform household activities such as cleaning dishes, doing laundry, vacuuming, etc. as long as the ten-pound restriction is followed. You do not need to wear a brace during the post-operative period.   Incision Care: Your incisions have dressings over them. The  dressings should remain in place and dry at all times for a total of one week after surgery. After one week, you can remove the dressings. Underneath the dressings, you will find skin glue. You should leave the skin glue in place. It will fall off with time. Do not pick, rub, or scrub at it. Do not put cream or lotion over the surgical area. After one week and once the dressings are off, it is okay to let soap and water  run over your incision. Again, do not pick, scrub, or rub at the skin glue when bathing. Do not submerge (e.g., take a bath, swim, go in a hot tub, etc.) until six weeks after surgery. There may be some bloody drainage from the incision into the dressing after surgery. This is normal. You do not need to replace the dressing. Continue to leave it in place for the one week as instructed above. Should the dressing become saturated with blood or drainage, please call the office for further instructions.   Medications: You have been prescribed Norco. This is a narcotic pain medication and should only be taken as prescribed. You should not drink alcohol or operate heavy machinery (including driving) while taking this medication. The Norco can cause constipation as a side effect. For that reason, you have been prescribed senna and miralax. These are both laxatives. You do not need to take this medication if you develop diarrhea. Should you remain constipated even while taking these medications, please increase the dose of miralax to twice daily. Tylenol  has been prescribed to be taken every 8 hours, which will give you additional pain relief. Robaxin is a muscle relaxer that has been prescribed to you for muscle spasm type pain. Take this medication as needed.   Do not take NSAIDs (ibuprofen , Aleve , Celebrex , naproxen , meloxicam , etc.) for the first 6 weeks  after surgery as there is some evidence that their use may decrease the chances of successful fusion.   In order to set expectations for opioid  prescriptions, you will only be prescribed opioids for a total of six weeks after surgery and, at two-weeks after surgery, your opioid prescription will start to tapered (decreased dosage and number of pills). If you have ongoing need for opioid medication six weeks after surgery, you will be referred to pain management. If you are already established with a provider that is giving you opioid medications, you should schedule an appointment with them for six weeks after surgery if you feel you are going to need another prescription. State law only allows for opioid prescriptions one week at a time. If you are running out of opioid medication near the end of the week, please call the office during business hours before running out so I can send you another prescription.   You may resume any home blood thinners (warfarin, lovenox, apixaban, plavix, xarelto, etc) 72 hours after your surgery. Take these medications as they were previously prescribed.  Driving: You should not drive while taking narcotic pain medications. You should start getting back to driving slowly and you may want to try driving in a parking lot before doing anything more.   Diet: You are safe to resume your regular diet after surgery.   Reasons to Call the Office After Surgery: You should feel free to call the office with any concerns or questions you have in the post-operative period, but you should definitely notify the office if you develop: -shortness of breath, chest pain, or trouble breathing -excessive bleeding, drainage, redness, or swelling around the surgical site -fevers, chills, or pain that is getting worse with each passing day -persistent nausea or vomiting -new weakness in either leg -new or worsening numbness or tingling in either leg -numbness in the groin, bowel or bladder incontinence -other concerns about your surgery  Follow Up Appointments: You should have an office appointment scheduled for approximately two  weeks after surgery. If you do not remember when this appointment is or do not already have it scheduled, please call the office to schedule.   Office Information:  -Ozell Ada, MD -Phone number: [phone] -Address: [address]       [place], [place] [address]

## 2024-08-11 NOTE — Discharge Instructions (Addendum)
 Orthopedic Surgery Discharge Instructions  Patient name: Anthony Erickson Procedure Performed: L4/5 lateral interbody fusion, L4/5 posterior instrumented spinal fusion Date of Surgery: 08/08/2024 Surgeon: Ozell Ada, MD  Pre-operative Diagnosis: lumbar radiculopathy Post-operative Diagnosis: same as above  Discharged to: home Discharge Condition: stable  Activity: You should refrain from bending, lifting, or twisting with objects greater than ten pounds until three months after surgery. You are encouraged to walk as much as desired. You can perform household activities such as cleaning dishes, doing laundry, vacuuming, etc. as long as the ten-pound restriction is followed. You do not need to wear a brace during the post-operative period.   Incision Care: Your incisions have dressings over them. The dressings should remain in place and dry at all times for a total of one week after surgery. After one week, you can remove the dressings. Underneath the dressings, you will find skin glue. You should leave the skin glue in place. It will fall off with time. Do not pick, rub, or scrub at it. Do not put cream or lotion over the surgical area. After one week and once the dressings are off, it is okay to let soap and water  run over your incision. Again, do not pick, scrub, or rub at the skin glue when bathing. Do not submerge (e.g., take a bath, swim, go in a hot tub, etc.) until six weeks after surgery. There may be some bloody drainage from the incision into the dressing after surgery. This is normal. You do not need to replace the dressing. Continue to leave it in place for the one week as instructed above. Should the dressing become saturated with blood or drainage, please call the office for further instructions.   Medications: You have been prescribed Norco. This is a narcotic pain medication and should only be taken as prescribed. You should not drink alcohol or operate heavy machinery (including  driving) while taking this medication. The Norco can cause constipation as a side effect. For that reason, you have been prescribed senna and miralax. These are both laxatives. You do not need to take this medication if you develop diarrhea. Should you remain constipated even while taking these medications, please increase the dose of miralax to twice daily. Tylenol  has been prescribed to be taken every 8 hours, which will give you additional pain relief. Robaxin is a muscle relaxer that has been prescribed to you for muscle spasm type pain. Take this medication as needed.   Do not take NSAIDs (ibuprofen , Aleve , Celebrex , naproxen , meloxicam , etc.) for the first 6 weeks after surgery as there is some evidence that their use may decrease the chances of successful fusion.   In order to set expectations for opioid prescriptions, you will only be prescribed opioids for a total of six weeks after surgery and, at two-weeks after surgery, your opioid prescription will start to tapered (decreased dosage and number of pills). If you have ongoing need for opioid medication six weeks after surgery, you will be referred to pain management. If you are already established with a provider that is giving you opioid medications, you should schedule an appointment with them for six weeks after surgery if you feel you are going to need another prescription. State law only allows for opioid prescriptions one week at a time. If you are running out of opioid medication near the end of the week, please call the office during business hours before running out so I can send you another prescription.   You may resume  any home blood thinners (warfarin, lovenox, apixaban, plavix, xarelto, etc) 72 hours after your surgery. Take these medications as they were previously prescribed.  Driving: You should not drive while taking narcotic pain medications. You should start getting back to driving slowly and you may want to try driving in a  parking lot before doing anything more.   Diet: You are safe to resume your regular diet after surgery.   Reasons to Call the Office After Surgery: You should feel free to call the office with any concerns or questions you have in the post-operative period, but you should definitely notify the office if you develop: -shortness of breath, chest pain, or trouble breathing -excessive bleeding, drainage, redness, or swelling around the surgical site -fevers, chills, or pain that is getting worse with each passing day -persistent nausea or vomiting -new weakness in either leg -new or worsening numbness or tingling in either leg -numbness in the groin, bowel or bladder incontinence -other concerns about your surgery  Follow Up Appointments: You should have an office appointment scheduled for approximately two weeks after surgery. If you do not remember when this appointment is or do not already have it scheduled, please call the office to schedule.   Office Information:  -Ozell Ada, MD -Phone number: [phone] -Address: [address]       [place], [place] [address]

## 2024-08-11 NOTE — TOC Transition Note (Addendum)
 Transition of Care [facility]) - Discharge Note   Patient Details  Name: Anthony Erickson MRN: [id number] Date of Birth: 10/01/1961  [facility] CM/SW Contact:  Corean JAYSON Canary, RN Phone Number: 08/11/2024, 8:04 AM   Clinical Narrative:     Patient transitioning home today, walker from [employer], set up with OP PT  1200 Walker not with patient called Jermaine from [facility] will deliver to patients house.   Final next level of care: Home/Self Care Barriers to Discharge: No Barriers Identified   Patient Goals and CMS Choice Patient states their goals for this hospitalization and ongoing recovery are:: return home CMS Medicare.gov Compare Post Acute Care list provided to:: Patient Choice offered to / list presented to : Patient      Discharge Placement                       Discharge Plan and Services Additional resources added to the After Visit Summary for     Discharge Planning Services: CM Consult Post Acute Care Choice: Durable Medical Equipment          DME Arranged: Bedside commode DME Agency: [employer] Date DME Agency Contacted: 08/09/24 Time DME Agency Contacted: [phone] Representative spoke with at DME Agency: [place]            Social Drivers of Health (SDOH) Interventions SDOH Screenings   Food Insecurity: No Food Insecurity (08/09/2024)  Housing: Low Risk  (08/09/2024)  Transportation Needs: No Transportation Needs (08/09/2024)  Utilities: Not At Risk (08/09/2024)  Alcohol Screen: Low Risk  (05/31/2024)  Depression (PHQ2-9): Low Risk  (01/20/2024)  Financial Resource Strain: Low Risk  (05/31/2024)  Physical Activity: Insufficiently Active (05/31/2024)  Social Connections: Socially Isolated (05/31/2024)  Stress: No Stress Concern Present (05/31/2024)  Tobacco Use: Low Risk  (08/08/2024)     Readmission Risk Interventions     No data to display

## 2024-08-13 ENCOUNTER — Telehealth: Payer: Self-pay

## 2024-08-13 ENCOUNTER — Encounter: Payer: Self-pay | Admitting: Orthopedic Surgery

## 2024-08-13 NOTE — Telephone Encounter (Signed)
 Patient called [facility] as he discharged on Friday but has not received his DME. Notified Jermaine from [facility] to check into this and call the patient

## 2024-08-14 MED ORDER — MAGNESIUM CITRATE PO SOLN: 1.0000 | Freq: Once | ORAL | 0 refills | Status: AC

## 2024-08-14 NOTE — Addendum Note (Signed)
 Addended by: GEORGINA SHARPER on: 08/14/2024 08:23 AM   Modules accepted: Orders

## 2024-08-17 NOTE — Anesthesia Postprocedure Evaluation (Signed)
 Anesthesia Post Note  Patient: Anthony Erickson  Procedure(s) Performed: ANTERIOR LATERAL LUMBAR FUSION LUMBAR FOUR-FIVE POSTERIOR INSTRUMENTED SPINAL FUSION (Spine Lumbar) POSTERIOR LUMBAR FUSION FOUR-FIVE (Spine Lumbar) APPLICATION OF O-ARM     Patient location during evaluation: PACU Anesthesia Type: General Level of consciousness: awake and patient cooperative Pain management: pain level controlled Vital Signs Assessment: post-procedure vital signs reviewed and stable Respiratory status: spontaneous breathing, nonlabored ventilation, respiratory function stable and patient connected to nasal cannula oxygen Cardiovascular status: blood pressure returned to baseline and stable Postop Assessment: no apparent nausea or vomiting Anesthetic complications: no   No notable events documented.                 Codie Krogh

## 2024-08-21 ENCOUNTER — Other Ambulatory Visit (INDEPENDENT_AMBULATORY_CARE_PROVIDER_SITE_OTHER): Payer: Self-pay

## 2024-08-21 ENCOUNTER — Ambulatory Visit (INDEPENDENT_AMBULATORY_CARE_PROVIDER_SITE_OTHER): Admitting: Orthopedic Surgery

## 2024-08-21 DIAGNOSIS — Z981 Arthrodesis status: Secondary | ICD-10-CM | POA: Diagnosis not present

## 2024-08-21 NOTE — Progress Notes (Signed)
 Orthopedic Surgery Post-operative Office Visit  Procedure: L4/5 XLIF and PSIF Date of Surgery: 08/08/2024 (~2 weeks post-op)  Assessment: Patient is a 63 y.o. who is doing well after surgery   Plan: -Operative plans complete -Out of bed as tolerated, no brace -No bending/lifting/twisting greater than 10 pounds -Okay to let soap/water  run over incisions, but do not submerge -Pain management: Tylenol  as needed -Return to office in 4 weeks, x-rays needed at next visit: AP/lateral lumbar  ___________________________________________________________________________   Subjective: Patient has been doing well since surgery.  He has had improving back pain over time.  He said the only time he is really having pain now is when he is try to get comfortable going to sleep at night.  Once he gets to sleep, he does not have any further issues.  He has no pain radiating into either lower extremity.  He left the dressings on since surgery but has not noticed any drainage on them.  Objective:  General: no acute distress, appropriate affect Neurologic: alert, answering questions appropriately, following commands Respiratory: unlabored breathing on room air Skin: incisions are well approximated with no erythema, induration, active/expressible drainage  MSK (spine):  -Strength exam      Left  Right  EHL    5/5  5/5 TA    5/5  5/5 GSC    5/5  5/5 Knee extension  5/5  5/5 Hip flexion   5/5  5/5  -Sensory exam    Sensation intact to light touch in L3-S1 nerve distributions of bilateral lower extremities  Imaging: X-rays of the lumbar spine from 08/21/2024 were independently reviewed and interpreted, showing interbody device at L4/5 with an integrated screw.  Interbody appears in similar position to immediately postoperative films.  No lucency seen around the cage.  There is posterior instrumentation at L4 and L5.  Screws without any lucency around them.  No fracture or dislocation  seen.   Patient name: Anthony Erickson Patient MRN: [id number] Date of visit: 08/21/24

## 2024-08-31 ENCOUNTER — Encounter: Payer: Self-pay | Admitting: Orthopedic Surgery

## 2024-09-01 ENCOUNTER — Telehealth: Payer: Self-pay | Admitting: Orthopedic Surgery

## 2024-09-01 NOTE — Telephone Encounter (Signed)
 Pt submitted medical release form, [employer] forms faxed and pt paid $20

## 2024-09-01 NOTE — Telephone Encounter (Signed)
 [employer] form completed and faxed

## 2024-09-04 ENCOUNTER — Encounter: Payer: Self-pay | Admitting: Radiology

## 2024-09-12 ENCOUNTER — Ambulatory Visit (HOSPITAL_COMMUNITY)

## 2024-09-12 ENCOUNTER — Encounter (HOSPITAL_COMMUNITY): Payer: Self-pay

## 2024-09-15 ENCOUNTER — Other Ambulatory Visit (HOSPITAL_BASED_OUTPATIENT_CLINIC_OR_DEPARTMENT_OTHER): Payer: Self-pay

## 2024-09-15 ENCOUNTER — Other Ambulatory Visit (HOSPITAL_COMMUNITY): Payer: Self-pay

## 2024-09-15 ENCOUNTER — Ambulatory Visit (INDEPENDENT_AMBULATORY_CARE_PROVIDER_SITE_OTHER): Admitting: Family Medicine

## 2024-09-15 ENCOUNTER — Encounter: Payer: Self-pay | Admitting: Family Medicine

## 2024-09-15 VITALS — BP 120/80 | HR 93 | Resp 18 | Ht 66.0 in | Wt 235.1 lb

## 2024-09-15 DIAGNOSIS — R7302 Impaired glucose tolerance (oral): Secondary | ICD-10-CM

## 2024-09-15 DIAGNOSIS — E785 Hyperlipidemia, unspecified: Secondary | ICD-10-CM | POA: Diagnosis not present

## 2024-09-15 DIAGNOSIS — E559 Vitamin D deficiency, unspecified: Secondary | ICD-10-CM

## 2024-09-15 DIAGNOSIS — I10 Essential (primary) hypertension: Secondary | ICD-10-CM | POA: Diagnosis not present

## 2024-09-15 DIAGNOSIS — Z0001 Encounter for general adult medical examination with abnormal findings: Secondary | ICD-10-CM

## 2024-09-15 MED ORDER — VITAMIN D (ERGOCALCIFEROL) 1.25 MG (50000 UNIT) PO CAPS
50000.0000 [IU] | ORAL_CAPSULE | ORAL | 1 refills | Status: AC
  Filled 2024-09-15: qty 12, 84d supply, fill #0
  Filled 2024-09-29: qty 12, 84d supply, fill #1

## 2024-09-15 NOTE — Progress Notes (Signed)
   Anthony Erickson     MRN: [id number]      DOB: Dec 23, 1960  Chief Complaint  Patient presents with   Annual Exam    Cpe     HPI: Patient is in for annual physical exam. No other health concerns are expressed or addressed at the visit. Recent labs, if available are reviewed. Immunization is reviewed , and  updated if needed.    PE; BP 120/80   Pulse 93   Resp 18   Ht 5' 6 (1.676 m)   Wt 235 lb 1.3 oz (106.6 kg)   SpO2 97%   BMI 37.94 kg/m   Pleasant male, alert and oriented x 3, in no cardio-pulmonary distress. Afebrile. HEENT No facial trauma or asymetry. Sinuses non tender. EOMI External ears normal,  Neck: supple, no adenopathy,JVD or thyromegaly.No bruits.  Chest: Clear to ascultation bilaterally.No crackles or wheezes. Non tender to palpation  Cardiovascular system; Heart sounds normal,  S1 and  S2 ,no S3.  No murmur, or thrill. Apical beat not displaced Peripheral pulses normal.  Abdomen: Soft, non tender, no organomegaly or masses. No bruits. Bowel sounds normal. No guarding, tenderness or rebound.    Musculoskeletal exam: Full ROM of spine, hips , shoulders and knees. No deformity ,swelling or crepitus noted. No muscle wasting or atrophy.   Neurologic: Cranial nerves 2 to 12 intact. Power, tone ,sensation and reflexes normal throughout. No disturbance in gait. No tremor.  Skin: Intact, no ulceration, erythema , scaling or rash noted. Pigmentation normal throughout  Psych; Normal mood and affect. Judgement and concentration normal   Assessment & Plan:  Annual visit for general adult medical examination with abnormal findings Annual exam as documented. Counseling done  re healthy lifestyle involving commitment to 150 minutes exercise per week, heart healthy diet, and attaining healthy weight.The importance of adequate sleep also discussed. Regular seat belt use and home safety, is also discussed. Changes in health habits are decided on  by the patient with goals and time frames  set for achieving them. Immunization and cancer screening needs are specifically addressed at this visit.

## 2024-09-15 NOTE — Patient Instructions (Addendum)
 F/U in 6 months  New is once weekly vit d, sent to [facility] pharmacy  Thankful surgery was a success  Pls get fasting cBC, lipid, cmp and EGFr, TSH , VIt D and hBA1C 1 week before your 6 month follow up  Pls get covid vaccine at your pharmacy  CONGRATS on weight loss  Keep making healthy food choices  Thanks for choosing [facility], we consider it a privelige to serve you.

## 2024-09-15 NOTE — Assessment & Plan Note (Signed)

## 2024-09-16 ENCOUNTER — Other Ambulatory Visit (HOSPITAL_COMMUNITY): Payer: Self-pay

## 2024-09-17 ENCOUNTER — Encounter: Payer: Self-pay | Admitting: Family Medicine

## 2024-09-25 ENCOUNTER — Ambulatory Visit: Admitting: Orthopedic Surgery

## 2024-09-25 ENCOUNTER — Other Ambulatory Visit: Payer: Self-pay

## 2024-09-25 DIAGNOSIS — Z981 Arthrodesis status: Secondary | ICD-10-CM

## 2024-09-25 NOTE — Progress Notes (Signed)
 Orthopedic Surgery Post-operative Office Visit   Procedure: L4/5 XLIF and PSIF Date of Surgery: 08/08/2024 (~6 weeks post-op)   Assessment: Patient is a 63 y.o. who is doing well after surgery     Plan: -Operative plans complete -Out of bed as tolerated, no brace -No bending/lifting/twisting greater than 10 pounds -Okay to submerge the wound at this point -Pain management: Tylenol  as needed -Return to office in 6 weeks, x-rays needed at next visit: AP/lateral/flex/ex lumbar   ___________________________________________________________________________     Subjective: Patient is pleased with how he has been doing since surgery.  He is not having any significant back or radiating leg pain.  He is not taking any medications for pain.  He has been carrying a cane but is not regularly using it for ambulation.  Has not noticed any redness or drainage around his incisions.    Objective:   General: no acute distress, appropriate affect Neurologic: alert, answering questions appropriately, following commands Respiratory: unlabored breathing on room air Skin: incisions are well healed with no erythema, induration, active/expressible drainage   MSK (spine):   -Strength exam                                                   Left                  Right   EHL                              5/5                  5/5 TA                                 5/5                  5/5 GSC                             5/5                  5/5 Knee extension            5/5                  5/5 Hip flexion                    5/5                  5/5   -Sensory exam                           Sensation intact to light touch in L3-S1 nerve distributions of bilateral lower extremities   Imaging: XRs of the lumbar spine from 09/21/2024 were independently reviewed and interpreted, showing an interbody device in the L4/5 disc space.  There is a plate with a single screw associated with the interbody device.   Interbody appears in appropriate position.  No lucency seen around the associated screw.  There is posterior instrumentation at L4 and L5 bilaterally.  No lucency seen around the screws.  None screws are backed  out.  No fracture or dislocation seen.    Patient name: Anthony Erickson Patient MRN: [id number] Date of visit: 09/25/24

## 2024-09-29 ENCOUNTER — Other Ambulatory Visit (HOSPITAL_COMMUNITY): Payer: Self-pay

## 2024-10-13 ENCOUNTER — Other Ambulatory Visit: Payer: 59

## 2024-10-13 DIAGNOSIS — Z8546 Personal history of malignant neoplasm of prostate: Secondary | ICD-10-CM

## 2024-10-14 LAB — PSA: Prostate Specific Ag, Serum: <0.1 ng/mL (ref 0.0–4.0)

## 2024-10-16 ENCOUNTER — Ambulatory Visit: Payer: Self-pay

## 2024-10-19 ENCOUNTER — Ambulatory Visit: Payer: 59 | Admitting: Urology

## 2024-10-30 ENCOUNTER — Other Ambulatory Visit: Payer: Self-pay | Admitting: Family Medicine

## 2024-10-30 ENCOUNTER — Ambulatory Visit (INDEPENDENT_AMBULATORY_CARE_PROVIDER_SITE_OTHER): Admitting: Orthopedic Surgery

## 2024-10-30 ENCOUNTER — Other Ambulatory Visit (HOSPITAL_COMMUNITY): Payer: Self-pay

## 2024-10-30 ENCOUNTER — Other Ambulatory Visit: Payer: Self-pay

## 2024-10-30 DIAGNOSIS — Z981 Arthrodesis status: Secondary | ICD-10-CM

## 2024-10-30 MED ORDER — AMLODIPINE BESYLATE 5 MG PO TABS
5.0000 mg | ORAL_TABLET | Freq: Every day | ORAL | 0 refills | Status: AC
  Filled 2024-10-30: qty 90, 90d supply, fill #0

## 2024-10-30 NOTE — Progress Notes (Signed)
 Orthopedic Surgery Post-operative Office Visit   Procedure: L4/5 XLIF and PSIF Date of Surgery: 08/08/2024 (~3 months post-op)   Assessment: Patient is a 63 y.o. who is doing well after surgery     Plan: -No spine specific precautions at this point, gradually return to gull activity -Pain management: tylenol  as needed -Return to office in 3 months, x-rays needed at next visit: AP/lateral/flex/ex lumbar   ___________________________________________________________________________     Subjective: Patient has been doing well since he was last in the office.  He is not having any significant back or radiate leg pain.  He said he sometimes notices pain at night when he sleeping but if he changes position it goes away.  He has not had any consistent pain in his back.  He is interested in returning to work.     Objective:   General: no acute distress, appropriate affect Neurologic: alert, answering questions appropriately, following commands Respiratory: unlabored breathing on room air Skin: incisions are well healed   MSK (spine):   -Strength exam                                                   Left                  Right   EHL                              5/5                  5/5 TA                                 5/5                  5/5 GSC                             5/5                  5/5 Knee extension            5/5                  5/5 Hip flexion                    5/5                  5/5   -Sensory exam                           Sensation intact to light touch in L3-S1 nerve distributions of bilateral lower extremities   Imaging: XRs of lumbar spine from 10/30/2024 were independently reviewed and interpreted, showing posterior instrumentation at L4 and L5.  No lucency seen around the screws.  None screws backed out.  There is an interbody device at L4/5.  Interbody appears in appropriate position.  There is a screw that is associated with this interbody device.   No lucency seen around that screw.  No fracture or dislocation seen.  No evidence of instability on flexion/extension views.    Patient name: Anthony  ISAMAR Erickson Patient MRN: [id number] Date of visit: 10/30/2024

## 2024-11-09 ENCOUNTER — Encounter: Payer: Self-pay | Admitting: Orthopedic Surgery

## 2024-12-13 ENCOUNTER — Ambulatory Visit: Admitting: Urology

## 2025-01-29 ENCOUNTER — Ambulatory Visit: Admitting: Orthopedic Surgery

## 2025-03-15 ENCOUNTER — Ambulatory Visit: Admitting: Family Medicine
